# Patient Record
Sex: Female | Born: 1975 | Race: Black or African American | Hispanic: No | Smoking: Current every day smoker
Health system: Southern US, Community
[De-identification: ages and names within clinical notes are randomized; demographics above are authoritative.]

## PROBLEM LIST (undated history)

## (undated) ENCOUNTER — Inpatient Hospital Stay (HOSPITAL_COMMUNITY): Payer: Self-pay

## (undated) DIAGNOSIS — I10 Essential (primary) hypertension: Secondary | ICD-10-CM

## (undated) DIAGNOSIS — O3480 Maternal care for other abnormalities of pelvic organs, unspecified trimester: Secondary | ICD-10-CM

## (undated) DIAGNOSIS — N6009 Solitary cyst of unspecified breast: Secondary | ICD-10-CM

## (undated) DIAGNOSIS — R51 Headache: Secondary | ICD-10-CM

## (undated) DIAGNOSIS — O09299 Supervision of pregnancy with other poor reproductive or obstetric history, unspecified trimester: Secondary | ICD-10-CM

## (undated) DIAGNOSIS — Z975 Presence of (intrauterine) contraceptive device: Secondary | ICD-10-CM

## (undated) DIAGNOSIS — N938 Other specified abnormal uterine and vaginal bleeding: Secondary | ICD-10-CM

## (undated) DIAGNOSIS — Q369 Cleft lip, unilateral: Secondary | ICD-10-CM

## (undated) DIAGNOSIS — B999 Unspecified infectious disease: Secondary | ICD-10-CM

## (undated) DIAGNOSIS — M419 Scoliosis, unspecified: Secondary | ICD-10-CM

## (undated) DIAGNOSIS — D649 Anemia, unspecified: Secondary | ICD-10-CM

## (undated) DIAGNOSIS — O1490 Unspecified pre-eclampsia, unspecified trimester: Secondary | ICD-10-CM

## (undated) DIAGNOSIS — F419 Anxiety disorder, unspecified: Secondary | ICD-10-CM

## (undated) DIAGNOSIS — O139 Gestational [pregnancy-induced] hypertension without significant proteinuria, unspecified trimester: Secondary | ICD-10-CM

## (undated) DIAGNOSIS — R011 Cardiac murmur, unspecified: Secondary | ICD-10-CM

## (undated) DIAGNOSIS — R413 Other amnesia: Secondary | ICD-10-CM

## (undated) DIAGNOSIS — N83209 Unspecified ovarian cyst, unspecified side: Secondary | ICD-10-CM

## (undated) DIAGNOSIS — J45909 Unspecified asthma, uncomplicated: Secondary | ICD-10-CM

## (undated) HISTORY — DX: Presence of (intrauterine) contraceptive device: Z97.5

## (undated) HISTORY — DX: Unspecified infectious disease: B99.9

## (undated) HISTORY — DX: Gestational (pregnancy-induced) hypertension without significant proteinuria, unspecified trimester: O13.9

## (undated) HISTORY — DX: Solitary cyst of unspecified breast: N60.09

## (undated) HISTORY — DX: Other specified abnormal uterine and vaginal bleeding: N93.8

## (undated) HISTORY — DX: Unspecified pre-eclampsia, unspecified trimester: O14.90

## (undated) HISTORY — DX: Essential (primary) hypertension: I10

## (undated) HISTORY — DX: Unspecified asthma, uncomplicated: J45.909

## (undated) HISTORY — DX: Headache: R51

## (undated) HISTORY — DX: Unspecified ovarian cyst, unspecified side: N83.209

## (undated) HISTORY — DX: Other amnesia: R41.3

## (undated) HISTORY — DX: Maternal care for other abnormalities of pelvic organs, unspecified trimester: O34.80

## (undated) HISTORY — DX: Cleft lip, unilateral: Q36.9

## (undated) HISTORY — DX: Supervision of pregnancy with other poor reproductive or obstetric history, unspecified trimester: O09.299

---
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
## 2003-09-30 ENCOUNTER — Emergency Department (HOSPITAL_COMMUNITY): Admission: EM | Admit: 2003-09-30 | Discharge: 2003-09-30 | Payer: Self-pay | Admitting: Emergency Medicine

## 2006-08-29 ENCOUNTER — Emergency Department (HOSPITAL_COMMUNITY): Admission: EM | Admit: 2006-08-29 | Discharge: 2006-08-29 | Payer: Self-pay | Admitting: Emergency Medicine

## 2007-11-23 ENCOUNTER — Emergency Department (HOSPITAL_COMMUNITY): Admission: EM | Admit: 2007-11-23 | Discharge: 2007-11-24 | Payer: Self-pay | Admitting: Emergency Medicine

## 2007-11-25 ENCOUNTER — Emergency Department (HOSPITAL_COMMUNITY): Admission: EM | Admit: 2007-11-25 | Discharge: 2007-11-25 | Payer: Self-pay | Admitting: Emergency Medicine

## 2008-04-05 ENCOUNTER — Emergency Department (HOSPITAL_COMMUNITY): Admission: EM | Admit: 2008-04-05 | Discharge: 2008-04-05 | Payer: Self-pay | Admitting: Emergency Medicine

## 2008-04-08 ENCOUNTER — Emergency Department (HOSPITAL_COMMUNITY): Admission: EM | Admit: 2008-04-08 | Discharge: 2008-04-08 | Payer: Self-pay | Admitting: Family Medicine

## 2009-06-13 ENCOUNTER — Emergency Department (HOSPITAL_COMMUNITY): Admission: EM | Admit: 2009-06-13 | Discharge: 2009-06-13 | Payer: Self-pay | Admitting: Emergency Medicine

## 2010-07-02 ENCOUNTER — Encounter: Payer: Self-pay | Admitting: Surgery

## 2010-08-27 LAB — URINALYSIS, ROUTINE W REFLEX MICROSCOPIC
Bilirubin Urine: NEGATIVE
Leukocytes, UA: NEGATIVE
Nitrite: NEGATIVE
Urobilinogen, UA: 0.2 mg/dL (ref 0.0–1.0)
pH: 5.5 (ref 5.0–8.0)

## 2010-08-27 LAB — WET PREP, GENITAL: Yeast Wet Prep HPF POC: NONE SEEN

## 2010-08-27 LAB — DIFFERENTIAL
Basophils Absolute: 0 10*3/uL (ref 0.0–0.1)
Eosinophils Relative: 0 % (ref 0–5)
Monocytes Absolute: 0.4 10*3/uL (ref 0.1–1.0)
Neutrophils Relative %: 63 % (ref 43–77)

## 2010-08-27 LAB — BASIC METABOLIC PANEL
BUN: 8 mg/dL (ref 6–23)
Calcium: 8.9 mg/dL (ref 8.4–10.5)
Chloride: 106 mEq/L (ref 96–112)
GFR calc Af Amer: >60 mL/min (ref >60)
GFR calc non Af Amer: >60 mL/min (ref >60)
Glucose, Bld: 99 mg/dL (ref 70–99)
Potassium: 3.9 mEq/L (ref 3.5–5.1)

## 2010-08-27 LAB — CBC
Hemoglobin: 15.1 g/dL — ABNORMAL HIGH (ref 12.0–15.0)
Platelets: 226 10*3/uL (ref 150–400)
RBC: 5.02 MIL/uL (ref 3.87–5.11)
WBC: 5.1 10*3/uL (ref 4.0–10.5)

## 2010-08-27 LAB — URINE MICROSCOPIC-ADD ON

## 2010-08-27 LAB — POCT PREGNANCY, URINE: Preg Test, Ur: NEGATIVE

## 2010-10-24 NOTE — Op Note (Signed)
Anne Olson, Anne Olson              ACCOUNT NO.:  [id number]   MEDICAL RECORD NO.:  [id number]          PATIENT TYPE:  EMS   LOCATION:  ED                           FACILITY:  [facility]   PHYSICIAN:  Velora Heckler, MD      DATE OF BIRTH:  08/20/1975   DATE OF PROCEDURE:  04/05/2008  DATE OF DISCHARGE:                               OPERATIVE REPORT   PREOPERATIVE DIAGNOSIS:  Left breast abscess.   POSTOPERATIVE DIAGNOSIS:  Left breast abscess.   PROCEDURE:  Incision and drainage and open packing, left breast abscess.   SURGEON:  Velora Heckler, MD, FACS   ANESTHESIA:  2% lidocaine local.   ESTIMATED BLOOD LOSS:  Minimal.   PREPARATION:  Betadine.   COMPLICATIONS:  None.   INDICATIONS:  The patient is 35 year old black female which presents to  [facility] Emergency Department with 1-week history of left breast  swelling and pain.  The patient was evaluated in the emergency room and  found to have a left breast abscess.  General surgery was called for  management.   BODY OF THE REPORT:  The procedure was done at the bedside in the  emergency department.  The left breast is prepped with Betadine and  draped in the usual aseptic fashion.  Skin was anesthetized with 2%  lidocaine local.  A 1 cm incision is made over the abscess at the  inferior aspect of the left breast areola.  Copious amounts of brown  yellow pus were evacuated from the cavity.  The sample was submitted to  the laboratory for culture and sensitivity.  Cavity is packed with  quarter-inch Iodoform gauze packing and dry gauze dressings were placed  and secured with Hypafix tape.   The patient tolerated the procedure well.      Velora Heckler, MD  Electronically Signed     TMG/MEDQ  D:  04/05/2008  T:  04/05/2008  Job:  [id number]

## 2011-03-08 LAB — POCT I-STAT, CHEM 8
BUN: 3 — ABNORMAL LOW
Calcium, Ion: 1.11 — ABNORMAL LOW
HCT: 43
Potassium: 3.7
Sodium: 143

## 2011-03-08 LAB — POCT CARDIAC MARKERS
Myoglobin, poc: 22.5
Troponin i, poc: <0.05

## 2011-03-12 LAB — POCT I-STAT, CHEM 8
BUN: <3 — ABNORMAL LOW
Calcium, Ion: 1.17
Chloride: 106
Creatinine, Ser: 0.9
Glucose, Bld: 99
Hemoglobin: 15
Sodium: 142
TCO2: 24

## 2011-03-12 LAB — DIFFERENTIAL
Basophils Absolute: 0
Basophils Relative: 0
Eosinophils Absolute: 0
Eosinophils Relative: 0
Monocytes Absolute: 0.5
Monocytes Relative: 5
Neutro Abs: 7.3

## 2011-03-12 LAB — CULTURE, ROUTINE-ABSCESS

## 2011-03-12 LAB — CBC
MCV: 92.3
Platelets: 261
RBC: 4.54

## 2011-05-12 HISTORY — PX: WISDOM TOOTH EXTRACTION: SHX21

## 2011-06-12 DIAGNOSIS — R413 Other amnesia: Secondary | ICD-10-CM

## 2011-06-12 DIAGNOSIS — O3480 Maternal care for other abnormalities of pelvic organs, unspecified trimester: Secondary | ICD-10-CM

## 2011-06-12 DIAGNOSIS — N83209 Unspecified ovarian cyst, unspecified side: Secondary | ICD-10-CM

## 2011-06-12 HISTORY — DX: Maternal care for other abnormalities of pelvic organs, unspecified trimester: O34.80

## 2011-06-12 HISTORY — DX: Unspecified ovarian cyst, unspecified side: N83.209

## 2011-06-12 HISTORY — DX: Other amnesia: R41.3

## 2011-12-27 ENCOUNTER — Inpatient Hospital Stay (HOSPITAL_COMMUNITY)
Admission: AD | Admit: 2011-12-27 | Discharge: 2011-12-28 | Disposition: A | Discharge status: Home / Self Care | Payer: 59 | Source: Ambulatory Visit | Attending: Family Medicine | Admitting: Family Medicine

## 2011-12-27 DIAGNOSIS — N760 Acute vaginitis: Secondary | ICD-10-CM

## 2011-12-27 DIAGNOSIS — O26899 Other specified pregnancy related conditions, unspecified trimester: Secondary | ICD-10-CM

## 2011-12-27 DIAGNOSIS — O34599 Maternal care for other abnormalities of gravid uterus, unspecified trimester: Secondary | ICD-10-CM | POA: Insufficient documentation

## 2011-12-27 DIAGNOSIS — R102 Pelvic and perineal pain unspecified side: Secondary | ICD-10-CM

## 2011-12-27 DIAGNOSIS — O2390 Unspecified genitourinary tract infection in pregnancy, unspecified trimester: Secondary | ICD-10-CM | POA: Insufficient documentation

## 2011-12-27 DIAGNOSIS — B9689 Other specified bacterial agents as the cause of diseases classified elsewhere: Secondary | ICD-10-CM | POA: Insufficient documentation

## 2011-12-27 DIAGNOSIS — N949 Unspecified condition associated with female genital organs and menstrual cycle: Secondary | ICD-10-CM | POA: Insufficient documentation

## 2011-12-27 DIAGNOSIS — R1032 Left lower quadrant pain: Secondary | ICD-10-CM | POA: Insufficient documentation

## 2011-12-27 DIAGNOSIS — A499 Bacterial infection, unspecified: Secondary | ICD-10-CM

## 2011-12-27 DIAGNOSIS — N83209 Unspecified ovarian cyst, unspecified side: Secondary | ICD-10-CM

## 2011-12-27 HISTORY — DX: Anemia, unspecified: D64.9

## 2011-12-27 HISTORY — DX: Cardiac murmur, unspecified: R01.1

## 2011-12-27 HISTORY — DX: Anxiety disorder, unspecified: F41.9

## 2011-12-28 ENCOUNTER — Encounter (HOSPITAL_COMMUNITY): Payer: Self-pay | Admitting: *Deleted

## 2011-12-28 ENCOUNTER — Inpatient Hospital Stay (HOSPITAL_COMMUNITY): Payer: Self-pay

## 2011-12-28 LAB — URINALYSIS, ROUTINE W REFLEX MICROSCOPIC
Glucose, UA: NEGATIVE mg/dL
Ketones, ur: NEGATIVE mg/dL
Nitrite: NEGATIVE
Urobilinogen, UA: 1 mg/dL (ref 0.0–1.0)
pH: 6 (ref 5.0–8.0)

## 2011-12-28 LAB — HCG, QUANTITATIVE, PREGNANCY: hCG, Beta Chain, Quant, S: 9660 m[IU]/mL — ABNORMAL HIGH (ref <5)

## 2011-12-28 LAB — ABO/RH: ABO/RH(D): O POS

## 2011-12-28 LAB — WET PREP, GENITAL: Trich, Wet Prep: NONE SEEN

## 2011-12-28 LAB — CBC WITH DIFFERENTIAL/PLATELET
Eosinophils Relative: 1 % (ref 0–5)
Hemoglobin: 12.4 g/dL (ref 12.0–15.0)
Lymphocytes Relative: 39 % (ref 12–46)
Lymphs Abs: 2.8 10*3/uL (ref 0.7–4.0)
MCV: 88.1 fL (ref 78.0–100.0)
Platelets: 202 10*3/uL (ref 150–400)
RBC: 4.21 MIL/uL (ref 3.87–5.11)
WBC: 7.1 10*3/uL (ref 4.0–10.5)

## 2011-12-28 MED ORDER — ACETAMINOPHEN 325 MG PO TABS
650.0000 mg | ORAL_TABLET | Freq: Once | ORAL | Status: AC
  Administered 2011-12-28: 650 mg via ORAL
  Filled 2011-12-28: qty 2

## 2011-12-28 MED ORDER — METRONIDAZOLE 500 MG PO TABS: 500.0000 mg | ORAL_TABLET | Freq: Two times a day (BID) | ORAL | Status: AC

## 2011-12-28 NOTE — MAU Provider Note (Signed)
History     CSN: [id number]  Arrival date and time: 12/27/11 2358   First Provider Initiated Contact with Patient 12/28/11 0047      Chief Complaint  Patient presents with  . Possible Pregnancy  . Urinary Frequency  . Abdominal Pain   HPI Anne Olson is a 36 y.o. female who presents to MAU for abdominal pain in early pregnancy. The abdominal pain is located in the left lower portion and it radiates to the left lower back. She rates the pain as 5/10. She denies nausea, vomiting or other problems. The history was provided by the patient.  OB History    Grav Para Term Preterm Abortions TAB SAB Ect Mult Living   3 1 1  0 1 1 0 0 0 1      Past Medical History  Diagnosis Date  . Heart murmur   . Anxiety   . Anemia     Past Surgical History  Procedure Date  . No past surgeries     Family History  Problem Relation Age of Onset  . Cancer Mother   . Cancer Father     History  Substance Use Topics  . Smoking status: Current Everyday Smoker -- 0.5 packs/day  . Smokeless tobacco: Not on file  . Alcohol Use: No    Allergies: No Known Allergies  No prescriptions prior to admission    ROS: As stated in HPI    Blood pressure 144/74, pulse 106, temperature 98.8 F (37.1 C), temperature source Oral, resp. rate 20, height 5\' 8"  (1.727 m), weight 120 lb (54.432 kg), last menstrual period 11/21/2011, SpO2 100.00%.  Physical Exam  Nursing note and vitals reviewed. Constitutional: She is oriented to person, place, and time. She appears well-developed and well-nourished. No distress.  HENT:  Head: Normocephalic and atraumatic.  Eyes: EOM are normal.  Neck: Neck supple.  Cardiovascular:       Tachycardia   Respiratory: Effort normal.  GI: Soft. There is tenderness in the left lower quadrant. There is no rigidity, no rebound, no guarding and no CVA tenderness.  Genitourinary:       External genitalia without lesions. Frothy discharge vaginal vault. Cervix long, closed,  no CMT. Left adnexal tenderness. Uterus slightly enlarged.  Musculoskeletal: Normal range of motion. She exhibits no edema.  Neurological: She is alert and oriented to person, place, and time.  Skin: Skin is warm and dry.  Psychiatric: She has a normal mood and affect. Her behavior is normal. Judgment and thought content normal.    Results for orders placed during the hospital encounter of 12/27/11 (from the past 24 hour(s))  URINALYSIS, ROUTINE W REFLEX MICROSCOPIC     Status: Abnormal   Collection Time   12/28/11 12:05 AM      Component Value Range   Color, Urine YELLOW  YELLOW   APPearance CLEAR  CLEAR   Specific Gravity, Urine >1.030 (*) 1.005 - 1.030   pH 6.0  5.0 - 8.0   Glucose, UA NEGATIVE  NEGATIVE mg/dL   Hgb urine dipstick NEGATIVE  NEGATIVE   Bilirubin Urine NEGATIVE  NEGATIVE   Ketones, ur NEGATIVE  NEGATIVE mg/dL   Protein, ur NEGATIVE  NEGATIVE mg/dL   Urobilinogen, UA 1.0  0.0 - 1.0 mg/dL   Nitrite NEGATIVE  NEGATIVE   Leukocytes, UA NEGATIVE  NEGATIVE  POCT PREGNANCY, URINE     Status: Abnormal   Collection Time   12/28/11 12:18 AM      Component Value Range  Preg Test, Ur POSITIVE (*) NEGATIVE  CBC WITH DIFFERENTIAL     Status: Normal   Collection Time   12/28/11 12:55 AM      Component Value Range   WBC 7.1  4.0 - 10.5 K/uL   RBC 4.21  3.87 - 5.11 MIL/uL   Hemoglobin 12.4  12.0 - 15.0 g/dL   HCT 16.1  09.6 - 04.5 %   MCV 88.1  78.0 - 100.0 fL   MCH 29.5  26.0 - 34.0 pg   MCHC 33.4  30.0 - 36.0 g/dL   RDW 40.9  81.1 - 91.4 %   Platelets 202  150 - 400 K/uL   Neutrophils Relative 51  43 - 77 %   Neutro Abs 3.6  1.7 - 7.7 K/uL   Lymphocytes Relative 39  12 - 46 %   Lymphs Abs 2.8  0.7 - 4.0 K/uL   Monocytes Relative 9  3 - 12 %   Monocytes Absolute 0.7  0.1 - 1.0 K/uL   Eosinophils Relative 1  0 - 5 %   Eosinophils Absolute 0.0  0.0 - 0.7 K/uL   Basophils Relative 0  0 - 1 %   Basophils Absolute 0.0  0.0 - 0.1 K/uL  HCG, QUANTITATIVE, PREGNANCY      Status: Abnormal   Collection Time   12/28/11 12:55 AM      Component Value Range   hCG, Beta Chain, Quant, S 9660 (*) <5 mIU/mL  ABO/RH     Status: Normal (Preliminary result)   Collection Time   12/28/11 12:55 AM      Component Value Range   ABO/RH(D) O POS    WET PREP, GENITAL     Status: Abnormal   Collection Time   12/28/11  1:25 AM      Component Value Range   Yeast Wet Prep HPF POC NONE SEEN  NONE SEEN   Trich, Wet Prep NONE SEEN  NONE SEEN   Clue Cells Wet Prep HPF POC MODERATE (*) NONE SEEN   WBC, Wet Prep HPF POC FEW (*) NONE SEEN   Ultrasound shows a 6 week viable [facility]UP [facility]  MAU Course  Procedures  Assessment and Plan  36 y.o. female @ [redacted] week gestation with pelvic pain Bacterial vaginosis Ovarian cyst left  Plan: Rx Flagyl  Tylenol prn pain  Start prenatal care, return as needed. NEESE,HOPE 12/28/2011, 2:00 AM

## 2011-12-28 NOTE — MAU Note (Signed)
Pt reports sharp pains in lower back and left side x 2 days. Positive home preg test x 2. LMP 11/21/2011. Frequent urination.

## 2011-12-28 NOTE — MAU Provider Note (Signed)
Chart reviewed and agree with management and plan.  

## 2011-12-28 NOTE — MAU Note (Signed)
Pt states she took 2 home pregnancy test. Pt complaining of abdominal pain and left side and lower back.

## 2012-01-09 ENCOUNTER — Telehealth: Payer: Self-pay | Admitting: Obstetrics and Gynecology

## 2012-01-09 NOTE — Telephone Encounter (Signed)
Bonnie/triage

## 2012-01-09 NOTE — Telephone Encounter (Signed)
Left msg on pt's voice mail to call back . I need a little more information from pt.

## 2012-01-09 NOTE — Telephone Encounter (Signed)
Spoke with pt rgd msg.pt  stated that has been exposed to shingles at her job. Pt is [redacted] weeks pregnant .told pt would consult with [facility] the midwife and call her back. Pt stated that it would br ok to leave information on pt's voice mail. bt cma

## 2012-01-10 ENCOUNTER — Encounter: Payer: Self-pay | Admitting: Obstetrics and Gynecology

## 2012-01-10 ENCOUNTER — Ambulatory Visit (INDEPENDENT_AMBULATORY_CARE_PROVIDER_SITE_OTHER): Payer: 59 | Admitting: Obstetrics and Gynecology

## 2012-01-10 VITALS — BP 124/56 | Resp 14 | Ht 68.0 in

## 2012-01-10 DIAGNOSIS — Z3490 Encounter for supervision of normal pregnancy, unspecified, unspecified trimester: Secondary | ICD-10-CM | POA: Insufficient documentation

## 2012-01-10 DIAGNOSIS — R5383 Other fatigue: Secondary | ICD-10-CM | POA: Insufficient documentation

## 2012-01-10 DIAGNOSIS — M549 Dorsalgia, unspecified: Secondary | ICD-10-CM | POA: Insufficient documentation

## 2012-01-10 DIAGNOSIS — L299 Pruritus, unspecified: Secondary | ICD-10-CM

## 2012-01-10 DIAGNOSIS — M545 Low back pain: Secondary | ICD-10-CM

## 2012-01-10 DIAGNOSIS — N83209 Unspecified ovarian cyst, unspecified side: Secondary | ICD-10-CM | POA: Insufficient documentation

## 2012-01-10 DIAGNOSIS — B029 Zoster without complications: Secondary | ICD-10-CM

## 2012-01-10 DIAGNOSIS — Z331 Pregnant state, incidental: Secondary | ICD-10-CM

## 2012-01-10 LAB — CBC
Platelets: 254 10*3/uL (ref 150–400)
RBC: 4.31 MIL/uL (ref 3.87–5.11)
WBC: 7.1 10*3/uL (ref 4.0–10.5)

## 2012-01-10 LAB — POCT URINALYSIS DIPSTICK
Glucose, UA: NEGATIVE
Nitrite, UA: NEGATIVE
Spec Grav, UA: 1.015
Urobilinogen, UA: NEGATIVE

## 2012-01-10 NOTE — Progress Notes (Signed)
C/O: Back and side pain since middle of month. OTC meds not working . Positive home preg. test on 12/26/2011. Exposure to shingles .Subjective: Patient reports no problems voiding but has had lt sided abdominal pain Left lower Quadrant and some back pain. Worried about exposure to shingles as she is working with the elderly and she is has been in contact with a patient who has an active outbreak in the past week. The patient has been seen at [facility] 12/27/11 for lower abdominal pain, at that visit she was diagnosed with an Ovarian Cyst and BV and a Positive Pregnancy Test. Patient has not established prenatal care as yet. Complains of fatigue. Objective: I have reviewed patient's vital signs, medications and labs.  General: alert and appears older than stated age. Affect is very flat but denies depression but only admits to stress of long hours at work. Lungs: bilaterally Clear CV: RRR Abdomen: Soft. Tender Left Lower Quadrant  c/w previous diagnosis of ovarian cyst. GU: U/A neg GI: normal and regular. External Genitalia: Normal not irritation or swelling. Piercing noted Right Labia Majora. Speculum examination: Vaginal walls appeared normal. Cx normal. Wet prep: Ph 4.0 no Whiff, TOC  No clue cells. Patient has completed course of Flagyl. Bimanual examination: Adnexa: Normal. Uterus: anteverted, [redacted]wks GA. Rectum: Normal   Assessment/Plan  Ovarian Cyst persists. Pregnant with no established PN Care. No anemia.  Plan: Blood work: CBC, CMP, Varicella titer - today Advised to use melatonin OTC to aid sleep and this may help exhaustion Advised to establish PN Care.   Earl Gala, CNM. 01/10/2012, 7:00 PM

## 2012-01-11 LAB — COMPREHENSIVE METABOLIC PANEL
ALT: 12 U/L (ref 0–35)
CO2: 19 mEq/L (ref 19–32)
Calcium: 9 mg/dL (ref 8.4–10.5)
Chloride: 104 mEq/L (ref 96–112)
Potassium: 3.9 mEq/L (ref 3.5–5.3)
Sodium: 138 mEq/L (ref 135–145)
Total Protein: 7 g/dL (ref 6.0–8.3)

## 2012-01-11 LAB — VARICELLA ZOSTER ANTIBODY, IGG: Varicella IgG: 4.38 {ISR} — ABNORMAL HIGH

## 2012-01-16 ENCOUNTER — Ambulatory Visit (INDEPENDENT_AMBULATORY_CARE_PROVIDER_SITE_OTHER): Payer: 59 | Admitting: Obstetrics and Gynecology

## 2012-01-16 DIAGNOSIS — Z331 Pregnant state, incidental: Secondary | ICD-10-CM

## 2012-01-16 DIAGNOSIS — Z3201 Encounter for pregnancy test, result positive: Secondary | ICD-10-CM

## 2012-01-16 LAB — POCT URINALYSIS DIPSTICK
Bilirubin, UA: NEGATIVE
Glucose, UA: NEGATIVE
Ketones, UA: NEGATIVE
Leukocytes, UA: NEGATIVE
Protein, UA: NEGATIVE
Spec Grav, UA: 1.01

## 2012-01-16 MED ORDER — CONCEPT DHA 53.5-38-1 MG PO CAPS: 1.0000 | ORAL_CAPSULE | Freq: Every day | ORAL | Status: DC

## 2012-01-16 NOTE — Progress Notes (Signed)
NOB interview completed.  Had pt complete a ROI form to get delivery records from delivery done @ [facility] in 1994.  Pt states had to be resuscitated, unsure what happened.  NOB work up sched on Wednesday 01/30/12 @ 1445 w/ ND.

## 2012-01-17 LAB — PRENATAL PANEL VII
Antibody Screen: NEGATIVE
Basophils Relative: 0 % (ref 0–1)
Eosinophils Absolute: 0 10*3/uL (ref 0.0–0.7)
Eosinophils Relative: 0 % (ref 0–5)
Hepatitis B Surface Ag: NEGATIVE
MCH: 29.2 pg (ref 26.0–34.0)
MCHC: 33.2 g/dL (ref 30.0–36.0)
Monocytes Relative: 10 % (ref 3–12)
Neutrophils Relative %: 56 % (ref 43–77)
Platelets: 246 10*3/uL (ref 150–400)

## 2012-01-18 ENCOUNTER — Telehealth: Payer: Self-pay | Admitting: Obstetrics and Gynecology

## 2012-01-18 LAB — HEMOGLOBINOPATHY EVALUATION
Hemoglobin Other: 0 %
Hgb A2 Quant: 2.7 % (ref 2.2–3.2)
Hgb A: 97.3 % (ref 96.8–97.8)

## 2012-01-18 NOTE — Telephone Encounter (Signed)
TC to pt. Was given restrictions letter for work at [employer] interview. Requesting letter to work more than 40 hours. Informed pt will need to discuss with provider at NOB W/U. Pt agreeable.

## 2012-01-30 ENCOUNTER — Encounter: Payer: 59 | Admitting: Obstetrics and Gynecology

## 2012-02-19 ENCOUNTER — Encounter: Payer: 59 | Admitting: Obstetrics and Gynecology

## 2012-03-06 ENCOUNTER — Inpatient Hospital Stay (HOSPITAL_COMMUNITY)
Admission: AD | Admit: 2012-03-06 | Discharge: 2012-03-06 | Disposition: A | Discharge status: Home / Self Care | Payer: 59 | Source: Ambulatory Visit | Attending: Obstetrics and Gynecology | Admitting: Obstetrics and Gynecology

## 2012-03-06 ENCOUNTER — Inpatient Hospital Stay (HOSPITAL_COMMUNITY): Payer: 59

## 2012-03-06 ENCOUNTER — Encounter (HOSPITAL_COMMUNITY): Payer: Self-pay | Admitting: *Deleted

## 2012-03-06 DIAGNOSIS — R109 Unspecified abdominal pain: Secondary | ICD-10-CM | POA: Insufficient documentation

## 2012-03-06 DIAGNOSIS — O99891 Other specified diseases and conditions complicating pregnancy: Secondary | ICD-10-CM | POA: Insufficient documentation

## 2012-03-06 DIAGNOSIS — R0602 Shortness of breath: Secondary | ICD-10-CM | POA: Insufficient documentation

## 2012-03-06 DIAGNOSIS — Z331 Pregnant state, incidental: Secondary | ICD-10-CM

## 2012-03-06 DIAGNOSIS — R079 Chest pain, unspecified: Secondary | ICD-10-CM

## 2012-03-06 DIAGNOSIS — M549 Dorsalgia, unspecified: Secondary | ICD-10-CM | POA: Insufficient documentation

## 2012-03-06 LAB — WET PREP, GENITAL
Trich, Wet Prep: NONE SEEN
Yeast Wet Prep HPF POC: NONE SEEN

## 2012-03-06 LAB — COMPREHENSIVE METABOLIC PANEL
AST: 12 U/L (ref 0–37)
Albumin: 3.1 g/dL — ABNORMAL LOW (ref 3.5–5.2)
Alkaline Phosphatase: 67 U/L (ref 39–117)
Chloride: 103 mEq/L (ref 96–112)
Potassium: 3.4 mEq/L — ABNORMAL LOW (ref 3.5–5.1)
Total Bilirubin: 0.1 mg/dL — ABNORMAL LOW (ref 0.3–1.2)
Total Protein: 6.6 g/dL (ref 6.0–8.3)

## 2012-03-06 LAB — CBC
MCHC: 34.3 g/dL (ref 30.0–36.0)
Platelets: 244 10*3/uL (ref 150–400)
RDW: 13.5 % (ref 11.5–15.5)
WBC: 10.4 10*3/uL (ref 4.0–10.5)

## 2012-03-06 LAB — URINALYSIS, ROUTINE W REFLEX MICROSCOPIC
Glucose, UA: NEGATIVE mg/dL
Leukocytes, UA: NEGATIVE
Nitrite: NEGATIVE
Specific Gravity, Urine: 1.025 (ref 1.005–1.030)
pH: 6 (ref 5.0–8.0)

## 2012-03-06 LAB — URINE MICROSCOPIC-ADD ON

## 2012-03-06 MED ORDER — CYCLOBENZAPRINE HCL 10 MG PO TABS: 10.0000 mg | ORAL_TABLET | Freq: Three times a day (TID) | ORAL | Status: DC | PRN

## 2012-03-06 MED ORDER — POTASSIUM CHLORIDE ER 10 MEQ PO TBCR: 20.0000 meq | EXTENDED_RELEASE_TABLET | Freq: Every day | ORAL | Status: DC

## 2012-03-06 MED ORDER — FERRALET 90 90-1 MG PO TABS: 1.0000 | ORAL_TABLET | Freq: Two times a day (BID) | ORAL | Status: DC

## 2012-03-06 MED ORDER — HYDROXYZINE PAMOATE 50 MG PO CAPS: 50.0000 mg | ORAL_CAPSULE | Freq: Four times a day (QID) | ORAL | Status: DC | PRN

## 2012-03-06 MED ORDER — IBUPROFEN 200 MG PO TABS: 600.0000 mg | ORAL_TABLET | Freq: Four times a day (QID) | ORAL | Status: DC | PRN

## 2012-03-06 MED ORDER — IBUPROFEN 600 MG PO TABS
600.0000 mg | ORAL_TABLET | Freq: Once | ORAL | Status: AC
  Administered 2012-03-06: 600 mg via ORAL
  Filled 2012-03-06: qty 1

## 2012-03-06 NOTE — MAU Note (Signed)
Pt states she is concerned rt sided cramping-present x 2 days-also feels short of breath and has chest discomfort under her left breast

## 2012-03-06 NOTE — MAU Provider Note (Signed)
History     CSN: [id number]  Arrival date and time: 03/06/12 0025   First Provider Initiated Contact with Patient 03/06/12 0232      Chief Complaint  Patient presents with  . Abdominal Cramping   HPI Comments: Pt is a 36yo G3P1 at [redacted]w[redacted]d by LMP and 10wk [place], presents unannounced w c/o worsening SOB, LUQ pain, and pelvic cramping. She states she's had it for a few days but it has been worse tonight, has difficulty laying down (feels heart racing and more SOB), denies any VB, LOF, or d/c. She also c/o "back spasms" at different spots.  She states she doesn't eat much, and has had a "few glasses of water today" States she's not tried any OTC tx's  She reports having a hx of asthma, but has "not needed inhaler in years"  Does admit to smoking 1 PPD Does report hx of anxiety, had taken xanax in the past She was seen for NOB interview approx 6wks ago, but has not had NOB w/u.   Abdominal Cramping Pertinent negatives include no constipation, diarrhea, dysuria, headaches, nausea or vomiting.      Past Medical History  Diagnosis Date  . Heart murmur   . Infection     Yeast inf;gets freq w/ antibxs or scented soaps  . Infection     BV;recently treated for BV completed Flagyl  . Anemia     iron supplements in the past  . Ovarian cyst in pregnancy 2013    seen on recent US  . Benign breast cyst in female 1995    had bx done was normal  . DUB (dysfunctional uterine bleeding) 1995  . Asthma     has albuterol, advair prn, no attack x 2 years;triggered by strong smells (bleach and smoke)  . Anxiety     has been prescribed Xanax  . Preeclampsia 1994    induced @ 38 weeks  . Headache     during and prior to pregnancy;usually Rt side of head;can effect vision  . 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 Memory loss 2013    Currently having difficult time remembering things  . Complication of anesthesia     tried getting epidural w/ first delivery, d/t scoliosis,  epidural  was difficult to insert and was not done    Past Surgical History  Procedure Date  . No past surgeries   . Wisdom tooth extraction 05/2011    all 4 removed    Family History  Problem Relation Age of Onset  . Cancer Mother     Throat  . Cancer Father     Throat  . Hypertension Father   . Hypertension Paternal Grandmother   . Hypertension Paternal Aunt   . Diabetes Maternal Aunt   . Asthma Sister   . Asthma Brother   . Asthma Cousin   . Asthma Paternal Aunt   . Heart failure Paternal Grandmother   . Thyroid disease Father     Hyperthyoid  . Seizures Mother   . Stroke Maternal Uncle   . Stroke Paternal Uncle     x 2  . Ulcers Mother   . Pancreatitis Mother   . Pancreatitis Father   . COPD Father   . Cirrhosis Father     Liver  . Sickle cell trait Other     Nephew    History  Substance Use Topics  . Smoking status: Current Every Day Smoker -- 0.5  packs/day    Types: Cigarettes  . Smokeless tobacco: Never Used  . Alcohol Use: Yes     Occasionally    Allergies: No Known Allergies  Prescriptions prior to admission  Medication Sig Dispense Refill  . metroNIDAZOLE (FLAGYL) 500 MG tablet Take 500 mg by mouth 2 (two) times daily.      . Prenat-FeFum-FePo-FA-Omega 3 (CONCEPT DHA) 53.5-38-1 MG CAPS Take 1 capsule by mouth daily.  30 capsule  11  . Prenatal Vit-Fe Fumarate-FA (MULTIVITAMIN-PRENATAL) 27-0.8 MG TABS Take 1 tablet by mouth daily.        Review of Systems  Constitutional: Positive for malaise/fatigue.  HENT: Negative for congestion.   Eyes: Negative for blurred vision.  Respiratory: Positive for shortness of breath. Negative for cough, hemoptysis, sputum production and wheezing.   Cardiovascular: Positive for palpitations and orthopnea. Negative for chest pain and leg swelling.  Gastrointestinal: Positive for heartburn and abdominal pain. Negative for nausea, vomiting, diarrhea and constipation.  Genitourinary: Negative for dysuria and flank  pain.  Musculoskeletal: Positive for back pain.  Skin: Positive for rash.       Itchy rash on thighs/back/buttocks, comes and goes x2 weeks   Neurological: Positive for weakness. Negative for dizziness and headaches.  Psychiatric/Behavioral: Negative for depression. The patient is nervous/anxious and has insomnia.   All other systems reviewed and are negative.   Physical Exam   Blood pressure 138/75, pulse 108, temperature 97.9 F (36.6 C), temperature source Oral, resp. rate 20, height 5' 8.5" (1.74 m), weight 127 lb 4 oz (57.72 kg), last menstrual period 11/21/2011, SpO2 100.00%.  Physical Exam  Nursing note and vitals reviewed. Constitutional: She is oriented to person, place, and time. She appears well-developed and well-nourished.  HENT:  Head: Normocephalic.  Eyes: Pupils are equal, round, and reactive to light.  Neck: Normal range of motion.  Cardiovascular: Normal rate, regular rhythm and normal heart sounds.   Respiratory: Breath sounds normal. Accessory muscle usage present. Tachypnea noted. No respiratory distress. She has no wheezes. She has no rales. She exhibits no tenderness.  GI: Soft. Bowel sounds are normal. She exhibits no distension. There is no tenderness. There is no rebound and no guarding.  Genitourinary: Vagina normal.       Cx=CTH   Musculoskeletal: Normal range of motion. She exhibits no edema.  Neurological: She is alert and oriented to person, place, and time. She has normal reflexes.  Skin: Skin is warm and dry.  Psychiatric: Her behavior is normal. Thought content normal.       Pt has depressed affect     Routine OB labs rv'd and are WNL Results for orders placed during the hospital encounter of 03/06/12 (from the past 24 hour(s))  URINALYSIS, ROUTINE W REFLEX MICROSCOPIC     Status: Abnormal   Collection Time   03/06/12 12:00 AM      Component Value Range   Color, Urine YELLOW  YELLOW   APPearance CLEAR  CLEAR   Specific Gravity, Urine 1.025   1.005 - 1.030   pH 6.0  5.0 - 8.0   Glucose, UA NEGATIVE  NEGATIVE mg/dL   Hgb urine dipstick SMALL (*) NEGATIVE   Bilirubin Urine NEGATIVE  NEGATIVE   Ketones, ur NEGATIVE  NEGATIVE mg/dL   Protein, ur NEGATIVE  NEGATIVE mg/dL   Urobilinogen, UA 1.0  0.0 - 1.0 mg/dL   Nitrite NEGATIVE  NEGATIVE   Leukocytes, UA NEGATIVE  NEGATIVE  URINE MICROSCOPIC-ADD ON     Status: Normal  Collection Time   03/06/12 12:00 AM      Component Value Range   Squamous Epithelial / LPF RARE  RARE   RBC / HPF 3-6  <3 RBC/hpf   Urine-Other MUCOUS PRESENT    WET PREP, GENITAL     Status: Abnormal   Collection Time   03/06/12  3:05 AM      Component Value Range   Yeast Wet Prep HPF POC NONE SEEN  NONE SEEN   Trich, Wet Prep NONE SEEN  NONE SEEN   Clue Cells Wet Prep HPF POC NONE SEEN  NONE SEEN   WBC, Wet Prep HPF POC FEW (*) NONE SEEN     MAU Course  Procedures    Assessment and Plan  IUP at 15wks SOB ?related to anxiety Back pain and LUQ pain ?related to accessory muscle use Rash unk origin   Will check CBC, CMET Chest xray OB US Wet prep and cx sent UA +sm hgb and few WBC, will send for cx PO hydration  Motrin 600mg  PO     Milca Sytsma M 03/06/2012, 3:22 AM   Addendum: 0510am  Results for orders placed during the hospital encounter of 03/06/12 (from the past 24 hour(s))  URINALYSIS, ROUTINE W REFLEX MICROSCOPIC     Status: Abnormal   Collection Time   03/06/12 12:00 AM      Component Value Range   Color, Urine YELLOW  YELLOW   APPearance CLEAR  CLEAR   Specific Gravity, Urine 1.025  1.005 - 1.030   pH 6.0  5.0 - 8.0   Glucose, UA NEGATIVE  NEGATIVE mg/dL   Hgb urine dipstick SMALL (*) NEGATIVE   Bilirubin Urine NEGATIVE  NEGATIVE   Ketones, ur NEGATIVE  NEGATIVE mg/dL   Protein, ur NEGATIVE  NEGATIVE mg/dL   Urobilinogen, UA 1.0  0.0 - 1.0 mg/dL   Nitrite NEGATIVE  NEGATIVE   Leukocytes, UA NEGATIVE  NEGATIVE  URINE MICROSCOPIC-ADD ON     Status: Normal   Collection  Time   03/06/12 12:00 AM      Component Value Range   Squamous Epithelial / LPF RARE  RARE   RBC / HPF 3-6  <3 RBC/hpf   Urine-Other MUCOUS PRESENT    WET PREP, GENITAL     Status: Abnormal   Collection Time   03/06/12  3:05 AM      Component Value Range   Yeast Wet Prep HPF POC NONE SEEN  NONE SEEN   Trich, Wet Prep NONE SEEN  NONE SEEN   Clue Cells Wet Prep HPF POC NONE SEEN  NONE SEEN   WBC, Wet Prep HPF POC FEW (*) NONE SEEN  CBC     Status: Abnormal   Collection Time   03/06/12  3:30 AM      Component Value Range   WBC 10.4  4.0 - 10.5 K/uL   RBC 3.54 (*) 3.87 - 5.11 MIL/uL   Hemoglobin 10.6 (*) 12.0 - 15.0 g/dL   HCT 09.8 (*) 11.9 - 14.7 %   MCV 87.3  78.0 - 100.0 fL   MCH 29.9  26.0 - 34.0 pg   MCHC 34.3  30.0 - 36.0 g/dL   RDW 82.9  56.2 - 13.0 %   Platelets 244  150 - 400 K/uL  COMPREHENSIVE METABOLIC PANEL     Status: Abnormal   Collection Time   03/06/12  3:30 AM      Component Value Range   Sodium 135  135 -  145 mEq/L   Potassium 3.4 (*) 3.5 - 5.1 mEq/L   Chloride 103  96 - 112 mEq/L   CO2 23  19 - 32 mEq/L   Glucose, Bld 88  70 - 99 mg/dL   BUN 7  6 - 23 mg/dL   Creatinine, Ser 9.60  0.50 - 1.10 mg/dL   Calcium 9.1  8.4 - 45.4 mg/dL   Total Protein 6.6  6.0 - 8.3 g/dL   Albumin 3.1 (*) 3.5 - 5.2 g/dL   AST 12  0 - 37 U/L   ALT 8  0 - 35 U/L   Alkaline Phosphatase 67  39 - 117 U/L   Total Bilirubin 0.1 (*) 0.3 - 1.2 mg/dL   GFR calc non Af Amer >90  >90 mL/min   GFR calc Af Amer >90  >90 mL/min   US WNL Chest xray essentially normal - Subsegmental  atelectasis is present in the lingula.    Urine sent for culture RX: motrin, vistaril, flexeril, FE, K+ rv'd nutrition and hydration  Pt reassured  Enc quit smoking, healthy habits F/U in office ASAP for NOB w/u  Will refer to pulmonology  Return or call office with worsening symptoms  rv'd w Dr Budd Palmer.Leeann Must, CNM

## 2012-03-07 LAB — GC/CHLAMYDIA PROBE AMP, GENITAL
Chlamydia, DNA Probe: NEGATIVE
GC Probe Amp, Genital: NEGATIVE

## 2012-03-07 LAB — URINE CULTURE

## 2012-03-12 ENCOUNTER — Encounter: Payer: Self-pay | Admitting: Obstetrics and Gynecology

## 2012-03-12 DIAGNOSIS — F419 Anxiety disorder, unspecified: Secondary | ICD-10-CM | POA: Insufficient documentation

## 2012-03-12 DIAGNOSIS — O09299 Supervision of pregnancy with other poor reproductive or obstetric history, unspecified trimester: Secondary | ICD-10-CM

## 2012-03-12 DIAGNOSIS — N760 Acute vaginitis: Secondary | ICD-10-CM

## 2012-03-12 DIAGNOSIS — J45909 Unspecified asthma, uncomplicated: Secondary | ICD-10-CM | POA: Insufficient documentation

## 2012-03-12 DIAGNOSIS — F17200 Nicotine dependence, unspecified, uncomplicated: Secondary | ICD-10-CM | POA: Insufficient documentation

## 2012-03-12 DIAGNOSIS — B9689 Other specified bacterial agents as the cause of diseases classified elsewhere: Secondary | ICD-10-CM | POA: Insufficient documentation

## 2012-03-12 DIAGNOSIS — O0930 Supervision of pregnancy with insufficient antenatal care, unspecified trimester: Secondary | ICD-10-CM | POA: Insufficient documentation

## 2012-03-12 HISTORY — DX: Supervision of pregnancy with other poor reproductive or obstetric history, unspecified trimester: O09.299

## 2012-03-27 ENCOUNTER — Telehealth: Payer: Self-pay | Admitting: Obstetrics and Gynecology

## 2012-03-27 NOTE — Telephone Encounter (Signed)
Spoke with pt rgd msg pt states she was returning some ones call advised pt no note in chart in rgd who called her pt states will listen to vmail and see who called

## 2012-03-31 ENCOUNTER — Telehealth: Payer: Self-pay | Admitting: Obstetrics and Gynecology

## 2012-03-31 NOTE — Telephone Encounter (Signed)
Medical records

## 2012-04-04 ENCOUNTER — Encounter: Payer: 59 | Admitting: Obstetrics and Gynecology

## 2012-04-22 ENCOUNTER — Telehealth: Payer: Self-pay | Admitting: Obstetrics and Gynecology

## 2012-04-22 NOTE — Telephone Encounter (Signed)
Tc to pt per telephone call. Pt unsure what ins will pay for;however would like a rx for PNV's in a capsule form. Informed pt no capsules available for this pt's ins per EPIC system;only tablets. Pt declines tablets due to making her sick. Pt will cb if desires to have a rx called to pharm. Pt informed to contact ins co to see if capsule PNV's are covered. Pt voices understanding.

## 2012-04-28 ENCOUNTER — Telehealth: Payer: Self-pay | Admitting: Obstetrics and Gynecology

## 2012-04-28 ENCOUNTER — Other Ambulatory Visit: Payer: Self-pay | Admitting: Obstetrics and Gynecology

## 2012-04-28 MED ORDER — ONDANSETRON 4 MG PO TBDP: 4.0000 mg | ORAL_TABLET | Freq: Three times a day (TID) | ORAL | Status: DC | PRN

## 2012-04-28 NOTE — Telephone Encounter (Signed)
TC from pt. States has had diarrhea and nausea since 04/27/12. Vomiting started today. Able to retain water. Also having lt sided abd pain, back pain and now is starting on rt side. +Headache. T-?. Taking Tylenol. Was exposed at work to employees with same sx. +FM. No mestrual-like cramping. Pt states feels as though could retain oral med. Will not be able to obtain until this PM. Per VL, Rx for Zofran. To continue with clear fluids and increase as much as tolerated.  Clear liquids x 24 hr.  If sx increase or no improvement this PM or by 04/29/12, to call.  Pt verbalizes comprehension.

## 2012-04-29 DIAGNOSIS — Q369 Cleft lip, unilateral: Secondary | ICD-10-CM | POA: Insufficient documentation

## 2012-04-29 DIAGNOSIS — M419 Scoliosis, unspecified: Secondary | ICD-10-CM | POA: Insufficient documentation

## 2012-04-29 HISTORY — DX: Cleft lip, unilateral: Q36.9

## 2012-04-30 ENCOUNTER — Encounter: Payer: Self-pay | Admitting: Obstetrics and Gynecology

## 2012-04-30 ENCOUNTER — Ambulatory Visit (INDEPENDENT_AMBULATORY_CARE_PROVIDER_SITE_OTHER): Payer: Medicaid Other | Admitting: Obstetrics and Gynecology

## 2012-04-30 VITALS — BP 112/68 | Wt 130.0 lb

## 2012-04-30 DIAGNOSIS — J45909 Unspecified asthma, uncomplicated: Secondary | ICD-10-CM

## 2012-04-30 DIAGNOSIS — M419 Scoliosis, unspecified: Secondary | ICD-10-CM

## 2012-04-30 DIAGNOSIS — Q369 Cleft lip, unilateral: Secondary | ICD-10-CM

## 2012-04-30 DIAGNOSIS — Z124 Encounter for screening for malignant neoplasm of cervix: Secondary | ICD-10-CM

## 2012-04-30 DIAGNOSIS — M4120 Other idiopathic scoliosis, site unspecified: Secondary | ICD-10-CM

## 2012-04-30 DIAGNOSIS — R21 Rash and other nonspecific skin eruption: Secondary | ICD-10-CM

## 2012-04-30 DIAGNOSIS — Z331 Pregnant state, incidental: Secondary | ICD-10-CM

## 2012-04-30 DIAGNOSIS — Z23 Encounter for immunization: Secondary | ICD-10-CM

## 2012-04-30 LAB — THYROID PANEL WITH TSH
Free Thyroxine Index: 2.7 (ref 1.0–3.9)
T4, Total: 12.9 ug/dL — ABNORMAL HIGH (ref 5.0–12.5)
TSH: 0.769 u[IU]/mL (ref 0.350–4.500)

## 2012-04-30 LAB — CBC WITH DIFFERENTIAL/PLATELET
Basophils Absolute: 0 10*3/uL (ref 0.0–0.1)
Eosinophils Absolute: 0 10*3/uL (ref 0.0–0.7)
Eosinophils Relative: 0 % (ref 0–5)
HCT: 35.7 % — ABNORMAL LOW (ref 36.0–46.0)
Lymphs Abs: 2.6 10*3/uL (ref 0.7–4.0)
MCH: 30.2 pg (ref 26.0–34.0)
MCV: 89.9 fL (ref 78.0–100.0)
Monocytes Absolute: 0.9 10*3/uL (ref 0.1–1.0)
Platelets: 305 10*3/uL (ref 150–400)
RDW: 13.5 % (ref 11.5–15.5)

## 2012-04-30 LAB — POCT WET PREP (WET MOUNT): Clue Cells Wet Prep Whiff POC: NEGATIVE

## 2012-04-30 LAB — COMPREHENSIVE METABOLIC PANEL
ALT: <8 U/L (ref 0–35)
BUN: 4 mg/dL — ABNORMAL LOW (ref 6–23)
CO2: 22 mEq/L (ref 19–32)
Calcium: 9 mg/dL (ref 8.4–10.5)
Creat: 0.44 mg/dL — ABNORMAL LOW (ref 0.50–1.10)
Total Bilirubin: 0.3 mg/dL (ref 0.3–1.2)

## 2012-04-30 MED ORDER — ALBUTEROL SULFATE HFA 108 (90 BASE) MCG/ACT IN AERS: 2.0000 | INHALATION_SPRAY | RESPIRATORY_TRACT | Status: DC | PRN

## 2012-04-30 MED ORDER — TRIAMCINOLONE ACETONIDE 0.025 % EX OINT: TOPICAL_OINTMENT | Freq: Two times a day (BID) | CUTANEOUS | Status: DC

## 2012-04-30 NOTE — Progress Notes (Signed)
   Anne Olson is being seen today for her first obstetrical visit at 102w1d gestation by LMP and prior US.  Had several visits scheduled after NOB that were cancelled--last visit here 03/06/12.  She reports multiple issues: HAs Fatigue Stress with work and unexpected pregnancy Asthma, with SOB--had MAU visit 03/06/12, with Xray showing "subsegmental  Atelectasis present in the lingula".  Was to have had a pulmonary referral--not done yet.  Had hypokalemia at MAU visit 03/06/12. Some type of event at last delivery, "required resuscitation"--occurred at [facility] Early onset of pre-eclampsia around 20 weeks. Placed on BR early BP, on meds. Induced for pre-eclampsia  Delivered by forceps--hospitalized 2 weeks after delivery for BP.     Her obstetrical history is significant for: Patient Active Problem List  Diagnosis  . Back pain  . Fatigue  . Ovarian cyst  . Asthma  . Hx of preeclampsia, prior pregnancy, currently pregnant  . Bacterial vaginosis  . Smoker  . Anxiety  . Insufficient prenatal care  . Family hx cleft lip  . Scoliosis    Relationship with FOB:  Jethro Bolus of involvement She is employed as Scientist, clinical (histocompatibility and immunogenetics) at [employer].   Feeding plan:   Breast/bottle  Pregnancy history fully reviewed.  The following portions of the patient's history were reviewed and updated as appropriate: allergies, current medications, past family history, past medical history, past social history, past surgical history and problem list.  Review of Systems Pertinent ROS is described in HPI   Objective:   BP 112/68  Wt 130 lb (58.968 kg)  LMP 11/21/2011 Wt Readings from Last 1 Encounters:  04/30/12 130 lb (58.968 kg)   BMI: There is no height on file to calculate BMI.  General: alert, cooperative and no distress HEENT: grossly normal  Thyroid: normal  Respiratory: clear to auscultation bilaterally--slightly tachypneic, but no wheezes,  etc. Cardiovascular: regular rate and rhythm,  Breasts:  No dominant masses, nipples erect Gastrointestinal: soft, non-tender; no masses,  no organomegaly Extremities: extremities normal, no pain or edema Vaginal Bleeding: None  EXTERNAL GENITALIA: normal appearing vulva with no masses, tenderness or lesions VAGINA: no abnormal discharge or lesions CERVIX: no lesions or cervical motion tenderness; cervix closed, long, firm UTERUS: gravid and consistent with 23 weeks ADNEXA: no masses palpable and nontender OB EXAM PELVIMETRY: appears adequate  FHR:  150  bpm  Assessment:    Pregnancy at 23 1/7 weeks AMA Chronic HAs Asthma Hx early pre-eclampsia with previous pregnancy Anesthesia event with last delivery  Fatigue Hx hypokalemia  Plan:     Prenatal panel reviewed and discussed with the patient:  yes  Pap smear collected:  yes GC/Chlamydia collected:  yes Wet prep:  Negative Discussion of Genetic testing options: Wants [facility] Prenatal vitamins recommended CBC, diff, CMP, thyroid panel with TSH today ROI to [facility] for previous birth records.  Plan of care: Schedule [place] for anatomy ASAP Next visit: 1-2 weeks for anatomy US Other anticipated f/u:    Refer to pulmonologist  Nigel Bridgeman, CNM

## 2012-04-30 NOTE — Progress Notes (Signed)
[redacted]w[redacted]d   Pt has constant headaches, lower side and back pain x 1 month  Pt will receive flu vaccine today

## 2012-05-01 ENCOUNTER — Telehealth: Payer: Self-pay | Admitting: Obstetrics and Gynecology

## 2012-05-01 NOTE — Telephone Encounter (Signed)
Tc to pt regarding msg below.  Tc to [facility], scheduled an appt for pt on Wednesday 05/07/12 @ 1330 w/ Dr. Shona Simpson.  Tc to pt to make aware.  Lm on vm to all back.

## 2012-05-01 NOTE — Telephone Encounter (Signed)
Message copied by Delon Sacramento on Thu May 01, 2012  3:42 PM ------      Message from: Cornelius Moras      Created: Wed Apr 30, 2012 12:13 PM      Regarding: Pulmonary referrral       Need pulmonary referral ASAP--hx asthma, abnormal chest xray during pregnancy, with atelectasis noted.  SOB.  Now 23 weeks.            Thanks!      VL

## 2012-05-02 ENCOUNTER — Telehealth: Payer: Self-pay | Admitting: Obstetrics and Gynecology

## 2012-05-02 LAB — PAP IG, CT-NG, RFX HPV ASCU

## 2012-05-02 NOTE — Telephone Encounter (Signed)
Pt returned call, informed pt of appt @ [facility] on Wednesday 05/07/12 @ 1330 w/ Dr. Sherene Sires, pt voices agreement.  Pt given telephone number for directions and if needs to reschedule.

## 2012-05-02 NOTE — Telephone Encounter (Signed)
Message copied by Delon Sacramento on Fri May 02, 2012 10:06 AM ------      Message from: Cornelius Moras      Created: Wed Apr 30, 2012 12:13 PM      Regarding: Pulmonary referrral       Need pulmonary referral ASAP--hx asthma, abnormal chest xray during pregnancy, with atelectasis noted.  SOB.  Now 23 weeks.            Thanks!      VL

## 2012-05-05 ENCOUNTER — Encounter: Payer: 59 | Admitting: Obstetrics and Gynecology

## 2012-05-05 ENCOUNTER — Other Ambulatory Visit: Payer: 59

## 2012-05-07 ENCOUNTER — Institutional Professional Consult (permissible substitution): Payer: 59 | Admitting: Internal Medicine

## 2012-05-07 NOTE — Progress Notes (Signed)
Per review of notes from [facility] (reviewed 05/07/12, received 05/02/12): Delivered at age 36 BP elevated at 33-34 weeks, dx as mild pre-eclampsia, but I see no 24 hour urine. Low forceps delivery, no rationale noted. No documentation of any issues during delivery (patient had reported needed "resuscitation".) Induced at 38 weeks for mild pre-eclampsia Newborn weighed 2625 gms

## 2012-05-09 ENCOUNTER — Encounter: Payer: Self-pay | Admitting: Obstetrics and Gynecology

## 2012-05-09 DIAGNOSIS — O09529 Supervision of elderly multigravida, unspecified trimester: Secondary | ICD-10-CM | POA: Insufficient documentation

## 2012-05-09 LAB — HARMONY PRENATAL TEST: FETAL CFDNA PERCENTAGE: 26.7

## 2012-05-12 ENCOUNTER — Ambulatory Visit (INDEPENDENT_AMBULATORY_CARE_PROVIDER_SITE_OTHER): Payer: Medicaid Other | Admitting: Obstetrics and Gynecology

## 2012-05-12 ENCOUNTER — Ambulatory Visit (INDEPENDENT_AMBULATORY_CARE_PROVIDER_SITE_OTHER): Payer: Medicaid Other

## 2012-05-12 ENCOUNTER — Telehealth: Payer: Self-pay | Admitting: Obstetrics and Gynecology

## 2012-05-12 VITALS — BP 100/58 | Wt 132.0 lb

## 2012-05-12 DIAGNOSIS — Z3689 Encounter for other specified antenatal screening: Secondary | ICD-10-CM

## 2012-05-12 DIAGNOSIS — N9089 Other specified noninflammatory disorders of vulva and perineum: Secondary | ICD-10-CM

## 2012-05-12 DIAGNOSIS — Z331 Pregnant state, incidental: Secondary | ICD-10-CM

## 2012-05-12 DIAGNOSIS — B009 Herpesviral infection, unspecified: Secondary | ICD-10-CM

## 2012-05-12 MED ORDER — VALACYCLOVIR HCL 500 MG PO TABS: 1000.0000 mg | ORAL_TABLET | Freq: Every day | ORAL | Status: DC

## 2012-05-12 NOTE — Telephone Encounter (Signed)
Tc to pt regarding msg per VL.  Lm on vm to call back. 

## 2012-05-12 NOTE — Progress Notes (Signed)
[redacted]w[redacted]d Ultrasound: Single gestation, normal fluid, normal anatomy, female infant, cervix 3.53 cm, 25 weeks and 4 days (76 percentile). 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 Herpes serology today. Return to office in 4 weeks. Dr. Stefano Gaul

## 2012-05-12 NOTE — Telephone Encounter (Signed)
Pt returned call, informed of VL's comments below, pt voices agreement.  Pt will keep appt that she had today for [place] and visit w/ AVS.

## 2012-05-12 NOTE — Progress Notes (Signed)
[redacted]w[redacted]d  Pt states she believes she is getting a "boil" on her Left Buttocks that has not come to a head But it is painful to sit and touch

## 2012-05-12 NOTE — Telephone Encounter (Signed)
Message copied by Delon Sacramento on Mon May 12, 2012  9:59 AM ------      Message from: Cornelius Moras      Created: Fri May 09, 2012  6:16 PM      Regarding: Cathlean Sauer result       Please notify patient of normal test results and of the plan for any abnormal results.      Normal:  [place]      Abnormal: none      Plan:   Keep scheduled visit            Thanks!      VL

## 2012-05-13 LAB — HSV 1 ANTIBODY, IGG: HSV 1 Glycoprotein G Ab, IgG: 9.86 IV — ABNORMAL HIGH

## 2012-05-13 LAB — HSV 2 ANTIBODY, IGG: HSV 2 Glycoprotein G Ab, IgG: 1.63 IV — ABNORMAL HIGH

## 2012-05-14 ENCOUNTER — Telehealth: Payer: Self-pay | Admitting: Obstetrics and Gynecology

## 2012-05-14 NOTE — Telephone Encounter (Signed)
The patient called.  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  Implications for vaginal delivery and cesarean section were outlined.  She was told to discuss further at her next appointment.  Dr. Stefano Gaul

## 2012-05-15 LAB — US OB COMP + 14 WK

## 2012-05-16 ENCOUNTER — Ambulatory Visit (INDEPENDENT_AMBULATORY_CARE_PROVIDER_SITE_OTHER): Payer: Medicaid Other | Admitting: Internal Medicine

## 2012-05-16 ENCOUNTER — Encounter: Payer: Self-pay | Admitting: Internal Medicine

## 2012-05-16 VITALS — BP 110/72 | HR 127 | Temp 98.2°F | Ht 68.0 in | Wt 136.0 lb

## 2012-05-16 DIAGNOSIS — R9389 Abnormal findings on diagnostic imaging of other specified body structures: Secondary | ICD-10-CM | POA: Insufficient documentation

## 2012-05-16 DIAGNOSIS — R0600 Dyspnea, unspecified: Secondary | ICD-10-CM

## 2012-05-16 DIAGNOSIS — R0989 Other specified symptoms and signs involving the circulatory and respiratory systems: Secondary | ICD-10-CM

## 2012-05-16 DIAGNOSIS — J45909 Unspecified asthma, uncomplicated: Secondary | ICD-10-CM

## 2012-05-16 DIAGNOSIS — R918 Other nonspecific abnormal finding of lung field: Secondary | ICD-10-CM

## 2012-05-16 DIAGNOSIS — F17200 Nicotine dependence, unspecified, uncomplicated: Secondary | ICD-10-CM

## 2012-05-16 MED ORDER — BUDESONIDE-FORMOTEROL FUMARATE 160-4.5 MCG/ACT IN AERO: INHALATION_SPRAY | RESPIRATORY_TRACT | Status: DC

## 2012-05-16 NOTE — Assessment & Plan Note (Addendum)
-   05/16/2012   Walked RA x one lap @ 185 stopped due to  Fatigue with 02 sats still 100%  Symptoms are distinct from her asthma attacks and not reproducible here/  markedly disproportionate to objective findings and not clear this is a lung problem but pt does appear to have difficult airway management issues.  DDX of  difficult airways managment all start with A and  include Adherence, Ace Inhibitors, Acid Reflux, Active Sinus Disease, Alpha 1 Antitripsin deficiency, Anxiety masquerading as Airways dz,  ABPA,  allergy(esp in young), Aspiration (esp in elderly), Adverse effects of DPI,  Active smokers, plus two Bs  = Bronchiectasis and Beta blocker use..and one C= CHF  ? Acid reflux > pos risk with pregnancy  ? chf > nothing to suggest this  ? Anxiety/ hyperventilation related to progesterone levels > typically a dx of exclusion but the leading suspect here.

## 2012-05-16 NOTE — Progress Notes (Signed)
  Subjective:    Patient ID: Anne Olson, female    DOB: Jul 11, 1975  MRN: [id number]  HPI  30 yobf smoker dx of asthma as teenager before started smoking, then 1994 pregancy / asthma then admitted to [facility] (only time admitted) w/in one year post partum x 2 weeks with severe asthma, never since, referred by Nigel Bridgeman for evaluation of sob during pregnancy.   05/16/2012 1st pulmonary eval still  smoking @ [redacted] weeks gestation cc sob x across the room onset was w/in first 10 weeks of learning she was pregnant, pattern different as  no better p albuterol,  Her asthma symptoms are typically  Some better with advair, lots better p nebulizer but doesn't have one - last ER 6 months.  Typical triggers for asthma spells are strong fumes > tightness, better with proventil, needs to use daily when goes work.  No obvious daytime variabilty or assoc chronic cough or cp or chest tightness, subjective wheeze overt sinus or hb symptoms. No unusual exp hx or h/o childhood pna/ asthma or premature birth to her knowledge.   ROS  The following are not active complaints unless bolded sore throat, dysphagia, dental problems, itching, sneezing,  nasal congestion or excess/ purulent secretions, ear ache,   fever, chills, sweats, unintended wt loss, pleuritic or exertional cp, hemoptysis,  orthopnea pnd or leg swelling, presyncope, palpitations, heartburn, abdominal pain, anorexia, nausea, vomiting, diarrhea  or change in bowel or urinary habits, change in stools or urine, dysuria,hematuria,  rash, arthralgias, visual complaints, headache, numbness weakness or ataxia or problems with walking or coordination,  change in mood/affect or memory.       Review of Systems  Constitutional: Negative for fever and unexpected weight change.  HENT: Positive for ear pain. Negative for nosebleeds, congestion, sore throat, rhinorrhea, sneezing, trouble swallowing, dental problem, postnasal drip and sinus pressure.   Eyes: Negative for  redness and itching.  Respiratory: Positive for shortness of breath. Negative for cough, chest tightness and wheezing.   Cardiovascular: Positive for chest pain and palpitations. Negative for leg swelling.  Gastrointestinal: Negative for nausea and vomiting.  Genitourinary: Negative for dysuria.  Musculoskeletal: Negative for joint swelling.  Skin: Negative for rash.  Neurological: Positive for headaches.  Hematological: Bruises/bleeds easily.  Psychiatric/Behavioral: Positive for dysphoric mood. The patient is nervous/anxious.        Objective:   Physical Exam  Anxious chronically ill appearing thin bf nad Wt Readings from Last 3 Encounters:  05/16/12 136 lb (61.689 kg)  05/12/12 132 lb (59.875 kg)  04/30/12 130 lb (58.968 kg)     HEENT: nl dentition, turbinates, and orophanx. Nl external ear canals without cough reflex   NECK :  without JVD/Nodes/TM/ nl carotid upstrokes bilaterally   LUNGS: no acc muscle use, clear to A and P bilaterally without cough on insp or exp maneuvers   CV:  RRR  no s3 or murmur or increase in P2, no edema   ABD:  soft and nontender with nl excursion in the supine position. No bruits or organomegaly, bowel sounds nl  MS:  warm without deformities, calf tenderness, cyanosis or clubbing  SKIN: warm and dry without lesions    NEURO:  alert, approp, no deficits    cxr 03/06/12 No pneumonia or acute cardiopulmonary disease. Subsegmental  atelectasis in the lingula       Assessment & Plan:

## 2012-05-16 NOTE — Patient Instructions (Addendum)
The key is to stop smoking completely - it's the most important aspect of your care  symbicort 160 Take 2 puffs first thing in am and then another 2 puffs about 12 hours later.    Work on inhaler technique:  relax and gently blow all the way out then take a nice smooth deep breath back in, triggering the inhaler at same time you start breathing in.  Hold for up to 5 seconds if you can.  Rinse and gargle with water when done   If your mouth or throat starts to bother you,   I suggest you time the inhaler to your dental care and after using the inhaler(s) brush teeth and tongue with a baking soda containing toothpaste and when you rinse this out, gargle with it first to see if this helps your mouth and throat.     Please schedule a follow up office visit in 2 weeks, sooner if needed

## 2012-05-16 NOTE — Assessment & Plan Note (Signed)
>   3 min  Discussed risk to her asthma and to her baby, agreed to try hard to stop completely at this point

## 2012-05-16 NOTE — Assessment & Plan Note (Signed)
Reviewed last cxr and do not see enough changes to warrant additonal w/u at this stage of pregnancy

## 2012-05-16 NOTE — Assessment & Plan Note (Signed)
All goals of chronic asthma control met including optimal function and elimination of symptoms with minimal need for rescue therapy.  Contingencies discussed in full including contacting this office immediately if not controlling the symptoms using the rule of two's.     She is way overusing her saba now  Discussed in detail all the  indications, usual  risks and alternatives  relative to the benefits with patient who agrees to proceed with trial of symbicort 160 2bid   The proper method of use, as well as anticipated side effects, of a metered-dose inhaler are discussed and demonstrated to the patient. Improved effectiveness after extensive coaching during this visit to a level of approximately  75%

## 2012-05-19 ENCOUNTER — Telehealth: Payer: Self-pay | Admitting: Obstetrics and Gynecology

## 2012-05-19 ENCOUNTER — Other Ambulatory Visit: Payer: Self-pay | Admitting: Obstetrics and Gynecology

## 2012-05-19 DIAGNOSIS — M7989 Other specified soft tissue disorders: Secondary | ICD-10-CM

## 2012-05-19 NOTE — Telephone Encounter (Signed)
Pt called, is 25w 5d, states has been having swelling in her legs and feet, especially on the left side.  Pt says she is on her feet a lot, but she has been elevating legs and drinking a lot of "liquids" but swelling is not going away.  Pt also states has been having calf pain and it hurts to walk on her left leg, no issues with pain in the right leg.  C/w VL, pt scheduled for dopplers of the left lower extremity tomorrow am @ [facility] @ 0900, pt to follow up w/ VL afterward.  Pt voices agreement, all instructions given to patient.

## 2012-05-20 ENCOUNTER — Encounter: Payer: Self-pay | Admitting: Obstetrics and Gynecology

## 2012-05-20 ENCOUNTER — Other Ambulatory Visit: Payer: Self-pay | Admitting: Obstetrics and Gynecology

## 2012-05-20 ENCOUNTER — Ambulatory Visit (INDEPENDENT_AMBULATORY_CARE_PROVIDER_SITE_OTHER): Payer: Medicaid Other | Admitting: Obstetrics and Gynecology

## 2012-05-20 ENCOUNTER — Ambulatory Visit (HOSPITAL_COMMUNITY)
Admission: RE | Admit: 2012-05-20 | Discharge: 2012-05-20 | Disposition: A | Discharge status: Home / Self Care | Payer: 59 | Source: Ambulatory Visit | Attending: Obstetrics and Gynecology | Admitting: Obstetrics and Gynecology

## 2012-05-20 ENCOUNTER — Ambulatory Visit: Payer: 59 | Admitting: Obstetrics and Gynecology

## 2012-05-20 VITALS — BP 100/60 | Wt 137.0 lb

## 2012-05-20 DIAGNOSIS — R600 Localized edema: Secondary | ICD-10-CM

## 2012-05-20 DIAGNOSIS — M7989 Other specified soft tissue disorders: Secondary | ICD-10-CM | POA: Insufficient documentation

## 2012-05-20 DIAGNOSIS — B009 Herpesviral infection, unspecified: Secondary | ICD-10-CM | POA: Insufficient documentation

## 2012-05-20 DIAGNOSIS — R609 Edema, unspecified: Secondary | ICD-10-CM

## 2012-05-20 DIAGNOSIS — Z331 Pregnant state, incidental: Secondary | ICD-10-CM

## 2012-05-20 LAB — CBC
MCHC: 33.8 g/dL (ref 30.0–36.0)
Platelets: 328 10*3/uL (ref 150–400)
RDW: 14 % (ref 11.5–15.5)
WBC: 13.8 10*3/uL — ABNORMAL HIGH (ref 4.0–10.5)

## 2012-05-20 NOTE — Progress Notes (Signed)
[redacted]w[redacted]d No void water given  Doppler assessment at [facility] prior to visit today Pt states feels like "pins and needles" sticking her L chest and both sides of abdominal area x's couple of months

## 2012-05-20 NOTE — Progress Notes (Signed)
Severe pedal edema noted, but not as significant in calves.   Patient with sensitivity and pain in right leg/foot when swelling is worse. Negative Homan's, trace edema in shins, but 1-2+ in feet.  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 No other edema noted.  No HA, visual sx, etc. Asthma stable.   Will be OOW x 1 week initially, then will re-evaluate at [facility].  Hx pre-eclampsia last pregnancy (19 years ago....). 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 Hx of both female and female partners--unsure of transmission, but has had previous consistent partner x 5 years (not currently with that partner). Due to + titer results, will start Valtrex suppression at 34 weeks--patient agreeable with plan.  Will report any lesions durin pregnancy. Glucola NV. PIH labs today for baseline.

## 2012-05-20 NOTE — Progress Notes (Unsigned)
Had called yesterday with swelling. Had doppler for DVT assessment before visit--negative.

## 2012-05-20 NOTE — Progress Notes (Signed)
*  Preliminary Results* Left lower extremity venous duplex completed. Left lower extremity is negative for deep vein thrombosis. No evidence of left Baker's cyst. Preliminary results discussed with [facility].  05/20/2012 10:10 AM Gertie Fey, RDMS, RDCS

## 2012-05-21 ENCOUNTER — Encounter: Payer: 59 | Admitting: Obstetrics and Gynecology

## 2012-05-21 ENCOUNTER — Institutional Professional Consult (permissible substitution): Payer: 59 | Admitting: Internal Medicine

## 2012-05-21 LAB — COMPREHENSIVE METABOLIC PANEL
ALT: 9 U/L (ref 0–53)
AST: 13 U/L (ref 0–37)
Albumin: 3.1 g/dL — ABNORMAL LOW (ref 3.5–5.2)
Alkaline Phosphatase: 64 U/L (ref 39–117)
BUN: 4 mg/dL — ABNORMAL LOW (ref 6–23)
Calcium: 8.2 mg/dL — ABNORMAL LOW (ref 8.4–10.5)
Chloride: 110 mEq/L (ref 96–112)
Potassium: 3.6 mEq/L (ref 3.5–5.3)
Sodium: 140 mEq/L (ref 135–145)

## 2012-05-21 NOTE — Progress Notes (Signed)
This patient was late for her appt, but came and was seen. Please remove the indication that she DNKA. Thanks. VL

## 2012-05-29 ENCOUNTER — Encounter: Payer: Self-pay | Admitting: Obstetrics and Gynecology

## 2012-05-29 ENCOUNTER — Ambulatory Visit (INDEPENDENT_AMBULATORY_CARE_PROVIDER_SITE_OTHER): Payer: Medicaid Other | Admitting: Obstetrics and Gynecology

## 2012-05-29 VITALS — BP 100/56 | Wt 135.0 lb

## 2012-05-29 DIAGNOSIS — F329 Major depressive disorder, single episode, unspecified: Secondary | ICD-10-CM

## 2012-05-29 DIAGNOSIS — O288 Other abnormal findings on antenatal screening of mother: Secondary | ICD-10-CM

## 2012-05-29 DIAGNOSIS — Z331 Pregnant state, incidental: Secondary | ICD-10-CM

## 2012-05-29 DIAGNOSIS — O09899 Supervision of other high risk pregnancies, unspecified trimester: Secondary | ICD-10-CM | POA: Insufficient documentation

## 2012-05-29 LAB — CBC
MCH: 30.5 pg (ref 26.0–34.0)
MCHC: 33.8 g/dL (ref 30.0–36.0)
MCV: 90.3 fL (ref 78.0–100.0)
Platelets: 314 10*3/uL (ref 150–400)
RBC: 3.41 MIL/uL — ABNORMAL LOW (ref 3.87–5.11)

## 2012-05-29 MED ORDER — PRENATAL VITAMINS (DIS) PO TABS: 1.0000 | ORAL_TABLET | Freq: Every day | ORAL | Status: DC

## 2012-05-29 NOTE — Progress Notes (Signed)
[redacted]w[redacted]d Glucola today GFM Pt c/o increased sadness Depression symptoms: anxiety, crying spells and irritability Homicidal thoughts: no Suicidal thoughts: no   Suggest starting antidepressants. Reviewed mechanism of action, expected benefits, time to benefits, possible side effects and possible need for trial and error. Also reviewed treatment duration of 6-12 months before considering weaning off medication. Referral to psychiatrist offered but declined at this time. Patient instructed to call office if symptoms worsen or suicidal / homicidal thoughts occur.Patient declines for now.  Will draw TSH as well today

## 2012-05-29 NOTE — Addendum Note (Signed)
Addended by: Silverio Lay on: 05/29/2012 12:27 PM   Modules accepted: Orders

## 2012-05-29 NOTE — Progress Notes (Signed)
[redacted]w[redacted]d  Pt has feet swelling  GTT today

## 2012-05-30 ENCOUNTER — Encounter: Payer: Self-pay | Admitting: Internal Medicine

## 2012-05-30 ENCOUNTER — Telehealth: Payer: Self-pay

## 2012-05-30 ENCOUNTER — Ambulatory Visit (INDEPENDENT_AMBULATORY_CARE_PROVIDER_SITE_OTHER): Payer: 59 | Admitting: Internal Medicine

## 2012-05-30 VITALS — BP 122/80 | HR 103 | Temp 97.3°F | Ht 68.0 in | Wt 137.2 lb

## 2012-05-30 DIAGNOSIS — J984 Other disorders of lung: Secondary | ICD-10-CM

## 2012-05-30 DIAGNOSIS — J45909 Unspecified asthma, uncomplicated: Secondary | ICD-10-CM

## 2012-05-30 DIAGNOSIS — F17200 Nicotine dependence, unspecified, uncomplicated: Secondary | ICD-10-CM

## 2012-05-30 DIAGNOSIS — O0990 Supervision of high risk pregnancy, unspecified, unspecified trimester: Secondary | ICD-10-CM

## 2012-05-30 LAB — GLUCOSE TOLERANCE, 1 HOUR (50G) W/O FASTING: Glucose, 1 Hour GTT: 85 mg/dL (ref 70–140)

## 2012-05-30 MED ORDER — BUDESONIDE-FORMOTEROL FUMARATE 160-4.5 MCG/ACT IN AERO: INHALATION_SPRAY | RESPIRATORY_TRACT | Status: DC

## 2012-05-30 NOTE — Progress Notes (Signed)
Subjective:    Patient ID: Anne Olson, female    DOB: 02/25/1976  MRN: [id number]   HPI 40 yobf smoker dx of asthma as teenager before started smoking, then 1994 pregancy / asthma then admitted to [facility] (only time admitted) w/in one year post partum x 2 weeks with severe asthma, never since, referred by Nigel Bridgeman for evaluation of sob during pregnancy.   05/16/2012 1st pulmonary eval still  smoking @ [redacted] weeks gestation cc sob x across the room onset was w/in first 10 weeks of learning she was pregnant, pattern different as  no better p albuterol,  Her asthma symptoms are typically  Some better with advair, lots better p nebulizer but doesn't have one - last ER 6 months.  Typical triggers for asthma spells are strong fumes > tightness, better with proventil, needs to use daily when goes work. rec The key is to stop smoking completely - it's the most important aspect of your care symbicort 160 Take 2 puffs first thing in am and then another 2 puffs about 12 hours later.  Work on inhaler technique:  relax and gently Please schedule a follow up office visit in 2 weeks, sooner if needed    05/30/2012 f/u Shyler Holzman ov  Still smoking cc no change doe or need for albuterol, once gets comfortable at night does ok propped up   No obvious daytime variabilty or assoc chronic cough or cp or chest tightness, subjective wheeze overt sinus or hb symptoms. No unusual exp hx or h/o childhood pna/ asthma or premature birth to her knowledge.   Sleeping ok without nocturnal  or early am exacerbation  of respiratory  c/o's or need for noct saba. Also denies any obvious fluctuation of symptoms with weather or environmental changes or other aggravating or alleviating factors except as outlined above   ROS  The following are not active complaints unless bolded sore throat, dysphagia, dental problems, itching, sneezing,  nasal congestion or excess/ purulent secretions, ear ache,   fever, chills, sweats, unintended wt  loss, pleuritic or exertional cp, hemoptysis,  orthopnea pnd or leg swelling, presyncope, palpitations, heartburn, abdominal pain, anorexia, nausea, vomiting, diarrhea  or change in bowel or urinary habits, change in stools or urine, dysuria,hematuria,  rash, arthralgias, visual complaints, headache, numbness weakness or ataxia or problems with walking or coordination,  change in mood/affect or memory.            Objective:   Physical Exam  Anxious chronically ill appearing thin bf nad ? Angry affect 05/30/2012  137  Wt Readings from Last 3 Encounters:  05/16/12 136 lb (61.689 kg)  05/12/12 132 lb (59.875 kg)  04/30/12 130 lb (58.968 kg)     HEENT: nl dentition, turbinates, and orophanx. Nl external ear canals without cough reflex   NECK :  without JVD/Nodes/TM/ nl carotid upstrokes bilaterally   LUNGS: no acc muscle use, clear to A and P bilaterally without cough on insp or exp maneuvers   CV:  RRR  no s3 or murmur or increase in P2, no edema   ABD:  soft and nontender with nl excursion in the supine position. No bruits or organomegaly, bowel sounds nl. C/w [redacted] weeks gestation  MS:  warm without deformities, calf tenderness, cyanosis or clubbing  SKIN: warm and dry without lesions         cxr 03/06/12 No pneumonia or acute cardiopulmonary disease. Subsegmental  atelectasis in the lingula       Assessment & Plan:

## 2012-05-30 NOTE — Patient Instructions (Addendum)
No change in medications but bring your respiratory medications with you on return  Only use your albuterol (blue = ventolin)  as a rescue medication to be used if you can't catch your breath by resting or doing a relaxed purse lip breathing pattern. The less you use it, the better it will work when you need it.   The key is to stop smoking completely before smoking completely stops you - it's the most important aspect of your care   Please schedule a follow up office visit in 4 weeks, sooner if needed

## 2012-05-30 NOTE — Assessment & Plan Note (Signed)
The proper method of use, as well as anticipated side effects, of a metered-dose inhaler are discussed and demonstrated to the patient. Improved effectiveness after extensive coaching during this visit to a level of approximately  90%   she is still overusing her albuterol for reasons not clear as the dose she's using of symbicort  correctly and effectively should improve symptoms and eliminate completely the albuterol.  Nothing else to offer at this point other than smoking cessation, discussed separately

## 2012-05-30 NOTE — Telephone Encounter (Signed)
Spoke with pt informing her she has been referral to high risk pregnancy clinic due to multi risk fx per VL. Referral sent today and they will contact her regarding appts. Pt agrees and voices understanding.

## 2012-05-30 NOTE — Assessment & Plan Note (Signed)

## 2012-06-09 ENCOUNTER — Ambulatory Visit (INDEPENDENT_AMBULATORY_CARE_PROVIDER_SITE_OTHER): Payer: Medicaid Other | Admitting: Obstetrics and Gynecology

## 2012-06-09 ENCOUNTER — Encounter: Payer: Self-pay | Admitting: Obstetrics and Gynecology

## 2012-06-09 ENCOUNTER — Other Ambulatory Visit: Payer: 59

## 2012-06-09 VITALS — BP 110/64 | Wt 138.0 lb

## 2012-06-09 DIAGNOSIS — F17200 Nicotine dependence, unspecified, uncomplicated: Secondary | ICD-10-CM

## 2012-06-09 DIAGNOSIS — O09899 Supervision of other high risk pregnancies, unspecified trimester: Secondary | ICD-10-CM

## 2012-06-09 DIAGNOSIS — R0989 Other specified symptoms and signs involving the circulatory and respiratory systems: Secondary | ICD-10-CM

## 2012-06-09 DIAGNOSIS — O289 Unspecified abnormal findings on antenatal screening of mother: Secondary | ICD-10-CM

## 2012-06-09 DIAGNOSIS — O0930 Supervision of pregnancy with insufficient antenatal care, unspecified trimester: Secondary | ICD-10-CM

## 2012-06-09 DIAGNOSIS — Z331 Pregnant state, incidental: Secondary | ICD-10-CM

## 2012-06-09 DIAGNOSIS — D649 Anemia, unspecified: Secondary | ICD-10-CM

## 2012-06-09 DIAGNOSIS — R0600 Dyspnea, unspecified: Secondary | ICD-10-CM

## 2012-06-09 DIAGNOSIS — Z3490 Encounter for supervision of normal pregnancy, unspecified, unspecified trimester: Secondary | ICD-10-CM

## 2012-06-09 MED ORDER — PRENATAL VITAMINS (DIS) PO TABS: 1.0000 | ORAL_TABLET | Freq: Every day | ORAL | Status: DC

## 2012-06-09 MED ORDER — FUSION PLUS PO CAPS: 1.0000 | ORAL_CAPSULE | Freq: Two times a day (BID) | ORAL | Status: DC

## 2012-06-09 NOTE — Progress Notes (Signed)
[redacted]w[redacted]d Seeing Dr. Sherene Sires for asthma.  Still smoking 5-6 cigs daily Has been transferred to [facility].  First appt Jun 24, 2011 Reviewed labs from last visit.  1hr glu nl, RPR neg. Hgb 10.4.  Fusion Plus prescribed and sampled

## 2012-06-09 NOTE — Addendum Note (Signed)
Addended by: Lerry Liner D on: 06/09/2012 03:18 PM   Modules accepted: Orders

## 2012-06-09 NOTE — Patient Instructions (Signed)
OTC Iron sulfate twice daily.

## 2012-06-10 MED ORDER — PRENATAL VITAMINS (DIS) PO TABS: 1.0000 | ORAL_TABLET | Freq: Every day | ORAL | Status: DC

## 2012-06-10 NOTE — Addendum Note (Signed)
Addended by: Darien Ramus on: 06/10/2012 08:08 AM   Modules accepted: Orders

## 2012-06-11 NOTE — L&D Delivery Note (Signed)
Delivery Note At 8:06 PM a viable and healthy female was delivered via Vaginal, Spontaneous Delivery (Presentation: ; Occiput Anterior).  APGAR: 8, 9; weight pending .   Placenta status: Intact, Spontaneous.  Cord: 3 vessels.   Anesthesia: None  Episiotomy: None Lacerations: 1st degree  Suture Repair: pt declined repair Est. Blood Loss (mL): 200 mL   Mom to postpartum.  Baby to nursery-stable.  Gregor Hams 08/12/2012, 8:51 PM   At 2006 this 37 y/o G3 now P2012 delivered a viable female infant over an intact perineum by NSVD with APGARs at 8 and 9.  Pt requested epidural but we were unable to place due to precipitous delivery.  Baby was placed on mom chest at time of delivery and cord clamp delayed for approximately 1 minute post delivery.  Cord was cut by pt's daughter.  Cord blood collected.  Intact 3 vessel cord placenta delivered spontaneously.  Vagina inspected with 1st degree laceration pt declined to have repaired.  EBL 200 mL.  Pt stable to mother baby and baby to [facility].  Wynelle Bourgeois supervised the delivery.    I attended delivery. No difficulty with shoulders, though patient was moving a lot with delivery.  Agree with note. Wynelle Bourgeois CNM

## 2012-06-15 ENCOUNTER — Inpatient Hospital Stay (HOSPITAL_COMMUNITY)
Admission: AD | Admit: 2012-06-15 | Discharge: 2012-06-16 | Disposition: A | Discharge status: Home / Self Care | Payer: 59 | Source: Ambulatory Visit | Attending: Obstetrics and Gynecology | Admitting: Obstetrics and Gynecology

## 2012-06-15 DIAGNOSIS — R51 Headache: Secondary | ICD-10-CM | POA: Insufficient documentation

## 2012-06-15 DIAGNOSIS — IMO0002 Reserved for concepts with insufficient information to code with codable children: Secondary | ICD-10-CM | POA: Insufficient documentation

## 2012-06-15 DIAGNOSIS — H538 Other visual disturbances: Secondary | ICD-10-CM | POA: Insufficient documentation

## 2012-06-15 HISTORY — DX: Scoliosis, unspecified: M41.9

## 2012-06-15 NOTE — MAU Note (Signed)
Pt G3 P1 at 29.4wks with increased swelling in feet and legs, frequent headaches, blurred vision and increased weight gain.

## 2012-06-16 ENCOUNTER — Encounter: Payer: 59 | Admitting: Obstetrics and Gynecology

## 2012-06-16 ENCOUNTER — Encounter (HOSPITAL_COMMUNITY): Payer: Self-pay

## 2012-06-16 DIAGNOSIS — R609 Edema, unspecified: Secondary | ICD-10-CM

## 2012-06-16 DIAGNOSIS — E876 Hypokalemia: Secondary | ICD-10-CM

## 2012-06-16 DIAGNOSIS — R945 Abnormal results of liver function studies: Secondary | ICD-10-CM

## 2012-06-16 DIAGNOSIS — Z331 Pregnant state, incidental: Secondary | ICD-10-CM

## 2012-06-16 LAB — URIC ACID: Uric Acid, Serum: 2.7 mg/dL (ref 2.4–7.0)

## 2012-06-16 LAB — COMPREHENSIVE METABOLIC PANEL
BUN: 4 mg/dL — ABNORMAL LOW (ref 6–23)
CO2: 24 mEq/L (ref 19–32)
Calcium: 8.8 mg/dL (ref 8.4–10.5)
Chloride: 103 mEq/L (ref 96–112)
Creatinine, Ser: 0.53 mg/dL (ref 0.50–1.10)
GFR calc Af Amer: >90 mL/min (ref >90)
GFR calc non Af Amer: >90 mL/min (ref >90)
Glucose, Bld: 73 mg/dL (ref 70–99)
Total Bilirubin: 0.1 mg/dL — ABNORMAL LOW (ref 0.3–1.2)

## 2012-06-16 LAB — LACTATE DEHYDROGENASE: LDH: 187 U/L (ref 94–250)

## 2012-06-16 LAB — URINALYSIS, ROUTINE W REFLEX MICROSCOPIC
Bilirubin Urine: NEGATIVE
Glucose, UA: NEGATIVE mg/dL
Hgb urine dipstick: NEGATIVE
Specific Gravity, Urine: <1.005 — ABNORMAL LOW (ref 1.005–1.030)

## 2012-06-16 LAB — CBC
HCT: 29.6 % — ABNORMAL LOW (ref 36.0–46.0)
MCH: 30.7 pg (ref 26.0–34.0)
MCV: 90 fL (ref 78.0–100.0)
RBC: 3.29 MIL/uL — ABNORMAL LOW (ref 3.87–5.11)
WBC: 14.3 10*3/uL — ABNORMAL HIGH (ref 4.0–10.5)

## 2012-06-16 LAB — HEPATITIS B SURFACE ANTIGEN: Hepatitis B Surface Ag: NEGATIVE

## 2012-06-16 MED ORDER — POTASSIUM CHLORIDE ER 10 MEQ PO TBCR: 20.0000 meq | EXTENDED_RELEASE_TABLET | Freq: Two times a day (BID) | ORAL | Status: DC

## 2012-06-16 NOTE — MAU Provider Note (Signed)
History     CSN: [id number]  Arrival date and time: 06/15/12 2330   First Provider Initiated Contact with Patient 06/16/12 0045      Chief Complaint  Patient presents with  . Leg Swelling  . Headache  . Blurred Vision   HPI Comments: Pt is a 36yo G3P1 at [redacted]w[redacted]d arrived unannounced w c/o increased swelling in feet and legs. States she's had the swelling in her feet for "months" last few days it's now in lower legs. She denies any HA now, tho reports she has HA's all the time. C/o feeling fatigued. Denies any blurry vision, N/V or RUQ pain. Denies any VB, LOF, +FM.   Headache  Pertinent negatives include no abdominal pain, blurred vision, nausea or vomiting.      Past Medical History  Diagnosis Date  . Heart murmur   . Infection     Yeast inf;gets freq w/ antibxs or scented soaps  . Infection     BV;recently treated for BV completed Flagyl  . Anemia     iron supplements in the past  . Ovarian cyst in pregnancy 2013    seen on recent US  . Benign breast cyst in female 1995    had bx done was normal  . DUB (dysfunctional uterine bleeding) 1995  . Asthma     has albuterol, advair prn, no attack x 2 years;triggered by strong smells (bleach and smoke)  . Anxiety     has been prescribed Xanax  . Preeclampsia 1994    induced @ 38 weeks  . Headache     during and prior to pregnancy;usually Rt side of head;can effect vision  . 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 Memory loss 2013    Currently having difficult time remembering things  . Scoliosis     Past Surgical History  Procedure Date  . No past surgeries   . Wisdom tooth extraction 05/2011    all 4 removed    Family History  Problem Relation Age of Onset  . Cancer Mother     Throat  . Cancer Father     Throat  . Hypertension Father   . Hypertension Paternal Grandmother   . Hypertension Paternal Aunt   . Diabetes Maternal Aunt   . Asthma Sister   . Asthma Brother   . Asthma Cousin     . Asthma Paternal Aunt   . Heart failure Paternal Grandmother   . Thyroid disease Father     Hyperthyoid  . Seizures Mother   . Stroke Maternal Uncle   . Stroke Paternal Uncle     x 2  . Ulcers Mother   . Pancreatitis Mother   . Pancreatitis Father   . COPD Father   . Cirrhosis Father     Liver  . Sickle cell trait Other     Nephew    History  Substance Use Topics  . Smoking status: Current Every Day Smoker -- 0.2 packs/day    Types: Cigarettes  . Smokeless tobacco: Never Used  . Alcohol Use: No     Comment: Occasionally    Allergies:  Allergies  Allergen Reactions  . Tomato Hives    & throat swelling    Prescriptions prior to admission  Medication Sig Dispense Refill  . albuterol (PROVENTIL HFA;VENTOLIN HFA) 108 (90 BASE) MCG/ACT inhaler Inhale 2 puffs into the lungs every 4 (four) hours as needed for wheezing.  1 Inhaler  2  .  budesonide-formoterol (SYMBICORT) 160-4.5 MCG/ACT inhaler Take 2 puffs first thing in am and then another 2 puffs about 12 hours later.  1 Inhaler  12  . cyclobenzaprine (FLEXERIL) 10 MG tablet Take 1 tablet (10 mg total) by mouth every 8 (eight) hours as needed for muscle spasms.  30 tablet  0  . Iron-FA-B Cmp-C-Biot-Probiotic (FUSION PLUS) CAPS Take 1 capsule by mouth 2 (two) times daily.  60 capsule  3  . Prenat-FeFum-FePo-FA-Omega 3 (CONCEPT DHA) 53.5-38-1 MG CAPS Take 1 capsule by mouth daily.  30 capsule  11  . triamcinolone (KENALOG) 0.025 % ointment Apply topically 2 (two) times daily.  30 g  1  . hydrOXYzine (VISTARIL) 50 MG capsule Take 1 capsule (50 mg total) by mouth every 6 (six) hours as needed for itching.  30 capsule  0  . ibuprofen (ADVIL) 200 MG tablet Take 3 tablets (600 mg total) by mouth every 6 (six) hours as needed for pain.  30 tablet  0  . ondansetron (ZOFRAN-ODT) 4 MG disintegrating tablet Take 1 tablet (4 mg total) by mouth every 8 (eight) hours as needed for nausea.  24 tablet  1  . Prenatal Vitamins (DIS) TABS Take 1  tablet by mouth daily.  90 tablet  4    Review of Systems  Constitutional: Positive for malaise/fatigue.  Eyes: Negative for blurred vision and double vision.  Cardiovascular: Positive for leg swelling. Negative for chest pain.  Gastrointestinal: Negative for heartburn, nausea, vomiting and abdominal pain.  Neurological: Negative for headaches.  All other systems reviewed and are negative.   Physical Exam   Blood pressure 121/62, pulse 101, temperature 97.8 F (36.6 C), temperature source Oral, resp. rate 18, height 5\' 8"  (1.727 m), weight 143 lb 9.6 oz (65.137 kg), last menstrual period 11/21/2011.  Physical Exam  Nursing note and vitals reviewed. Constitutional: She is oriented to person, place, and time. She appears well-developed and well-nourished.  HENT:  Head: Normocephalic.  Eyes: Pupils are equal, round, and reactive to light.  Neck: Normal range of motion.  Cardiovascular: Normal rate, regular rhythm, normal heart sounds and intact distal pulses.   Respiratory: Effort normal and breath sounds normal.  GI: Soft. Bowel sounds are normal.  Genitourinary:       Deferred   Musculoskeletal: Normal range of motion. She exhibits edema and tenderness.       Bilateral 2+ pedal edema, non-pitting Lower extremities skin appears tight, but no evidence of edema  Tenderness w flexion of ankles   Neurological: She is alert and oriented to person, place, and time. She has normal reflexes.       Neg clonus   Skin: Skin is warm and dry.  Psychiatric: She has a normal mood and affect. Her behavior is normal.   FHR cat 1 toco quiet   MAU Course  Procedures    Assessment and Plan  IUP at [redacted]w[redacted]d R/o pre-eclampsia UA - neg Labs - pending    Elizabethanne Lusher M 06/16/2012, 12:53 AM   Addendum: at 1610  Hypokalemia  Elevated LFT's  rv'd w Dr Stefano Gaul  Will order hepatitis panel  Rx for K-Dur BID  F/U [facility] as scheduled   S.Lyssa Hackley, CNM

## 2012-06-16 NOTE — MAU Note (Signed)
CRITICAL VALUE ALERT  Critical value received:  Potassium 2.6  Date of notification:  06/16/12  Time of notification:  0122  Critical value read back:yes  Nurse who received alert:  [facility]   MD notified (1st page):  Almond Lint CNM  Time of first page:  0123  MD notified (2nd page):  Time of second page:  Responding MD:  Almond Lint CNM  Time MD responded:  [phone]

## 2012-06-17 ENCOUNTER — Other Ambulatory Visit: Payer: Self-pay | Admitting: Obstetrics and Gynecology

## 2012-06-17 MED ORDER — CONCEPT DHA 53.5-38-1 MG PO CAPS: 1.0000 | ORAL_CAPSULE | Freq: Every day | ORAL | Status: DC

## 2012-06-23 ENCOUNTER — Ambulatory Visit (INDEPENDENT_AMBULATORY_CARE_PROVIDER_SITE_OTHER): Payer: 59 | Admitting: Family Medicine

## 2012-06-23 ENCOUNTER — Encounter: Payer: 59 | Admitting: Obstetrics and Gynecology

## 2012-06-23 VITALS — BP 138/79 | Temp 98.4°F | Wt 144.7 lb

## 2012-06-23 DIAGNOSIS — F411 Generalized anxiety disorder: Secondary | ICD-10-CM

## 2012-06-23 DIAGNOSIS — R600 Localized edema: Secondary | ICD-10-CM

## 2012-06-23 DIAGNOSIS — E876 Hypokalemia: Secondary | ICD-10-CM

## 2012-06-23 DIAGNOSIS — R7401 Elevation of levels of liver transaminase levels: Secondary | ICD-10-CM

## 2012-06-23 DIAGNOSIS — O0993 Supervision of high risk pregnancy, unspecified, third trimester: Secondary | ICD-10-CM

## 2012-06-23 DIAGNOSIS — R609 Edema, unspecified: Secondary | ICD-10-CM

## 2012-06-23 DIAGNOSIS — F419 Anxiety disorder, unspecified: Secondary | ICD-10-CM

## 2012-06-23 DIAGNOSIS — O26849 Uterine size-date discrepancy, unspecified trimester: Secondary | ICD-10-CM

## 2012-06-23 LAB — COMPREHENSIVE METABOLIC PANEL
Albumin: 3 g/dL — ABNORMAL LOW (ref 3.5–5.2)
Alkaline Phosphatase: 73 U/L (ref 39–117)
BUN: 4 mg/dL — ABNORMAL LOW (ref 6–23)
CO2: 19 mEq/L (ref 19–32)
Calcium: 8.6 mg/dL (ref 8.4–10.5)
Chloride: 109 mEq/L (ref 96–112)
Glucose, Bld: 92 mg/dL (ref 70–99)
Potassium: 3.6 mEq/L (ref 3.5–5.3)
Sodium: 139 mEq/L (ref 135–145)
Total Protein: 5.8 g/dL — ABNORMAL LOW (ref 6.0–8.3)

## 2012-06-23 LAB — POCT URINALYSIS DIP (DEVICE)
Glucose, UA: NEGATIVE mg/dL
Hgb urine dipstick: NEGATIVE
Ketones, ur: NEGATIVE mg/dL
Specific Gravity, Urine: 1.015 (ref 1.005–1.030)
Urobilinogen, UA: 0.2 mg/dL (ref 0.0–1.0)

## 2012-06-23 NOTE — Progress Notes (Signed)
Nutrition note: 1st visit consult Pt has gained 19.7# @ [redacted]w[redacted]d, which is wnl.  Pt reports eating 1 big meal & 3-4 snacks/d. Pt is taking PNV, K, & iron. Pt reports no N&V or heartburn. Pt reports she is lactose intolerant but still drinks regular milk. Pt stated she is not doing any physical activity due to swelling in legs & shortness of breath. Pt received verbal & written education on general nutrition during pregnancy.  Disc wt gain goals of 25-35# or 1#/wk. Pt agrees to continue taking PNV & consume protein sources with all meals & snacks. Pt does not receive WIC but plans to apply. Pt plans to BF. F/u if referred Blondell Reveal, MS, RD, LDN

## 2012-06-23 NOTE — Progress Notes (Signed)
Pulse- 105 Patient reports pain and a lot of swelling in her legs & feet, makes it difficult to walk. Also reports increased wheezing at night time

## 2012-06-23 NOTE — Progress Notes (Signed)
Subjective:    Anne Olson is being seen today for her first obstetrical visit at [facility]. She is referred from [facility] for high risk pregnancy.  She is at [redacted]w[redacted]d gestation. Dating is by LMP c/w 6 week ultrasound. Her obstetrical history is significant for advanced maternal age, history of hypertension/preeclampsia with first pregnancy, smoker, severe asthma, anxiety/depression, positive HSV titer, headaches, insufficient prenatal care, hypokalemia, and elevated transaminases. Pregnancy history fully reviewed. First pregnancy resulted in term vaginal delivery in 1994, induced for high blood pressure and protein in her urine (per patient). Second pregnancy TAB.   Today she reports fetal movement; no bleeding, contractions or loss of fluid. The patient states she has had poor weight gain this pregnancy. She complains of migraines, painful swelling in her ankles and calves (L > R), leg cramping/aching (back of calves). Her asthma is controlled with Symbicort and albuterol. She sees Dr. Sherene Sires with [facility] pulmonology (next appt 1/17). She still smokes 4-5 cigarettes a day. Her leg swelling is better when she keeps her feet up. She has tried knee-high TED hose but states that her legs swell around the top of the stocking. She had a L lower extremity doppler in December showing no DVT. She was seen in MAU on 06/16/12 with headache and leg swelling. Her potassium was 2.6, with normal creatinine and elevated AST and ALT. She was started on KCl 20 meQ BID. A hepatitis panel was drawn and was negative for Hep B and Hep C.  Blood pressure was normal. She also has anxiety and depressed mood. She has taken Xanax in the past, which has helped.  OB History    Grav Para Term Preterm Abortions TAB SAB Ect Mult Living   3 1 1  0 1 1 0 0 0 1      Patient's last menstrual period was 11/21/2011.    The following portions of the patient's history were reviewed and updated as appropriate: allergies, current  medications, past family history, past medical history, past social history, past surgical history and problem list.  Review of Systems Constitutional: positive for fatigue and poor weight gain; negative for fever/chills Eyes: positive for occasional blurry vision Respiratory: positive for asthma, dyspnea on exertion and wheezing Cardiovascular: Positive for occasional chest pain with SOB. Genitourinary: Denies dysuria Musculoskeletal:Leg pain/cramps/swelling Neurological: Headaches - migraines    Objective:   Vitals reviewed.  Filed Vitals:   06/23/12 0848  BP: 138/79  Temp: 98.4 F (36.9 C)     General appearance: alert, cooperative, appears older than stated age and no distress Head: Normocephalic, without obvious abnormality, atraumatic Eyes: conjunctivae/corneas clear.  EOM's intact.  Neck: supple, symmetrical, trachea midline  Lungs: clear to auscultation bilaterally Heart: regular rate and rhythm, no murmur Abdomen: normal bowel sounds, non-tender Extremities: 1+ edema of feet and ankles, tender, no calf tenderness. Neurologic: Grossly normal  Psych: constricted affect   Assessment/Plan:    Pregnancy at 30 and 5/7 weeks  - reviewed labs and sonos AMA:  Harmony screen negative. Asthma - f/u with Dr. Sherene Sires as scheduled.  Smoking cessation encouraged. HSV positive titer - Valtrex at 34 weeks, patient already has Rx. Depression/anxiety - SW to see, will start zoloft, pt advised xanax not recommended for pregnancy Hypokalemia - recheck CMP today; Elevated transaminases - negative hepatitis b or c. Recheck today. Headaches:  Tylenol Normal U/S 05/15/12 - normal fetal anatomy, growth, placenta, cervix. Will get repeat sono for growth - measuring small for dates. Will try thigh-high TED hose. Problem list  reviewed and updated Follow up in 2 weeks. 50% of 45 min visit spent on counseling and coordination of care.

## 2012-06-23 NOTE — Assessment & Plan Note (Signed)
Zoloft prescribed 06/23/12.

## 2012-06-23 NOTE — Assessment & Plan Note (Signed)
Thigh-high TED hose prescribed 06/23/12.

## 2012-06-23 NOTE — Progress Notes (Signed)
U/S scheduled 06/26/12 at 945 am.

## 2012-06-24 ENCOUNTER — Encounter: Payer: Self-pay | Admitting: *Deleted

## 2012-06-24 ENCOUNTER — Encounter: Payer: Self-pay | Admitting: Family Medicine

## 2012-06-24 ENCOUNTER — Telehealth: Payer: Self-pay | Admitting: *Deleted

## 2012-06-24 ENCOUNTER — Telehealth: Payer: Self-pay | Admitting: Family Medicine

## 2012-06-24 MED ORDER — POTASSIUM CHLORIDE ER 10 MEQ PO TBCR: 20.0000 meq | EXTENDED_RELEASE_TABLET | Freq: Every day | ORAL | Status: DC

## 2012-06-24 NOTE — Telephone Encounter (Signed)
CMP on 06/23/12 showed K 3.6 (up from 2.6). Will have pt decrease her potassium supplement from 20 meq bid to daily. Recheck next visit.

## 2012-06-24 NOTE — Telephone Encounter (Signed)
Pt informed

## 2012-06-24 NOTE — Telephone Encounter (Signed)
Message copied by Mannie Stabile on Tue Jun 24, 2012 11:03 AM ------      Message from: FERRY, [place]      Created: Tue Jun 24, 2012  6:56 AM       Please have patient decrease her potassium supplement to 20 mEq (2 tablets) ONCE daily. Thanks.

## 2012-06-26 ENCOUNTER — Ambulatory Visit (HOSPITAL_COMMUNITY)
Admission: RE | Admit: 2012-06-26 | Discharge: 2012-06-26 | Disposition: A | Discharge status: Home / Self Care | Payer: Medicaid Other | Source: Ambulatory Visit | Attending: Family Medicine | Admitting: Family Medicine

## 2012-06-26 ENCOUNTER — Other Ambulatory Visit: Payer: Self-pay | Admitting: Family Medicine

## 2012-06-26 DIAGNOSIS — O26849 Uterine size-date discrepancy, unspecified trimester: Secondary | ICD-10-CM

## 2012-06-26 DIAGNOSIS — Z3689 Encounter for other specified antenatal screening: Secondary | ICD-10-CM | POA: Insufficient documentation

## 2012-06-26 DIAGNOSIS — O365990 Maternal care for other known or suspected poor fetal growth, unspecified trimester, not applicable or unspecified: Secondary | ICD-10-CM | POA: Insufficient documentation

## 2012-06-27 ENCOUNTER — Encounter: Payer: Self-pay | Admitting: Internal Medicine

## 2012-06-27 ENCOUNTER — Ambulatory Visit (INDEPENDENT_AMBULATORY_CARE_PROVIDER_SITE_OTHER): Payer: Medicaid Other | Admitting: Internal Medicine

## 2012-06-27 VITALS — BP 122/70 | HR 106 | Temp 98.4°F | Ht 68.0 in | Wt 144.6 lb

## 2012-06-27 DIAGNOSIS — J45909 Unspecified asthma, uncomplicated: Secondary | ICD-10-CM

## 2012-06-27 DIAGNOSIS — F17200 Nicotine dependence, unspecified, uncomplicated: Secondary | ICD-10-CM

## 2012-06-27 NOTE — Progress Notes (Signed)
Subjective:    Patient ID: Anne Olson, female    DOB: February 21, 1976  MRN: [id number]   HPI 83 yobf smoker dx of asthma as teenager before started smoking, then 1994 pregancy / asthma then admitted to [facility] (only time admitted) w/in one year post partum x 2 weeks with severe asthma, never since, referred by Nigel Bridgeman for evaluation of sob during pregnancy.   05/16/2012 1st pulmonary eval still  smoking @ [redacted] weeks gestation cc sob x across the room onset was w/in first 10 weeks of learning she was pregnant, pattern different as  no better p albuterol,  Her asthma symptoms are typically  Some better with advair, lots better p nebulizer but doesn't have one - last ER 6 months.  Typical triggers for asthma spells are strong fumes > tightness, better with proventil, needs to use daily when goes work. rec The key is to stop smoking completely - it's the most important aspect of your care symbicort 160 Take 2 puffs first thing in am and then another 2 puffs about 12 hours later.  Work on inhaler technique:  relax and gently Please schedule a follow up office visit in 2 weeks, sooner if needed    05/30/2012 f/u Wert ov  Still smoking cc no change doe or need for albuterol, once gets comfortable at night does ok propped up  rec Symbicort 160 2 bid Prn albuterol   06/27/2012 f/u ov/Wert cc only 4-5 x use of ventolin since last ov, no change doe and chest tightness.  No obvious daytime variabilty or assoc chronic cough or cp or chest tightness, subjective wheeze overt sinus or hb symptoms. No unusual exp hx or h/o childhood pna/ asthma or premature birth to her knowledge.   Sleeping ok without nocturnal  or early am exacerbation  of respiratory  c/o's or need for noct saba. Also denies any obvious fluctuation of symptoms with weather or environmental changes or other aggravating or alleviating factors except as outlined above   ROS  The following are not active complaints unless bolded sore throat,  dysphagia, dental problems, itching, sneezing,  nasal congestion or excess/ purulent secretions, ear ache,   fever, chills, sweats, unintended wt loss, pleuritic or exertional cp, hemoptysis,  orthopnea pnd or leg swelling, presyncope, palpitations, heartburn, abdominal pain, anorexia, nausea, vomiting, diarrhea  or change in bowel or urinary habits, change in stools or urine, dysuria,hematuria,  rash, arthralgias, visual complaints, headache, numbness weakness or ataxia or problems with walking or coordination,  change in mood/affect or memory.            Objective:   Physical Exam  Anxious chronically ill appearing thin bf nad   05/30/2012  137 > 144 06/27/2012  Wt Readings from Last 3 Encounters:  05/16/12 136 lb (61.689 kg)  05/12/12 132 lb (59.875 kg)  04/30/12 130 lb (58.968 kg)     HEENT: nl dentition, turbinates, and orophanx. Nl external ear canals without cough reflex   NECK :  without JVD/Nodes/TM/ nl carotid upstrokes bilaterally   LUNGS: no acc muscle use, clear to A and P bilaterally without cough on insp or exp maneuvers   CV:  RRR  no s3 or murmur or increase in P2,  2 plus edema   ABD:  soft and nontender with nl excursion in the supine position. No bruits or organomegaly, bowel sounds nl. C/w [redacted] weeks gestation  MS:  warm without deformities, calf tenderness, cyanosis or clubbing  SKIN: warm and dry without lesions  cxr 03/06/12 No pneumonia or acute cardiopulmonary disease. Subsegmental  atelectasis in the lingula       Assessment & Plan:

## 2012-06-27 NOTE — Patient Instructions (Addendum)
No change in medications but bring your respiratory medications with you on return  Only use your albuterol (blue = ventolin)  as a rescue medication to be used if you can't catch your breath by resting or doing a relaxed purse lip breathing pattern. The less you use it, the better it will work when you need it.   The key is to stop smoking completely before smoking completely stops you - it's the most important aspect of your care   Please schedule a follow up office visit in 4 weeks, sooner if needed  

## 2012-07-02 ENCOUNTER — Telehealth: Payer: Self-pay | Admitting: *Deleted

## 2012-07-02 NOTE — Telephone Encounter (Addendum)
Patient left a message stating that she would like to make an appointment. She is sick and not feeling well. States she thinks she has the flu.

## 2012-07-02 NOTE — Assessment & Plan Note (Signed)
Still smoking but has relatively good control of asthma  Reviewed with pt  If your breathing worsens or you need to use your rescue inhaler more than twice weekly or wake up more than twice a month with any respiratory symptoms or require more than two rescue inhalers per year, we need to see you right away because this means we're not controlling the underlying problem (inflammation) adequately.  Rescue inhalers do not control inflammation and overuse can lead to unnecessary and costly consequences.  They can make you feel better temporarily but eventually they will quit working effectively much as sleep aids lead to more insomnia if used regularly.

## 2012-07-02 NOTE — Assessment & Plan Note (Signed)
>   3 min discussion  Discussed in detail all the usual  risks  To baby of smoking in terms of 02 delivery and the benefits to her but she is not willing to commit to quit at this point

## 2012-07-02 NOTE — Telephone Encounter (Signed)
Called patient back and left her a message that I am returning her call.

## 2012-07-03 NOTE — Telephone Encounter (Signed)
Tried calling patient again this morning, phone just rings.

## 2012-07-04 NOTE — Telephone Encounter (Signed)
Patient never returned call. She is scheduled to be seen on Monday 1/27.

## 2012-07-07 ENCOUNTER — Ambulatory Visit (INDEPENDENT_AMBULATORY_CARE_PROVIDER_SITE_OTHER): Payer: Medicaid Other | Admitting: Obstetrics & Gynecology

## 2012-07-07 VITALS — BP 149/85 | Temp 98.5°F | Wt 141.1 lb

## 2012-07-07 DIAGNOSIS — O0930 Supervision of pregnancy with insufficient antenatal care, unspecified trimester: Secondary | ICD-10-CM

## 2012-07-07 DIAGNOSIS — K649 Unspecified hemorrhoids: Secondary | ICD-10-CM

## 2012-07-07 LAB — POCT URINALYSIS DIP (DEVICE)
Bilirubin Urine: NEGATIVE
Glucose, UA: NEGATIVE mg/dL
Ketones, ur: NEGATIVE mg/dL
Leukocytes, UA: NEGATIVE
Nitrite: NEGATIVE
pH: 6.5 (ref 5.0–8.0)

## 2012-07-07 MED ORDER — HYDROCORTISONE ACE-PRAMOXINE 1-1 % RE FOAM: 1.0000 | Freq: Two times a day (BID) | RECTAL | Status: DC

## 2012-07-07 NOTE — Progress Notes (Signed)
P=124 Patient is experiencing a lot of discomfort related to hemorrhoides.

## 2012-07-07 NOTE — Patient Instructions (Signed)
Pregnancy - Third Trimester  The third trimester of pregnancy (the last 3 months) is a period of the most rapid growth for you and your baby. The baby approaches a length of 20 inches and a weight of 6 to 10 pounds. The baby is adding on fat and getting ready for life outside your body. While inside, babies have periods of sleeping and waking, suck their thumbs, and hiccups. You can often feel small contractions of the uterus. This is false labor. It is also called Braxton-Hicks contractions. This is like a practice for labor. The usual problems in this stage of pregnancy include more difficulty breathing, swelling of the hands and feet from water retention, and having to urinate more often because of the uterus and baby pressing on your bladder.   PRENATAL EXAMS  · Blood work may continue to be done during prenatal exams. These tests are done to check on your health and the probable health of your baby. Blood work is used to follow your blood levels (hemoglobin). Anemia (low hemoglobin) is common during pregnancy. Iron and vitamins are given to help prevent this. You may also continue to be checked for diabetes. Some of the past blood tests may be done again.  · The size of the uterus is measured during each visit. This makes sure your baby is growing properly according to your pregnancy dates.  · Your blood pressure is checked every prenatal visit. This is to make sure you are not getting toxemia.  · Your urine is checked every prenatal visit for infection, diabetes and protein.  · Your weight is checked at each visit. This is done to make sure gains are happening at the suggested rate and that you and your baby are growing normally.  · Sometimes, an ultrasound is performed to confirm the position and the proper growth and development of the baby. This is a test done that bounces harmless sound waves off the baby so your caregiver can more accurately determine due dates.  · Discuss the type of pain medication and  anesthesia you will have during your labor and delivery.  · Discuss the possibility and anesthesia if a Cesarean Section might be necessary.  · Inform your caregiver if there is any mental or physical violence at home.  Sometimes, a specialized non-stress test, contraction stress test and biophysical profile are done to make sure the baby is not having a problem. Checking the amniotic fluid surrounding the baby is called an amniocentesis. The amniotic fluid is removed by sticking a needle into the belly (abdomen). This is sometimes done near the end of pregnancy if an early delivery is required. In this case, it is done to help make sure the baby's lungs are mature enough for the baby to live outside of the womb. If the lungs are not mature and it is unsafe to deliver the baby, an injection of cortisone medication is given to the mother 1 to 2 days before the delivery. This helps the baby's lungs mature and makes it safer to deliver the baby.  CHANGES OCCURING IN THE THIRD TRIMESTER OF PREGNANCY  Your body goes through many changes during pregnancy. They vary from person to person. Talk to your caregiver about changes you notice and are concerned about.  · During the last trimester, you have probably had an increase in your appetite. It is normal to have cravings for certain foods. This varies from person to person and pregnancy to pregnancy.  · You may begin to   get stretch marks on your hips, abdomen, and breasts. These are normal changes in the body during pregnancy. There are no exercises or medications to take which prevent this change.  · Constipation may be treated with a stool softener or adding bulk to your diet. Drinking lots of fluids, fiber in vegetables, fruits, and whole grains are helpful.  · Exercising is also helpful. If you have been very active up until your pregnancy, most of these activities can be continued during your pregnancy. If you have been less active, it is helpful to start an exercise  program such as walking. Consult your caregiver before starting exercise programs.  · Avoid all smoking, alcohol, un-prescribed drugs, herbs and "street drugs" during your pregnancy. These chemicals affect the formation and growth of the baby. Avoid chemicals throughout the pregnancy to ensure the delivery of a healthy infant.  · Backache, varicose veins and hemorrhoids may develop or get worse.  · You will tire more easily in the third trimester, which is normal.  · The baby's movements may be stronger and more often.  · You may become short of breath easily.  · Your belly button may stick out.  · A yellow discharge may leak from your breasts called colostrum.  · You may have a bloody mucus discharge. This usually occurs a few days to a week before labor begins.  HOME CARE INSTRUCTIONS   · Keep your caregiver's appointments. Follow your caregiver's instructions regarding medication use, exercise, and diet.  · During pregnancy, you are providing food for you and your baby. Continue to eat regular, well-balanced meals. Choose foods such as meat, fish, milk and other low fat dairy products, vegetables, fruits, and whole-grain breads and cereals. Your caregiver will tell you of the ideal weight gain.  · A physical sexual relationship may be continued throughout pregnancy if there are no other problems such as early (premature) leaking of amniotic fluid from the membranes, vaginal bleeding, or belly (abdominal) pain.  · Exercise regularly if there are no restrictions. Check with your caregiver if you are unsure of the safety of your exercises. Greater weight gain will occur in the last 2 trimesters of pregnancy. Exercising helps:  · Control your weight.  · Get you in shape for labor and delivery.  · You lose weight after you deliver.  · Rest a lot with legs elevated, or as needed for leg cramps or low back pain.  · Wear a good support or jogging bra for breast tenderness during pregnancy. This may help if worn during  sleep. Pads or tissues may be used in the bra if you are leaking colostrum.  · Do not use hot tubs, steam rooms, or saunas.  · Wear your seat belt when driving. This protects you and your baby if you are in an accident.  · Avoid raw meat, cat litter boxes and soil used by cats. These carry germs that can cause birth defects in the baby.  · It is easier to loose urine during pregnancy. Tightening up and strengthening the pelvic muscles will help with this problem. You can practice stopping your urination while you are going to the bathroom. These are the same muscles you need to strengthen. It is also the muscles you would use if you were trying to stop from passing gas. You can practice tightening these muscles up 10 times a set and repeating this about 3 times per day. Once you know what muscles to tighten up, do not perform these   exercises during urination. It is more likely to cause an infection by backing up the urine.  · Ask for help if you have financial, counseling or nutritional needs during pregnancy. Your caregiver will be able to offer counseling for these needs as well as refer you for other special needs.  · Make a list of emergency phone numbers and have them available.  · Plan on getting help from family or friends when you go home from the hospital.  · Make a trial run to the hospital.  · Take prenatal classes with the father to understand, practice and ask questions about the labor and delivery.  · Prepare the baby's room/nursery.  · Do not travel out of the city unless it is absolutely necessary and with the advice of your caregiver.  · Wear only low or no heal shoes to have better balance and prevent falling.  MEDICATIONS AND DRUG USE IN PREGNANCY  · Take prenatal vitamins as directed. The vitamin should contain 1 milligram of folic acid. Keep all vitamins out of reach of children. Only a couple vitamins or tablets containing iron may be fatal to a baby or young child when ingested.  · Avoid use  of all medications, including herbs, over-the-counter medications, not prescribed or suggested by your caregiver. Only take over-the-counter or prescription medicines for pain, discomfort, or fever as directed by your caregiver. Do not use aspirin, ibuprofen (Motrin®, Advil®, Nuprin®) or naproxen (Aleve®) unless OK'd by your caregiver.  · Let your caregiver also know about herbs you may be using.  · Alcohol is related to a number of birth defects. This includes fetal alcohol syndrome. All alcohol, in any form, should be avoided completely. Smoking will cause low birth rate and premature babies.  · Street/illegal drugs are very harmful to the baby. They are absolutely forbidden. A baby born to an addicted mother will be addicted at birth. The baby will go through the same withdrawal an adult does.  SEEK MEDICAL CARE IF:  You have any concerns or worries during your pregnancy. It is better to call with your questions if you feel they cannot wait, rather than worry about them.  DECISIONS ABOUT CIRCUMCISION  You may or may not know the sex of your baby. If you know your baby is a boy, it may be time to think about circumcision. Circumcision is the removal of the foreskin of the penis. This is the skin that covers the sensitive end of the penis. There is no proven medical need for this. Often this decision is made on what is popular at the time or based upon religious beliefs and social issues. You can discuss these issues with your caregiver or pediatrician.  SEEK IMMEDIATE MEDICAL CARE IF:   · An unexplained oral temperature above 102° F (38.9° C) develops, or as your caregiver suggests.  · You have leaking of fluid from the vagina (birth canal). If leaking membranes are suspected, take your temperature and tell your caregiver of this when you call.  · There is vaginal spotting, bleeding or passing clots. Tell your caregiver of the amount and how many pads are used.  · You develop a bad smelling vaginal discharge with  a change in the color from clear to white.  · You develop vomiting that lasts more than 24 hours.  · You develop chills or fever.  · You develop shortness of breath.  · You develop burning on urination.  · You loose more than 2 pounds of weight   or gain more than 2 pounds of weight or as suggested by your caregiver.  · You notice sudden swelling of your face, hands, and feet or legs.  · You develop belly (abdominal) pain. Round ligament discomfort is a common non-cancerous (benign) cause of abdominal pain in pregnancy. Your caregiver still must evaluate you.  · You develop a severe headache that does not go away.  · You develop visual problems, blurred or double vision.  · If you have not felt your baby move for more than 1 hour. If you think the baby is not moving as much as usual, eat something with sugar in it and lie down on your left side for an hour. The baby should move at least 4 to 5 times per hour. Call right away if your baby moves less than that.  · You fall, are in a car accident or any kind of trauma.  · There is mental or physical violence at home.  Document Released: 05/22/2001 Document Revised: 08/20/2011 Document Reviewed: 11/24/2008  Exit[employer]® Patient Information ©2013 [employer].

## 2012-07-07 NOTE — Progress Notes (Signed)
Proctofoam for hemorrhoids. RTC 1 week. Chest clear

## 2012-07-14 ENCOUNTER — Ambulatory Visit (INDEPENDENT_AMBULATORY_CARE_PROVIDER_SITE_OTHER): Payer: Medicaid Other | Admitting: Obstetrics & Gynecology

## 2012-07-14 VITALS — BP 147/81 | Temp 98.6°F | Wt 142.2 lb

## 2012-07-14 DIAGNOSIS — O09529 Supervision of elderly multigravida, unspecified trimester: Secondary | ICD-10-CM

## 2012-07-14 DIAGNOSIS — O09299 Supervision of pregnancy with other poor reproductive or obstetric history, unspecified trimester: Secondary | ICD-10-CM

## 2012-07-14 LAB — POCT URINALYSIS DIP (DEVICE)
Glucose, UA: NEGATIVE mg/dL
Ketones, ur: NEGATIVE mg/dL
Leukocytes, UA: NEGATIVE
Protein, ur: NEGATIVE mg/dL
Specific Gravity, Urine: 1.01 (ref 1.005–1.030)

## 2012-07-14 LAB — CBC
HCT: 30.9 % — ABNORMAL LOW (ref 36.0–46.0)
MCV: 88 fL (ref 78.0–100.0)
Platelets: 301 10*3/uL (ref 150–400)
RBC: 3.51 MIL/uL — ABNORMAL LOW (ref 3.87–5.11)
RDW: 14.1 % (ref 11.5–15.5)
WBC: 15.2 10*3/uL — ABNORMAL HIGH (ref 4.0–10.5)

## 2012-07-14 LAB — COMPREHENSIVE METABOLIC PANEL
ALT: <8 U/L (ref 0–35)
AST: 13 U/L (ref 0–37)
Albumin: 3 g/dL — ABNORMAL LOW (ref 3.5–5.2)
BUN: 3 mg/dL — ABNORMAL LOW (ref 6–23)
CO2: 19 mEq/L (ref 19–32)
Calcium: 8.6 mg/dL (ref 8.4–10.5)
Chloride: 112 mEq/L (ref 96–112)
Creat: 0.44 mg/dL — ABNORMAL LOW (ref 0.50–1.10)
Potassium: 3.6 mEq/L (ref 3.5–5.3)

## 2012-07-14 NOTE — Progress Notes (Signed)
Some improvement with hemorrhoid sx.  Wheezes at night, to see Dr. Sherene Sires next week, has inhalers. Has headache and pedal edam, gave PIH precautions. CBC and CMP today RTC 3 days

## 2012-07-14 NOTE — Patient Instructions (Signed)
Preeclampsia and Eclampsia Preeclampsia is a condition of high blood pressure during pregnancy. It can happen at 20 weeks or later in pregnancy. If high blood pressure occurs in the second half of pregnancy with no other symptoms, it is called gestational hypertension and goes away after the baby is born. If any of the symptoms listed below develop with gestational hypertension, it is then called preeclampsia. Eclampsia (convulsions) may follow preeclampsia. This is one of the reasons for regular prenatal checkups. Early diagnosis and treatment are very important to prevent eclampsia. CAUSES  There is no known cause of preeclampsia/eclampsia in pregnancy. There are several known conditions that may put the pregnant woman at risk, such as:  The first pregnancy.  Having preeclampsia in a past pregnancy.  Having lasting (chronic) high blood pressure.  Having multiples (twins, triplets).  Being age 35 or older.  African American ethnic background.  Having kidney disease or diabetes.  Medical conditions such as lupus or blood diseases.  Being overweight (obese). SYMPTOMS   High blood pressure.  Headaches.  Sudden weight gain.  Swelling of hands, face, legs, and feet.  Protein in the urine.  Feeling sick to your stomach (nauseous) and throwing up (vomiting).  Vision problems (blurred or double vision).  Numbness in the face, arms, legs, and feet.  Dizziness.  Slurred speech.  Preeclampsia can cause growth retardation in the fetus.  Separation (abruption) of the placenta.  Not enough fluid in the amniotic sac (oligohydramnios).  Sensitivity to bright lights.  Belly (abdominal) pain. DIAGNOSIS  If protein is found in the urine in the second half of pregnancy, this is considered preeclampsia. Other symptoms mentioned above may also be present. TREATMENT  It is necessary to treat this.  Your caregiver may prescribe bed rest early in this condition. Plenty of rest and  salt restriction may be all that is needed.  Medicines may be necessary to lower blood pressure if the condition does not respond to more conservative measures.  In more severe cases, hospitalization may be needed:  For treatment of blood pressure.  To control fluid retention.  To monitor the baby to see if the condition is causing harm to the baby.  Hospitalization is the best way to treat the first sign of preeclampsia. This is so the mother and baby can be watched closely and blood tests can be done effectively and correctly.  If the condition becomes severe, it may be necessary to induce labor or to remove the infant by surgical means (cesarean section). The best cure for preeclampsia/eclampsia is to deliver the baby. Preeclampsia and eclampsia involve risks to mother and infant. Your caregiver will discuss these risks with you. Together, you can work out the best possible approach to your problems. Make sure you keep your prenatal visits as scheduled. Not keeping appointments could result in a chronic or permanent injury, pain, disability to you, and death or injury to you or your unborn baby. If there is any problem keeping the appointment, you must call to reschedule. HOME CARE INSTRUCTIONS   Keep your prenatal appointments and tests as scheduled.  Tell your caregiver if you have any of the above risk factors.  Get plenty of rest and sleep.  Eat a balanced diet that is low in salt, and do not add salt to your food.  Avoid stressful situations.  Only take over-the-counter and prescriptions medicines for pain, discomfort, or fever as directed by your caregiver. SEEK IMMEDIATE MEDICAL CARE IF:   You develop severe swelling   anywhere in the body. This usually occurs in the legs.  You gain 5 lb/2.3 kg or more in a week.  You develop a severe headache, dizziness, problems with your vision, or confusion.  You have abdominal pain, nausea, or vomiting.  You have a seizure.  You  have trouble moving any part of your body, or you develop numbness or problems speaking.  You have bruising or abnormal bleeding from anywhere in the body.  You develop a stiff neck.  You pass out. MAKE SURE YOU:   Understand these instructions.  Will watch your condition.  Will get help right away if you are not doing well or get worse. Document Released: 05/25/2000 Document Revised: 08/20/2011 Document Reviewed: 01/09/2008 Exit[employer] Patient Information 2013 [employer].  

## 2012-07-14 NOTE — Progress Notes (Signed)
Pulse- 118  Edema-feet/legs

## 2012-07-17 ENCOUNTER — Ambulatory Visit (INDEPENDENT_AMBULATORY_CARE_PROVIDER_SITE_OTHER): Payer: Medicaid Other | Admitting: Family

## 2012-07-17 ENCOUNTER — Encounter: Payer: Self-pay | Admitting: Family

## 2012-07-17 VITALS — BP 146/84 | Temp 97.1°F | Wt 141.3 lb

## 2012-07-17 DIAGNOSIS — O139 Gestational [pregnancy-induced] hypertension without significant proteinuria, unspecified trimester: Secondary | ICD-10-CM

## 2012-07-17 DIAGNOSIS — O09529 Supervision of elderly multigravida, unspecified trimester: Secondary | ICD-10-CM

## 2012-07-17 DIAGNOSIS — O09899 Supervision of other high risk pregnancies, unspecified trimester: Secondary | ICD-10-CM

## 2012-07-17 DIAGNOSIS — O289 Unspecified abnormal findings on antenatal screening of mother: Secondary | ICD-10-CM

## 2012-07-17 HISTORY — DX: Gestational (pregnancy-induced) hypertension without significant proteinuria, unspecified trimester: O13.9

## 2012-07-17 LAB — POCT URINALYSIS DIP (DEVICE)
Glucose, UA: NEGATIVE mg/dL
Leukocytes, UA: NEGATIVE
Protein, ur: NEGATIVE mg/dL
Urobilinogen, UA: 0.2 mg/dL (ref 0.0–1.0)

## 2012-07-17 MED ORDER — VALACYCLOVIR HCL 500 MG PO TABS: 500.0000 mg | ORAL_TABLET | Freq: Two times a day (BID) | ORAL | Status: DC

## 2012-07-17 NOTE — Progress Notes (Signed)
Pulse- 120  Edema-feet, legs, ankles

## 2012-07-17 NOTE — Progress Notes (Signed)
Appt with Dr. Sherene Sires scheduled next week; CBC and CMP wnl; NST today and collect 24 hr urine.  Begin 2x/week testing.  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

## 2012-07-21 ENCOUNTER — Encounter: Payer: Self-pay | Admitting: *Deleted

## 2012-07-21 ENCOUNTER — Ambulatory Visit (INDEPENDENT_AMBULATORY_CARE_PROVIDER_SITE_OTHER): Payer: Medicaid Other | Admitting: *Deleted

## 2012-07-21 VITALS — BP 121/74 | Wt 139.8 lb

## 2012-07-21 DIAGNOSIS — O139 Gestational [pregnancy-induced] hypertension without significant proteinuria, unspecified trimester: Secondary | ICD-10-CM

## 2012-07-21 LAB — COMPREHENSIVE METABOLIC PANEL
BUN: 3 mg/dL — ABNORMAL LOW (ref 6–23)
CO2: 20 mEq/L (ref 19–32)
Creat: 0.39 mg/dL — ABNORMAL LOW (ref 0.50–1.10)
Glucose, Bld: 57 mg/dL — ABNORMAL LOW (ref 70–99)
Total Bilirubin: 0.3 mg/dL (ref 0.3–1.2)
Total Protein: 6.1 g/dL (ref 6.0–8.3)

## 2012-07-21 LAB — CBC
HCT: 31 % — ABNORMAL LOW (ref 36.0–46.0)
Hemoglobin: 10.5 g/dL — ABNORMAL LOW (ref 12.0–15.0)
MCH: 29.6 pg (ref 26.0–34.0)
MCHC: 33.9 g/dL (ref 30.0–36.0)
MCV: 87.3 fL (ref 78.0–100.0)
RBC: 3.55 MIL/uL — ABNORMAL LOW (ref 3.87–5.11)

## 2012-07-21 NOTE — Progress Notes (Signed)
NST reactive on 07/21/12

## 2012-07-21 NOTE — Progress Notes (Signed)
P - 105 

## 2012-07-22 LAB — PROTEIN, URINE, 24 HOUR
Protein, 24H Urine: 120 mg/d — ABNORMAL HIGH (ref 50–100)
Protein, Urine: 5 mg/dL

## 2012-07-24 ENCOUNTER — Other Ambulatory Visit: Payer: Medicaid Other

## 2012-07-25 ENCOUNTER — Other Ambulatory Visit: Payer: Self-pay | Admitting: Obstetrics and Gynecology

## 2012-07-25 ENCOUNTER — Ambulatory Visit: Payer: Medicaid Other | Admitting: Internal Medicine

## 2012-07-25 NOTE — Telephone Encounter (Signed)
Msg received to [facility]. Pt is currently a patient at [facility]. VM left for [facility], [address] to contact that office.

## 2012-07-28 ENCOUNTER — Ambulatory Visit (INDEPENDENT_AMBULATORY_CARE_PROVIDER_SITE_OTHER): Payer: Medicaid Other | Admitting: *Deleted

## 2012-07-28 VITALS — BP 146/70 | Wt 143.3 lb

## 2012-07-28 DIAGNOSIS — O139 Gestational [pregnancy-induced] hypertension without significant proteinuria, unspecified trimester: Secondary | ICD-10-CM

## 2012-07-28 NOTE — Progress Notes (Signed)
P-96 

## 2012-07-28 NOTE — Progress Notes (Signed)
NST reviewed and reactive.  Teale Goodgame L. Harraway-Smith, M.D., FACOG    

## 2012-07-31 ENCOUNTER — Encounter: Payer: Self-pay | Admitting: Internal Medicine

## 2012-07-31 ENCOUNTER — Other Ambulatory Visit: Payer: Self-pay | Admitting: Obstetrics & Gynecology

## 2012-07-31 ENCOUNTER — Encounter: Payer: Self-pay | Admitting: *Deleted

## 2012-07-31 ENCOUNTER — Ambulatory Visit (INDEPENDENT_AMBULATORY_CARE_PROVIDER_SITE_OTHER): Payer: Medicaid Other | Admitting: Obstetrics and Gynecology

## 2012-07-31 ENCOUNTER — Ambulatory Visit (INDEPENDENT_AMBULATORY_CARE_PROVIDER_SITE_OTHER): Payer: Medicaid Other | Admitting: Internal Medicine

## 2012-07-31 ENCOUNTER — Other Ambulatory Visit (HOSPITAL_COMMUNITY)
Admission: RE | Admit: 2012-07-31 | Discharge: 2012-07-31 | Disposition: A | Discharge status: Home / Self Care | Payer: Medicaid Other | Source: Ambulatory Visit | Attending: Obstetrics & Gynecology | Admitting: Obstetrics & Gynecology

## 2012-07-31 VITALS — BP 135/86 | Temp 96.6°F | Wt 143.5 lb

## 2012-07-31 VITALS — BP 124/70 | HR 116 | Temp 97.9°F | Ht 68.5 in | Wt 146.2 lb

## 2012-07-31 DIAGNOSIS — F17200 Nicotine dependence, unspecified, uncomplicated: Secondary | ICD-10-CM

## 2012-07-31 DIAGNOSIS — O09299 Supervision of pregnancy with other poor reproductive or obstetric history, unspecified trimester: Secondary | ICD-10-CM

## 2012-07-31 DIAGNOSIS — J45909 Unspecified asthma, uncomplicated: Secondary | ICD-10-CM

## 2012-07-31 DIAGNOSIS — Z113 Encounter for screening for infections with a predominantly sexual mode of transmission: Secondary | ICD-10-CM | POA: Insufficient documentation

## 2012-07-31 DIAGNOSIS — B009 Herpesviral infection, unspecified: Secondary | ICD-10-CM

## 2012-07-31 DIAGNOSIS — Z23 Encounter for immunization: Secondary | ICD-10-CM

## 2012-07-31 DIAGNOSIS — M419 Scoliosis, unspecified: Secondary | ICD-10-CM

## 2012-07-31 DIAGNOSIS — O0930 Supervision of pregnancy with insufficient antenatal care, unspecified trimester: Secondary | ICD-10-CM

## 2012-07-31 DIAGNOSIS — O09529 Supervision of elderly multigravida, unspecified trimester: Secondary | ICD-10-CM

## 2012-07-31 DIAGNOSIS — O139 Gestational [pregnancy-induced] hypertension without significant proteinuria, unspecified trimester: Secondary | ICD-10-CM

## 2012-07-31 DIAGNOSIS — M4120 Other idiopathic scoliosis, site unspecified: Secondary | ICD-10-CM

## 2012-07-31 LAB — POCT URINALYSIS DIP (DEVICE)
Bilirubin Urine: NEGATIVE
Glucose, UA: NEGATIVE mg/dL
Ketones, ur: NEGATIVE mg/dL
Specific Gravity, Urine: 1.015 (ref 1.005–1.030)
Urobilinogen, UA: 0.2 mg/dL (ref 0.0–1.0)

## 2012-07-31 MED ORDER — TETANUS-DIPHTH-ACELL PERTUSSIS 5-2.5-18.5 LF-MCG/0.5 IM SUSP
0.5000 mL | Freq: Once | INTRAMUSCULAR | Status: AC
  Administered 2012-07-31: 0.5 mL via INTRAMUSCULAR

## 2012-07-31 MED ORDER — PHENYLEPH-SHARK LIV OIL-MO-PET 0.25-3-14-71.9 % RE OINT: TOPICAL_OINTMENT | Freq: Two times a day (BID) | RECTAL | Status: DC | PRN

## 2012-07-31 NOTE — Progress Notes (Signed)
Pt requests additional Rx for hemorrhoids- using proctofoam but still very swollen and painful- Dr. Jolayne Panther informed.

## 2012-07-31 NOTE — Progress Notes (Signed)
Patient doing well without complaints. Cultures collected today. FM/labor precautions reviewed.

## 2012-07-31 NOTE — Patient Instructions (Addendum)
Plan A  = Automatic Symbicort 160 2 twice daily  Only use your albuterol (blue = ventolin/ yellow proventil)  as a rescue medication to be used if you can't catch your breath by resting or doing a relaxed purse lip breathing pattern. The less you use it, the better it will work when you need it.   The key is to stop smoking completely before smoking completely stops you - it's the most important aspect of your care   Please schedule a follow up office visit in 8 weeks, sooner if needed with full pft's on return

## 2012-07-31 NOTE — Progress Notes (Signed)
P=125, trace edema in legs/feet, desires tdap today

## 2012-07-31 NOTE — Progress Notes (Signed)
Subjective:    Patient ID: Anne Olson, female    DOB: 05/10/1976  MRN: [id number]   HPI 77 yobf smoker dx of asthma as teenager before started smoking, then 1994 pregancy / asthma then admitted to [facility] (only time admitted) w/in one year post partum x 2 weeks with severe asthma, never since, referred by Nigel Bridgeman for evaluation of sob during pregnancy.   05/16/2012 1st pulmonary eval still  smoking @ [redacted] weeks gestation cc sob x across the room onset was w/in first 10 weeks of learning she was pregnant, pattern different as  no better p albuterol,  Her asthma symptoms are typically  Some better with advair, lots better p nebulizer but doesn't have one - last ER 6 months.  Typical triggers for asthma spells are strong fumes > tightness, better with proventil, needs to use daily when goes work. rec The key is to stop smoking completely - it's the most important aspect of your care symbicort 160 Take 2 puffs first thing in am and then another 2 puffs about 12 hours later.  Work on inhaler technique:  relax and gently Please schedule a follow up office visit in 2 weeks, sooner if needed    05/30/2012 f/u Betta Balla ov  Still smoking cc no change doe or need for albuterol, once gets comfortable at night does ok propped up  rec Symbicort 160 2 bid Prn albuterol   06/27/2012 f/u ov/Lesette Frary cc only 4-5 x use of ventolin since last ov, no change doe and chest tightness rec No change in medications but bring your respiratory medications with you on return Only use your albuterol (blue = ventolin) as rescue   07/31/2012 f/u ov/Sammi Stolarz  Still smoking cc breathing is about the same, cough at hs > clear mucus   No obvious daytime variabilty or assoc cp or chest tightness, subjective wheeze overt sinus or hb symptoms. No unusual exp hx or h/o childhood pna/ asthma or premature birth to her knowledge.   Sleeping ok without nocturnal  or early am exacerbation  of respiratory  c/o's or need for noct saba. Also  denies any obvious fluctuation of symptoms with weather or environmental changes or other aggravating or alleviating factors except as outlined above   ROS  The following are not active complaints unless bolded sore throat, dysphagia, dental problems, itching, sneezing,  nasal congestion or excess/ purulent secretions, ear ache,   fever, chills, sweats, unintended wt loss, pleuritic or exertional cp, hemoptysis,  orthopnea pnd or leg swelling, presyncope, palpitations, heartburn, abdominal pain, anorexia, nausea, vomiting, diarrhea  or change in bowel or urinary habits, change in stools or urine, dysuria,hematuria,  rash, arthralgias, visual complaints, headache, numbness weakness or ataxia or problems with walking or coordination,  change in mood/affect or memory.            Objective:   Physical Exam  Anxious chronically ill appearing thin bf nad   05/30/2012  137 > 144 06/27/2012  Wt Readings from Last 3 Encounters:  05/16/12 136 lb (61.689 kg)  05/12/12 132 lb (59.875 kg)  04/30/12 130 lb (58.968 kg)     HEENT: nl dentition, turbinates, and orophanx. Nl external ear canals without cough reflex   NECK :  without JVD/Nodes/TM/ nl carotid upstrokes bilaterally   LUNGS: no acc muscle use, clear to A and P bilaterally without cough on insp or exp maneuvers   CV:  RRR  no s3 or murmur or increase in P2,  2 plus edema  ABD:  soft and nontender with nl excursion in the supine position. No bruits or organomegaly, bowel sounds nl. C/w [redacted] weeks gestation  MS:  warm without deformities, calf tenderness, cyanosis or clubbing  SKIN: warm and dry without lesions         cxr 03/06/12 No pneumonia or acute cardiopulmonary disease. Subsegmental  atelectasis in the lingula       Assessment & Plan:

## 2012-08-02 NOTE — Assessment & Plan Note (Signed)
>   3 min discussion  Discussed again the risk she was taking at age 37 and actively smoking @ [redacted] week gestation but would not commit to smoking cessation at this point.

## 2012-08-02 NOTE — Assessment & Plan Note (Signed)
All goals of chronic asthma control met including optimal function and elimination of symptoms with minimal need for rescue therapy.  Contingencies discussed in full including contacting this office immediately if not controlling the symptoms using the rule of two's.    

## 2012-08-03 LAB — CULTURE, BETA STREP (GROUP B ONLY)

## 2012-08-04 ENCOUNTER — Ambulatory Visit (INDEPENDENT_AMBULATORY_CARE_PROVIDER_SITE_OTHER): Payer: Medicaid Other | Admitting: *Deleted

## 2012-08-04 ENCOUNTER — Encounter: Payer: Self-pay | Admitting: Obstetrics and Gynecology

## 2012-08-04 VITALS — BP 124/74 | Wt 143.9 lb

## 2012-08-04 DIAGNOSIS — O133 Gestational [pregnancy-induced] hypertension without significant proteinuria, third trimester: Secondary | ICD-10-CM

## 2012-08-04 DIAGNOSIS — O139 Gestational [pregnancy-induced] hypertension without significant proteinuria, unspecified trimester: Secondary | ICD-10-CM

## 2012-08-04 NOTE — Progress Notes (Signed)
P=93 

## 2012-08-07 ENCOUNTER — Other Ambulatory Visit: Payer: Self-pay | Admitting: Family Medicine

## 2012-08-07 ENCOUNTER — Ambulatory Visit (INDEPENDENT_AMBULATORY_CARE_PROVIDER_SITE_OTHER): Payer: Medicaid Other | Admitting: Family Medicine

## 2012-08-07 VITALS — BP 145/78 | Temp 97.7°F | Wt 144.3 lb

## 2012-08-07 DIAGNOSIS — O139 Gestational [pregnancy-induced] hypertension without significant proteinuria, unspecified trimester: Secondary | ICD-10-CM

## 2012-08-07 DIAGNOSIS — O133 Gestational [pregnancy-induced] hypertension without significant proteinuria, third trimester: Secondary | ICD-10-CM

## 2012-08-07 LAB — COMPREHENSIVE METABOLIC PANEL
ALT: 8 U/L (ref 0–35)
AST: 12 U/L (ref 0–37)
Albumin: 3 g/dL — ABNORMAL LOW (ref 3.5–5.2)
Alkaline Phosphatase: 90 U/L (ref 39–117)
BUN: 4 mg/dL — ABNORMAL LOW (ref 6–23)
Potassium: 3.5 mEq/L (ref 3.5–5.3)
Sodium: 138 mEq/L (ref 135–145)
Total Protein: 6.1 g/dL (ref 6.0–8.3)

## 2012-08-07 LAB — POCT URINALYSIS DIP (DEVICE)
Hgb urine dipstick: NEGATIVE
Ketones, ur: NEGATIVE mg/dL
Protein, ur: 30 mg/dL — AB
Specific Gravity, Urine: 1.02 (ref 1.005–1.030)

## 2012-08-07 LAB — CBC
MCHC: 33.5 g/dL (ref 30.0–36.0)
Platelets: 302 10*3/uL (ref 150–400)
RDW: 14.5 % (ref 11.5–15.5)
WBC: 16.5 10*3/uL — ABNORMAL HIGH (ref 4.0–10.5)

## 2012-08-07 NOTE — Progress Notes (Signed)
P=94, c/o pelvic pressure on bladder,

## 2012-08-07 NOTE — Progress Notes (Signed)
NST reviewed and reactive. Labs and urine for pr/cr ratio today.

## 2012-08-08 LAB — PROTEIN / CREATININE RATIO, URINE: Protein Creatinine Ratio: 0.18 — ABNORMAL HIGH (ref <0.15)

## 2012-08-11 ENCOUNTER — Ambulatory Visit (HOSPITAL_COMMUNITY)
Admission: RE | Admit: 2012-08-11 | Discharge: 2012-08-11 | Disposition: A | Discharge status: Home / Self Care | Payer: Medicaid Other | Source: Ambulatory Visit | Attending: Obstetrics & Gynecology | Admitting: Obstetrics & Gynecology

## 2012-08-11 ENCOUNTER — Ambulatory Visit (INDEPENDENT_AMBULATORY_CARE_PROVIDER_SITE_OTHER): Payer: Medicaid Other | Admitting: *Deleted

## 2012-08-11 VITALS — BP 130/76 | Wt 141.5 lb

## 2012-08-11 DIAGNOSIS — O09899 Supervision of other high risk pregnancies, unspecified trimester: Secondary | ICD-10-CM

## 2012-08-11 DIAGNOSIS — Z3689 Encounter for other specified antenatal screening: Secondary | ICD-10-CM | POA: Insufficient documentation

## 2012-08-11 DIAGNOSIS — O133 Gestational [pregnancy-induced] hypertension without significant proteinuria, third trimester: Secondary | ICD-10-CM

## 2012-08-11 DIAGNOSIS — O139 Gestational [pregnancy-induced] hypertension without significant proteinuria, unspecified trimester: Secondary | ICD-10-CM | POA: Insufficient documentation

## 2012-08-11 DIAGNOSIS — M419 Scoliosis, unspecified: Secondary | ICD-10-CM

## 2012-08-11 DIAGNOSIS — N83209 Unspecified ovarian cyst, unspecified side: Secondary | ICD-10-CM

## 2012-08-11 DIAGNOSIS — B009 Herpesviral infection, unspecified: Secondary | ICD-10-CM

## 2012-08-11 DIAGNOSIS — O09523 Supervision of elderly multigravida, third trimester: Secondary | ICD-10-CM

## 2012-08-11 DIAGNOSIS — R600 Localized edema: Secondary | ICD-10-CM

## 2012-08-11 DIAGNOSIS — F419 Anxiety disorder, unspecified: Secondary | ICD-10-CM

## 2012-08-11 DIAGNOSIS — N760 Acute vaginitis: Secondary | ICD-10-CM

## 2012-08-11 DIAGNOSIS — O09529 Supervision of elderly multigravida, unspecified trimester: Secondary | ICD-10-CM | POA: Insufficient documentation

## 2012-08-11 DIAGNOSIS — Q369 Cleft lip, unilateral: Secondary | ICD-10-CM

## 2012-08-11 DIAGNOSIS — F17200 Nicotine dependence, unspecified, uncomplicated: Secondary | ICD-10-CM

## 2012-08-11 DIAGNOSIS — J45909 Unspecified asthma, uncomplicated: Secondary | ICD-10-CM

## 2012-08-11 DIAGNOSIS — B9689 Other specified bacterial agents as the cause of diseases classified elsewhere: Secondary | ICD-10-CM

## 2012-08-11 DIAGNOSIS — O0933 Supervision of pregnancy with insufficient antenatal care, third trimester: Secondary | ICD-10-CM

## 2012-08-11 DIAGNOSIS — O09293 Supervision of pregnancy with other poor reproductive or obstetric history, third trimester: Secondary | ICD-10-CM

## 2012-08-11 NOTE — Progress Notes (Signed)
P=99 

## 2012-08-11 NOTE — Progress Notes (Signed)
NST reviewed and reactive.  

## 2012-08-11 NOTE — Progress Notes (Signed)
US growth today 

## 2012-08-12 ENCOUNTER — Encounter (HOSPITAL_COMMUNITY): Payer: Self-pay | Admitting: Obstetrics

## 2012-08-12 ENCOUNTER — Encounter (HOSPITAL_COMMUNITY): Payer: Self-pay | Admitting: *Deleted

## 2012-08-12 ENCOUNTER — Inpatient Hospital Stay (HOSPITAL_COMMUNITY)
Admission: AD | Admit: 2012-08-12 | Discharge: 2012-08-14 | DRG: 775 | Disposition: A | Discharge status: Home / Self Care | Payer: Medicaid Other | Source: Ambulatory Visit | Attending: Obstetrics & Gynecology | Admitting: Obstetrics & Gynecology

## 2012-08-12 ENCOUNTER — Inpatient Hospital Stay (HOSPITAL_COMMUNITY): Payer: Medicaid Other

## 2012-08-12 ENCOUNTER — Inpatient Hospital Stay (HOSPITAL_COMMUNITY)
Admission: AD | Admit: 2012-08-12 | Discharge: 2012-08-12 | Disposition: A | Discharge status: Home / Self Care | Payer: Medicaid Other | Source: Ambulatory Visit | Attending: Family Medicine | Admitting: Family Medicine

## 2012-08-12 DIAGNOSIS — O9903 Anemia complicating the puerperium: Secondary | ICD-10-CM

## 2012-08-12 DIAGNOSIS — D649 Anemia, unspecified: Secondary | ICD-10-CM

## 2012-08-12 DIAGNOSIS — O09529 Supervision of elderly multigravida, unspecified trimester: Secondary | ICD-10-CM

## 2012-08-12 DIAGNOSIS — O479 False labor, unspecified: Secondary | ICD-10-CM

## 2012-08-12 DIAGNOSIS — O368390 Maternal care for abnormalities of the fetal heart rate or rhythm, unspecified trimester, not applicable or unspecified: Secondary | ICD-10-CM

## 2012-08-12 DIAGNOSIS — O471 False labor at or after 37 completed weeks of gestation: Secondary | ICD-10-CM

## 2012-08-12 LAB — CBC
Hemoglobin: 12 g/dL (ref 12.0–15.0)
MCH: 30.8 pg (ref 26.0–34.0)
RBC: 3.9 MIL/uL (ref 3.87–5.11)
WBC: 22.8 10*3/uL — ABNORMAL HIGH (ref 4.0–10.5)

## 2012-08-12 LAB — TYPE AND SCREEN: Antibody Screen: NEGATIVE

## 2012-08-12 MED ORDER — WITCH HAZEL-GLYCERIN EX PADS
1.0000 "application " | MEDICATED_PAD | CUTANEOUS | Status: DC | PRN
  Administered 2012-08-14: 1 via TOPICAL

## 2012-08-12 MED ORDER — SIMETHICONE 80 MG PO CHEW: 80.0000 mg | CHEWABLE_TABLET | ORAL | Status: DC | PRN

## 2012-08-12 MED ORDER — TETANUS-DIPHTH-ACELL PERTUSSIS 5-2.5-18.5 LF-MCG/0.5 IM SUSP: 0.5000 mL | Freq: Once | INTRAMUSCULAR | Status: DC

## 2012-08-12 MED ORDER — ACETAMINOPHEN 325 MG PO TABS: 650.0000 mg | ORAL_TABLET | ORAL | Status: DC | PRN

## 2012-08-12 MED ORDER — OXYTOCIN 40 UNITS IN LACTATED RINGERS INFUSION - SIMPLE MED
62.5000 mL/h | INTRAVENOUS | Status: DC
  Filled 2012-08-12: qty 1000

## 2012-08-12 MED ORDER — SENNOSIDES-DOCUSATE SODIUM 8.6-50 MG PO TABS
2.0000 | ORAL_TABLET | Freq: Every day | ORAL | Status: DC
  Administered 2012-08-13: 2 via ORAL

## 2012-08-12 MED ORDER — OXYCODONE-ACETAMINOPHEN 5-325 MG PO TABS: 1.0000 | ORAL_TABLET | ORAL | Status: DC | PRN

## 2012-08-12 MED ORDER — OXYCODONE-ACETAMINOPHEN 5-325 MG PO TABS
1.0000 | ORAL_TABLET | ORAL | Status: DC | PRN
  Administered 2012-08-12: 1 via ORAL
  Administered 2012-08-13 (×2): 2 via ORAL
  Administered 2012-08-13: 1 via ORAL
  Administered 2012-08-14 (×2): 2 via ORAL
  Filled 2012-08-12: qty 2
  Filled 2012-08-12 (×2): qty 1
  Filled 2012-08-12 (×3): qty 2

## 2012-08-12 MED ORDER — FLEET ENEMA 7-19 GM/118ML RE ENEM: 1.0000 | ENEMA | RECTAL | Status: DC | PRN

## 2012-08-12 MED ORDER — IBUPROFEN 600 MG PO TABS
600.0000 mg | ORAL_TABLET | Freq: Four times a day (QID) | ORAL | Status: DC
  Administered 2012-08-13 – 2012-08-14 (×6): 600 mg via ORAL
  Filled 2012-08-12 (×7): qty 1

## 2012-08-12 MED ORDER — ONDANSETRON HCL 4 MG/2ML IJ SOLN: 4.0000 mg | Freq: Four times a day (QID) | INTRAMUSCULAR | Status: DC | PRN

## 2012-08-12 MED ORDER — ACETAMINOPHEN 500 MG PO TABS
1000.0000 mg | ORAL_TABLET | Freq: Once | ORAL | Status: AC
  Administered 2012-08-12: 1000 mg via ORAL
  Filled 2012-08-12: qty 2

## 2012-08-12 MED ORDER — BENZOCAINE-MENTHOL 20-0.5 % EX AERO
1.0000 "application " | INHALATION_SPRAY | CUTANEOUS | Status: DC | PRN
  Administered 2012-08-13: 1 via TOPICAL
  Filled 2012-08-12 (×2): qty 56

## 2012-08-12 MED ORDER — LIDOCAINE HCL (PF) 1 % IJ SOLN
30.0000 mL | INTRAMUSCULAR | Status: DC | PRN
  Filled 2012-08-12: qty 30

## 2012-08-12 MED ORDER — IBUPROFEN 600 MG PO TABS
600.0000 mg | ORAL_TABLET | Freq: Four times a day (QID) | ORAL | Status: DC | PRN
  Administered 2012-08-12: 600 mg via ORAL
  Filled 2012-08-12: qty 1

## 2012-08-12 MED ORDER — DIBUCAINE 1 % RE OINT
1.0000 "application " | TOPICAL_OINTMENT | RECTAL | Status: DC | PRN
  Filled 2012-08-12: qty 28

## 2012-08-12 MED ORDER — LANOLIN HYDROUS EX OINT: TOPICAL_OINTMENT | CUTANEOUS | Status: DC | PRN

## 2012-08-12 MED ORDER — OXYTOCIN 10 UNIT/ML IJ SOLN
10.0000 [IU] | INTRAMUSCULAR | Status: DC | PRN
  Filled 2012-08-12: qty 1

## 2012-08-12 MED ORDER — ONDANSETRON HCL 4 MG PO TABS: 4.0000 mg | ORAL_TABLET | ORAL | Status: DC | PRN

## 2012-08-12 MED ORDER — LACTATED RINGERS IV SOLN: 500.0000 mL | INTRAVENOUS | Status: DC | PRN

## 2012-08-12 MED ORDER — ZOLPIDEM TARTRATE 5 MG PO TABS: 5.0000 mg | ORAL_TABLET | Freq: Every evening | ORAL | Status: DC | PRN

## 2012-08-12 MED ORDER — OXYTOCIN BOLUS FROM INFUSION: 500.0000 mL | INTRAVENOUS | Status: DC

## 2012-08-12 MED ORDER — DIPHENHYDRAMINE HCL 25 MG PO CAPS: 25.0000 mg | ORAL_CAPSULE | Freq: Four times a day (QID) | ORAL | Status: DC | PRN

## 2012-08-12 MED ORDER — ONDANSETRON HCL 4 MG/2ML IJ SOLN: 4.0000 mg | INTRAMUSCULAR | Status: DC | PRN

## 2012-08-12 MED ORDER — PRENATAL MULTIVITAMIN CH
1.0000 | ORAL_TABLET | Freq: Every day | ORAL | Status: DC
  Administered 2012-08-13 – 2012-08-14 (×2): 1 via ORAL
  Filled 2012-08-12 (×2): qty 1

## 2012-08-12 MED ORDER — LACTATED RINGERS IV SOLN: INTRAVENOUS | Status: DC

## 2012-08-12 MED ORDER — OXYTOCIN 10 UNIT/ML IJ SOLN
INTRAMUSCULAR | Status: AC
  Administered 2012-08-12: 10 [IU] via INTRAMUSCULAR
  Filled 2012-08-12: qty 1

## 2012-08-12 MED ORDER — ZOLPIDEM TARTRATE 5 MG PO TABS
5.0000 mg | ORAL_TABLET | Freq: Once | ORAL | Status: AC
  Administered 2012-08-12: 5 mg via ORAL
  Filled 2012-08-12: qty 1

## 2012-08-12 MED ORDER — CITRIC ACID-SODIUM CITRATE 334-500 MG/5ML PO SOLN: 30.0000 mL | ORAL | Status: DC | PRN

## 2012-08-12 NOTE — MAU Provider Note (Signed)
History     CSN: [id number]  Arrival date and time: 08/12/12 7829   First Provider Initiated Contact with Patient 08/12/12 [phone]      Chief Complaint  Patient presents with  . Labor Eval   HPI 37 y/o G3P1011 here with contractions for a labor check. She says that her contractions started about 1.5 days ago but began to get much stronger about 8 hours ago. They have increased in strength, now 7-8/10 and are irregular about 2-12 minutes apart. She denies vaginal bleeding, LOF, discharge, and decreased fetal movement. She states she has had headaches and scotoma throughout her pregnancy but is not having any tonight. She denies any current dyspnea, chest pain, dysuria, or edema. She receives her care in [facility].   OB History   Grav Para Term Preterm Abortions TAB SAB Ect Mult Living   3 1 1  0 1 1 0 0 0 1      Past Medical History  Diagnosis Date  . Heart murmur   . Infection     Yeast inf;gets freq w/ antibxs or scented soaps  . Infection     BV;recently treated for BV completed Flagyl  . Anemia     iron supplements in the past  . Ovarian cyst in pregnancy 2013    seen on recent US  . Benign breast cyst in female 1995    had bx done was normal  . DUB (dysfunctional uterine bleeding) 1995  . Asthma     has albuterol, advair prn, no attack x 2 years;triggered by strong smells (bleach and smoke)  . Anxiety     has been prescribed Xanax  . Preeclampsia 1994    induced @ 38 weeks  . Headache     during and prior to pregnancy;usually Rt side of head;can effect vision  . 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 Memory loss 2013    Currently having difficult time remembering things  . Scoliosis     Past Surgical History  Procedure Laterality Date  . No past surgeries    . Wisdom tooth extraction  05/2011    all 4 removed    Family History  Problem Relation Age of Onset  . Cancer Mother     Throat  . Cancer Father     Throat  . Hypertension Father    . Hypertension Paternal Grandmother   . Hypertension Paternal Aunt   . Diabetes Maternal Aunt   . Asthma Sister   . Asthma Brother   . Asthma Cousin   . Asthma Paternal Aunt   . Heart failure Paternal Grandmother   . Thyroid disease Father     Hyperthyoid  . Seizures Mother   . Stroke Maternal Uncle   . Stroke Paternal Uncle     x 2  . Ulcers Mother   . Pancreatitis Mother   . Pancreatitis Father   . COPD Father   . Cirrhosis Father     Liver  . Sickle cell trait Other     Nephew    History  Substance Use Topics  . Smoking status: Current Every Day Smoker -- 0.25 packs/day for 10 years    Types: Cigarettes  . Smokeless tobacco: Never Used  . Alcohol Use: No     Comment: Occasionally    Allergies:  Allergies  Allergen Reactions  . Tomato Hives    & throat swelling  . Bee Venom Swelling    Throat  swells    Prescriptions prior to admission  Medication Sig Dispense Refill  . albuterol (PROVENTIL HFA;VENTOLIN HFA) 108 (90 BASE) MCG/ACT inhaler Inhale 2 puffs into the lungs every 4 (four) hours as needed for wheezing.  1 Inhaler  2  . budesonide-formoterol (SYMBICORT) 160-4.5 MCG/ACT inhaler Take 2 puffs first thing in am and then another 2 puffs about 12 hours later.  1 Inhaler  12  . hydrocortisone-pramoxine (PROCTOFOAM HC) rectal foam Place 1 applicator rectally 2 (two) times daily.  10 g  0  . hydrOXYzine (VISTARIL) 50 MG capsule Take 1 capsule (50 mg total) by mouth every 6 (six) hours as needed for itching.  30 capsule  0  . Iron-FA-B Cmp-C-Biot-Probiotic (FUSION PLUS) CAPS Take 1 capsule by mouth 2 (two) times daily.  60 capsule  3  . phenylephrine-shark liver oil-mineral oil-petrolatum (PREPARATION H) 0.25-3-14-71.9 % rectal ointment Place rectally 2 (two) times daily as needed for hemorrhoids.  30 g  0  . potassium chloride (K-DUR) 10 MEQ tablet Take 2 tablets (20 mEq total) by mouth daily.  60 tablet  2  . Prenat-FeFum-FePo-FA-Omega 3 (CONCEPT DHA) 53.5-38-1  MG CAPS Take 1 capsule by mouth daily.  30 capsule  11  . Sertraline HCl (ZOLOFT PO) Take 1 tablet by mouth daily.      Marland Kitchen triamcinolone (KENALOG) 0.025 % ointment Apply 1 application topically 2 (two) times daily as needed.      . valACYclovir (VALTREX) 500 MG tablet Take 1 tablet (500 mg total) by mouth 2 (two) times daily.  60 tablet  1  . cyclobenzaprine (FLEXERIL) 10 MG tablet Take 1 tablet (10 mg total) by mouth every 8 (eight) hours as needed for muscle spasms.  30 tablet  0    ROS Per HPI Physical Exam   Blood pressure 136/77, pulse 93, temperature 98.3 F (36.8 C), temperature source Oral, resp. rate 18, height 5\' 9"  (1.753 m), weight 65.318 kg (144 lb), last menstrual period 11/21/2011.  Physical Exam Gen: NAD, alert, cooperative with exam HEENT: NCAT, EOMI, PERRL CV: RRR, good S1/S2, no murmur Resp: CTABL, no wheezes, non-labored Abd: Soft, pregnant abdomen Ext: No edema, warm Neuro: Alert and oriented, No gross deficits Dilation: Fingertip Effacement (%): 50 Cervical Position: Posterior Station: Ballotable Exam by:: Felipa Emory MD  FHT: Baseline 130, Moderate variability, accels present, Few variable decelerations now resolved Toco: regular 7-10 minutes  MAU Course  Procedures  BPP 8/8  Assessment and Plan  37 y/O G3P1011 here for a labor check - Not in active labor with irregular contractions and cervix at 0.5 cm - Reactive Fetal strip, category 2, BPP 8/8 - F/u in [facility] (Clinic will contact for appt) and with previously scheduled NST in 2 days   Kevin Fenton 08/12/2012, 3:51 AM

## 2012-08-12 NOTE — MAU Note (Signed)
C/o ucs since 0630 yesterday morning; has been once before today for labor check and was 1 cm;

## 2012-08-12 NOTE — H&P (Signed)
Anne Olson is a 37 y.o. female presenting with uterine contractions. Maternal Medical History:  Reason for admission: Contractions.  Nausea.  Contractions: Onset was yesterday.   Frequency: regular.   Duration is approximately 1 minute.   Perceived severity is moderate.    Fetal activity: Perceived fetal activity is normal.   Last perceived fetal movement was within the past hour.    Prenatal complications: No bleeding or HIV.   Prenatal Complications - Diabetes: none.    OB History   Grav Para Term Preterm Abortions TAB SAB Ect Mult Living   3 1 1  0 1 1 0 0 0 1     Past Medical History  Diagnosis Date  . Heart murmur   . Infection     Yeast inf;gets freq w/ antibxs or scented soaps  . Infection     BV;recently treated for BV completed Flagyl  . Anemia     iron supplements in the past  . Ovarian cyst in pregnancy 2013    seen on recent US  . Benign breast cyst in female 1995    had bx done was normal  . DUB (dysfunctional uterine bleeding) 1995  . Asthma     has albuterol, advair prn, no attack x 2 years;triggered by strong smells (bleach and smoke)  . Anxiety     has been prescribed Xanax  . Preeclampsia 1994    induced @ 38 weeks  . Headache     during and prior to pregnancy;usually Rt side of head;can effect vision  . 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 Memory loss 2013    Currently having difficult time remembering things  . Scoliosis    Past Surgical History  Procedure Laterality Date  . No past surgeries    . Wisdom tooth extraction  05/2011    all 4 removed   Family History: family history includes Asthma in her brother, cousin, paternal aunt, and sister; COPD in her father; Cancer in her father and mother; Cirrhosis in her father; Diabetes in her maternal aunt; Heart failure in her paternal grandmother; Hypertension in her father, paternal aunt, and paternal grandmother; Pancreatitis in her father and mother; Seizures in her  mother; Sickle cell trait in her other; Stroke in her maternal uncle and paternal uncle; Thyroid disease in her father; and Ulcers in her mother. Social History:  reports that she has been smoking Cigarettes.  She has a 2.5 pack-year smoking history. She has never used smokeless tobacco. She reports that she does not drink alcohol or use illicit drugs.   Prenatal Transfer Tool  Maternal Diabetes: No Genetic Screening: Normal Maternal Ultrasounds/Referrals: Normal Fetal Ultrasounds or other Referrals:  None Maternal Substance Abuse:  No Significant Maternal Medications:  None Significant Maternal Lab Results:  Lab values include: Other: Hx of elevated Varicella  Other Comments:  None  Review of Systems  Constitutional: Negative for fever and chills.  HENT: Negative for hearing loss.   Eyes: Negative for blurred vision and double vision.  Respiratory: Negative for cough and hemoptysis.   Cardiovascular: Negative for chest pain and palpitations.  Gastrointestinal: Negative for nausea, vomiting, diarrhea and constipation.  Genitourinary: Negative for dysuria and urgency.  Musculoskeletal: Positive for back pain. Negative for myalgias and joint pain.  Neurological: Negative for dizziness, tingling and headaches.  All other systems reviewed and are negative.      Blood pressure 162/89, pulse 107, temperature 97.5 F (36.4 C), temperature source  Oral, resp. rate 16, last menstrual period 11/21/2011. Maternal Exam:  Uterine Assessment: Contraction strength is firm.  Contraction duration is 1 minute. Contraction frequency is regular.   Abdomen: not evaluated.  Introitus: Vulva is positive for vulval piercings. Normal vagina.  Ferning test: not done.  Nitrazine test: not done. Amniotic fluid character: not assessed.  Pelvis: adequate for delivery.   Cervix: Cervix evaluated by digital exam.     Physical Exam  Vitals reviewed. Constitutional: She is oriented to person, place, and  time. She appears well-developed and well-nourished.  HENT:  Head: Normocephalic and atraumatic.  Cardiovascular: Normal rate, regular rhythm, normal heart sounds and intact distal pulses.   Genitourinary: Vagina normal and uterus normal.  Musculoskeletal: Normal range of motion.  Neurological: She is alert and oriented to person, place, and time. She has normal reflexes.  Skin: Skin is warm and dry.    Prenatal labs: ABO, Rh: O/POS/-- (08/07 1553) Antibody: NEG (08/07 1553) Rubella: 137.1 (08/07 1553) RPR: NON REAC (12/19 1206)  HBsAg: NEGATIVE (01/06 0030)  HIV: NON REACTIVE (08/07 1553)  GBS:     Assessment/Plan: 38 y/o G3P1011 at [redacted]w[redacted]d presenting with regular uterine contractions  # Contractions -- admit to L&D for anticipated NSVD -- nursing per floor protocol  DISPO:  Diet: Full Activity: up ab lib Electrolytes: as needed Prophylaxis: GI: ppi as needed, DVT - early ambulation Code: Full  Anticipate admit for > 2 midnights for SVD.    Gregor Hams 08/12/2012, 8:00 PM  Seen also by me Agree with note

## 2012-08-12 NOTE — MAU Note (Signed)
Contracting since yesterday morning. Contractions are now 2-12 mins apart. No vaginal bleeding or leaking of fluid. Good fetal movement

## 2012-08-13 ENCOUNTER — Encounter: Payer: Self-pay | Admitting: Obstetrics and Gynecology

## 2012-08-13 LAB — CBC
HCT: 28.3 % — ABNORMAL LOW (ref 36.0–46.0)
MCH: 30.8 pg (ref 26.0–34.0)
MCHC: 34.3 g/dL (ref 30.0–36.0)
MCV: 89.8 fL (ref 78.0–100.0)
Platelets: 263 10*3/uL (ref 150–400)
RDW: 14.1 % (ref 11.5–15.5)
WBC: 27.6 10*3/uL — ABNORMAL HIGH (ref 4.0–10.5)

## 2012-08-13 LAB — RPR: RPR Ser Ql: NONREACTIVE

## 2012-08-13 MED ORDER — HYDROCORTISONE ACE-PRAMOXINE 1-1 % RE FOAM
1.0000 | Freq: Two times a day (BID) | RECTAL | Status: DC
  Administered 2012-08-13 – 2012-08-14 (×2): 1 via RECTAL
  Filled 2012-08-13: qty 10

## 2012-08-13 MED ORDER — SERTRALINE HCL 25 MG PO TABS
25.0000 mg | ORAL_TABLET | Freq: Every day | ORAL | Status: DC
  Administered 2012-08-13: 25 mg via ORAL
  Filled 2012-08-13 (×2): qty 1

## 2012-08-13 MED ORDER — PNEUMOCOCCAL VAC POLYVALENT 25 MCG/0.5ML IJ INJ
0.5000 mL | INJECTION | INTRAMUSCULAR | Status: AC
  Administered 2012-08-14: 0.5 mL via INTRAMUSCULAR
  Filled 2012-08-13: qty 0.5

## 2012-08-13 NOTE — MAU Provider Note (Signed)
I have seen and examined patient and reviewed prenatal record, labs and imaging. [facility] in [facility] for [facility] asthma and history of preeclampsia. She has had some elevated blood pressures this pregnancy in the 140s/80s. 24 hour urine protein on 2/10 was 120 and urine protein/creatinine ratio on 2/27 was 0.18. She has biweekly NSTs which have been reactive, next one is on Thursday 3/6. A follow-up ultrasound for growth on 3/3 showed normal AFI and EFW in the 56%.  I reviewed NST, originally non-reactive with occasional variable to 90s. Patient sent for BPP, which was 8/8 and remainder of strip reactive. Agree with resident note. Patient discharged home with labor precautions. Napoleon Form, MD

## 2012-08-13 NOTE — Progress Notes (Signed)
I have seen and examined this patient and I agree with the above. Cam Hai 8:07 AM 08/13/2012

## 2012-08-13 NOTE — Progress Notes (Signed)

## 2012-08-13 NOTE — Progress Notes (Signed)
Ur chart review completed.  

## 2012-08-13 NOTE — Progress Notes (Signed)
Post Partum Day 1  Subjective: Pt mentions 8/10 pelvic discomfort that improves with oral medications, pt has been, up ad lib, voiding, tolerating PO and + flatus.  Pt is breast feeding and would like nexplanon for bc.   Objective: Blood pressure 115/66, pulse 85, temperature 98.3 F (36.8 C), temperature source Oral, resp. rate 18, last menstrual period 11/21/2011, SpO2 100.00%, unknown if currently breastfeeding.  Physical Exam:  General: alert and cooperative Lochia: appropriate Uterine Fundus: firm Incision: n/a  DVT Evaluation: No evidence of DVT seen on physical exam. Negative Homan's sign. No significant calf/ankle edema.   Recent Labs  08/12/12 1957 08/13/12 0520  HGB 12.0 9.7*  HCT 34.9* 28.3*    Assessment/Plan: Plan for discharge tomorrow, Breastfeeding, Lactation consult and Contraception Nexplanon; pt has several rods still in place L arm;  # Breast feeding: --  Pt request lactation consult  # Anemia:  -- iron supplementation.    # Contraception:  -- pt request Nexplanon -- plan for PPD 21-28   LOS: 1 day   Gregor Hams 08/13/2012, 7:31 AM

## 2012-08-14 ENCOUNTER — Other Ambulatory Visit: Payer: Medicaid Other

## 2012-08-14 MED ORDER — OXYCODONE-ACETAMINOPHEN 5-325 MG PO TABS: 1.0000 | ORAL_TABLET | ORAL | Status: DC | PRN

## 2012-08-14 MED ORDER — IBUPROFEN 600 MG PO TABS: 600.0000 mg | ORAL_TABLET | Freq: Four times a day (QID) | ORAL | Status: DC

## 2012-08-14 NOTE — Discharge Summary (Signed)
Obstetric Discharge Summary Anne Olson is a 37 y.o. [id number] presenting at [redacted]w[redacted]d in active labor. She progressed to complete dilation and SVD without epidural. Postpartum course has been unremarkable. She is breastfeeding and plans nexplanon for birth control.  Reason for Admission: onset of labor Prenatal Procedures: NST Intrapartum Procedures: spontaneous vaginal delivery Postpartum Procedures: none Complications-Operative and Postpartum: 1st  degree perineal laceration, 1st degree labial tears at 11 and 2 o'clock Hemoglobin  Date Value Range Status  08/13/2012 9.7* 12.0 - 15.0 g/dL Final     REPEATED TO VERIFY     DELTA CHECK NOTED     HCT  Date Value Range Status  08/13/2012 28.3* 36.0 - 46.0 % Final    Physical Exam:  General: alert, cooperative and appears stated age Lochia: appropriate Uterine Fundus: firm DVT Evaluation: No evidence of DVT seen on physical exam. Negative Homan's sign. No significant calf/ankle edema.  Discharge Diagnoses: Term Pregnancy-delivered  Discharge Information: Date: 08/14/2012 Activity: unrestricted Diet: routine Medications: Resume home medications, oral pain medications as needed Condition: stable Instructions: refer to practice specific booklet Discharge to: home, f/u for depo injection in 3-5 days post d/c, nexplanon insertion in 4 weeks and postpartum visit in 6 weeks  Follow-up Information   Follow up with [facility] In 6 weeks. (Clinic will call for depo injection,  nexplanon insertion, if no call in 1 week, call and schedule apt. )    Contact information:   [address] [place] [address] [phone]      Newborn Data: Live born female  Birth Weight: 5 lb 15.6 oz (2710 g) APGAR: 8, 9  Home with mother.  Gregor Hams 08/14/2012, 7:09 AM  I saw and examined patient and agree with student note. I have reviewed history, labs, delivery summary. Discharge on ferrous sulfate for anemia.  Napoleon Form, MD

## 2012-08-17 NOTE — Discharge Summary (Signed)
Attestation of Attending Supervision of Advanced Practitioner: Evaluation and management procedures were performed by the PA/NP/CNM/OB Fellow under my supervision/collaboration. Chart reviewed and agree with management and plan.  FERGUSON,JOHN V 08/17/2012 8:44 PM

## 2012-08-18 ENCOUNTER — Ambulatory Visit (HOSPITAL_COMMUNITY)
Admit: 2012-08-18 | Discharge: 2012-08-18 | Disposition: A | Discharge status: Home / Self Care | Payer: Medicaid Other | Attending: Obstetrics & Gynecology | Admitting: Obstetrics & Gynecology

## 2012-08-18 ENCOUNTER — Other Ambulatory Visit: Payer: Medicaid Other

## 2012-08-18 NOTE — Lactation Note (Signed)
Adult Lactation Consultation Outpatient Visit Note  Patient Name: Anne Olson(Mother)  BABY: [name] Date of Birth: [birth year]                             DOB: [birth year] Gestational Age at Delivery: [redacted]w[redacted]d            BIRTH WEIGHT: 5-15 Type of Delivery: NVD                                 DISCHARGE WEIGHT: 5-7                                                                      WEIGHT TODAY: 5-10.8 Breastfeeding History: Frequency of Breastfeeding: EVERY 2 HOURS Length of Feeding: 15-30 MINUTES Voids: QS Stools: 3+ GREEN  Supplementing / Method:USED SNS WITH 20-60 MLS OF FORMULA ON 3/6 AND 3/7 BUT NONE SINCE THEN. Pumping:  Type of Pump:NONE   Frequency:  Volume:    Comments:    Consultation Evaluation:Mom and 6 day old baby here for feeing assessment.  Mom started using SNS in hospital because baby was never acting content after feedings.  Milk came in day after discharge and she stopped supplementation.  Mom states baby has been latching well and nursing well.  Observed baby easily latching to right breast in cross cradle hold.  Baby nursed well with stimulation and mom using breast massage.  Mom's breast full prior to feeding with softening after feeding noted.  Mom denies questions at this time and continues to feed on demand.  Initial Feeding Assessment:15 MINUTES RIGHT BREAST Pre-feed [id number] Post-feed OZHYQM:5784 Amount Transferred:22 MLS Comments:BABY JUST ATE 30 MINUTES AGO AT [facility]  Additional Feeding Assessment: Pre-feed Weight: Post-feed Weight: Amount Transferred: Comments:  Additional Feeding Assessment: Pre-feed Weight: Post-feed Weight: Amount Transferred: Comments:  Total Breast milk Transferred this Visit: 22 MLS Total Supplement Given: NONE  Additional Interventions:   Follow-Up WILL CALL PRN      Hansel Feinstein 08/18/2012, 1:26 PM

## 2012-09-02 NOTE — Progress Notes (Signed)
NST 08-04-12 reviewed and reactive 

## 2012-09-08 ENCOUNTER — Ambulatory Visit: Payer: Medicaid Other | Admitting: Obstetrics and Gynecology

## 2012-09-24 ENCOUNTER — Encounter: Payer: Self-pay | Admitting: *Deleted

## 2012-09-24 ENCOUNTER — Ambulatory Visit (INDEPENDENT_AMBULATORY_CARE_PROVIDER_SITE_OTHER): Payer: Medicaid Other | Admitting: Family Medicine

## 2012-09-24 ENCOUNTER — Other Ambulatory Visit (HOSPITAL_COMMUNITY)
Admission: RE | Admit: 2012-09-24 | Discharge: 2012-09-24 | Disposition: A | Discharge status: Home / Self Care | Payer: Medicaid Other | Source: Ambulatory Visit | Attending: Family Medicine | Admitting: Family Medicine

## 2012-09-24 ENCOUNTER — Encounter: Payer: Self-pay | Admitting: Family Medicine

## 2012-09-24 DIAGNOSIS — Z01419 Encounter for gynecological examination (general) (routine) without abnormal findings: Secondary | ICD-10-CM | POA: Insufficient documentation

## 2012-09-24 DIAGNOSIS — F329 Major depressive disorder, single episode, unspecified: Secondary | ICD-10-CM

## 2012-09-24 DIAGNOSIS — F411 Generalized anxiety disorder: Secondary | ICD-10-CM

## 2012-09-24 DIAGNOSIS — Z3049 Encounter for surveillance of other contraceptives: Secondary | ICD-10-CM

## 2012-09-24 DIAGNOSIS — Z1151 Encounter for screening for human papillomavirus (HPV): Secondary | ICD-10-CM | POA: Insufficient documentation

## 2012-09-24 DIAGNOSIS — F419 Anxiety disorder, unspecified: Secondary | ICD-10-CM

## 2012-09-24 DIAGNOSIS — O99345 Other mental disorders complicating the puerperium: Secondary | ICD-10-CM

## 2012-09-24 DIAGNOSIS — F53 Postpartum depression: Secondary | ICD-10-CM

## 2012-09-24 DIAGNOSIS — Z113 Encounter for screening for infections with a predominantly sexual mode of transmission: Secondary | ICD-10-CM | POA: Insufficient documentation

## 2012-09-24 DIAGNOSIS — R030 Elevated blood-pressure reading, without diagnosis of hypertension: Secondary | ICD-10-CM

## 2012-09-24 MED ORDER — SERTRALINE HCL 50 MG PO TABS: 50.0000 mg | ORAL_TABLET | Freq: Every day | ORAL | Status: DC

## 2012-09-24 MED ORDER — MEDROXYPROGESTERONE ACETATE 150 MG/ML IM SUSP
150.0000 mg | Freq: Once | INTRAMUSCULAR | Status: AC
  Administered 2012-09-24: 150 mg via INTRAMUSCULAR

## 2012-09-24 MED ORDER — MEDROXYPROGESTERONE ACETATE 150 MG/ML IM SUSP: 150.0000 mg | INTRAMUSCULAR | Status: DC

## 2012-09-24 NOTE — Progress Notes (Signed)
Patient ID: Anne Olson, female   DOB: 05-31-1976, 37 y.o.   MRN: [id number]   Subjective:     Anne Olson is a 37 y.o. female who presents for a postpartum visit. She is 6 weeks postpartum following a spontaneous vaginal delivery. I have fully reviewed the prenatal and intrapartum course. The delivery was at [redacted]w[redacted]d gestation. Outcome: spontaneous vaginal delivery. Anesthesia: none. Postpartum course has been normal. Baby's course has been normal. Baby is feeding by both breast and bottle - Carnation Good Start. Bleeding consistent bleeding - 4 to 5 pads a day. Clots, small, better.  Bowel function is normal. Bladder function is normal. Patient is not sexually active. Contraception method is Depo-Provera injections and Nexplanon. Postpartum depression screening: positive.  Having pains in abdomen, bilateral, LLQ and RLQ, sharp, intermittent, esp with movement. No nausea/vomiting/diarrhea/constipation. No fever/chills.  The following portions of the patient's history were reviewed and updated as appropriate: allergies, current medications, past family history, past medical history, past social history, past surgical history and problem list.  Review of Systems Pertinent items are noted in HPI.   Objective:    BP 168/109  Pulse 83  Temp(Src) 98.2 F (36.8 C) (Oral)  Ht 5' 8.5" (1.74 m)  Wt 123 lb 12.8 oz (56.155 kg)  BMI 18.55 kg/m2  Breastfeeding? Yes    General:  alert, cooperative, no distress and very thin   Breasts:  negative  Lungs: clear to auscultation bilaterally  Heart:  regular rate and rhythm, S1, S2 normal, no murmur, click, rub or gallop  Abdomen: soft, non-tender; bowel sounds normal; no masses,  no organomegaly   Vulva:  normal  Vagina: well healed, blood in vaginal vault, no discharge  Cervix:  multiparous appearance, no cervical motion tenderness and bleeding present before pap  Corpus: normal size, contour, position, consistency, mobility, non-tender  Adnexa:   no masses or fulness. no tenderness  Rectal Exam: Not performed.        Assessment:     Postpartum exam. Pap smear done at today's visit.  1.  Continued excessive bleeding postpartum 2.  Pelvic pain 3.  Depression/anxiety 4.  Undesired fertility 5.  Asthma  Plan:    1. Contraception: Depo-Provera injections and Nexplanon. Will get depo shot today and schedule appt for Nexplanon insertion 2. Bleeding/pain - ultrasound, may resolve with Depo 3.  Depression/anxiety - increase zoloft to 50 mg daily. Referral to psychiatry 3. Follow up in: 1 week for Nexplanon or as needed.

## 2012-09-24 NOTE — Patient Instructions (Addendum)
Etonogestrel implant What is this medicine? ETONOGESTREL is a contraceptive (birth control) device. It is used to prevent pregnancy. It can be used for up to 3 years. This medicine may be used for other purposes; ask your health care provider or pharmacist if you have questions. What should I tell my health care provider before I take this medicine? They need to know if you have any of these conditions: -abnormal vaginal bleeding -blood vessel disease or blood clots -cancer of the breast, cervix, or liver -depression -diabetes -gallbladder disease -headaches -heart disease or recent heart attack -high blood pressure -high cholesterol -kidney disease -liver disease -renal disease -seizures -tobacco smoker -an unusual or allergic reaction to etonogestrel, other hormones, anesthetics or antiseptics, medicines, foods, dyes, or preservatives -pregnant or trying to get pregnant -breast-feeding How should I use this medicine? This device is inserted just under the skin on the inner side of your upper arm by a health care professional. Talk to your pediatrician regarding the use of this medicine in children. Special care may be needed. Overdosage: If you think you've taken too much of this medicine contact a poison control center or emergency room at once. Overdosage: If you think you have taken too much of this medicine contact a poison control center or emergency room at once. NOTE: This medicine is only for you. Do not share this medicine with others. What if I miss a dose? This does not apply. What may interact with this medicine? Do not take this medicine with any of the following medications: -amprenavir -bosentan -fosamprenavir This medicine may also interact with the following medications: -barbiturate medicines for inducing sleep or treating seizures -certain medicines for fungal infections like ketoconazole and itraconazole -griseofulvin -medicines to treat seizures  like carbamazepine, felbamate, oxcarbazepine, phenytoin, topiramate -modafinil -phenylbutazone -rifampin -some medicines to treat HIV infection like atazanavir, indinavir, lopinavir, nelfinavir, tipranavir, ritonavir -St. John's wort This list may not describe all possible interactions. Give your health care provider a list of all the medicines, herbs, non-prescription drugs, or dietary supplements you use. Also tell them if you smoke, drink alcohol, or use illegal drugs. Some items may interact with your medicine. What should I watch for while using this medicine? This product does not protect you against HIV infection (AIDS) or other sexually transmitted diseases. You should be able to feel the implant by pressing your fingertips over the skin where it was inserted. Tell your doctor if you cannot feel the implant. What side effects may I notice from receiving this medicine? Side effects that you should report to your doctor or health care professional as soon as possible: -allergic reactions like skin rash, itching or hives, swelling of the face, lips, or tongue -breast lumps -changes in vision -confusion, trouble speaking or understanding -dark urine -depressed mood -general ill feeling or flu-like symptoms -light-colored stools -loss of appetite, nausea -right upper belly pain -severe headaches -severe pain, swelling, or tenderness in the abdomen -shortness of breath, chest pain, swelling in a leg -signs of pregnancy -sudden numbness or weakness of the face, arm or leg -trouble walking, dizziness, loss of balance or coordination -unusual vaginal bleeding, discharge -unusually weak or tired -yellowing of the eyes or skin Side effects that usually do not require medical attention (Report these to your doctor or health care professional if they continue or are bothersome.): -acne -breast pain -changes in weight -cough -fever or chills -headache -irregular menstrual  bleeding -itching, burning, and vaginal discharge -pain or difficulty passing urine -sore throat  This list may not describe all possible side effects. Call your doctor for medical advice about side effects. You may report side effects to FDA at 1-800-FDA-1088. Where should I keep my medicine? This drug is given in a hospital or clinic and will not be stored at home. NOTE: This sheet is a summary. It may not cover all possible information. If you have questions about this medicine, talk to your doctor, pharmacist, or health care provider.  2013, [employer]. (02/18/2009 3:54:17 PM)   Postpartum Depression and Baby Blues The postpartum period begins right after the birth of a baby. During this time, there is often a great amount of joy and excitement. It is also a time of considerable changes in the life of the parent(s). Regardless of how many times a mother gives birth, each child brings new challenges and dynamics to the family. It is not unusual to have feelings of excitement accompanied by confusing shifts in moods, emotions, and thoughts. All mothers are at risk of developing postpartum depression or the "baby blues." These mood changes can occur right after giving birth, or they may occur many months after giving birth. The baby blues or postpartum depression can be mild or severe. Additionally, postpartum depression can resolve rather quickly, or it can be a long-term condition. CAUSES Elevated hormones and their rapid decline are thought to be a main cause of postpartum depression and the baby blues. There are a number of hormones that radically change during and after pregnancy. Estrogen and progesterone usually decrease immediately after delivering your baby. The level of thyroid hormone and various cortisol steroids also rapidly drop. Other factors that play a major role in these changes include major life events and genetics.  RISK FACTORS If you have any of the following risks  for the baby blues or postpartum depression, know what symptoms to watch out for during the postpartum period. Risk factors that may increase the likelihood of getting the baby blues or postpartum depression include:  Havinga personal or family history of depression.  Having depression while being pregnant.  Having premenstrual or oral contraceptive-associated mood issues.  Having exceptional life stress.  Having marital conflict.  Lacking a social support network.  Having a baby with special needs.  Having health problems such as diabetes. SYMPTOMS Baby blues symptoms include:  Brief fluctuations in mood, such as going from extreme happiness to sadness.  Decreased concentration.  Difficulty sleeping.  Crying spells, tearfulness.  Irritability.  Anxiety. Postpartum depression symptoms typically begin within the first month after giving birth. These symptoms include:  Difficulty sleeping or excessive sleepiness.  Marked weight loss.  Agitation.  Feelings of worthlessness.  Lack of interest in activity or food. Postpartum psychosis is a very concerning condition and can be dangerous. Fortunately, it is rare. Displaying any of the following symptoms is cause for immediate medical attention. Postpartum psychosis symptoms include:  Hallucinations and delusions.  Bizarre or disorganized behavior.  Confusion or disorientation. DIAGNOSIS  A diagnosis is made by an evaluation of your symptoms. There are no medical or lab tests that lead to a diagnosis, but there are various questionnaires that a caregiver may use to identify those with the baby blues, postpartum depression, or psychosis. Often times, a screening tool called the [place] Postnatal Depression Scale is used to diagnose depression in the postpartum period.  TREATMENT The baby blues usually goes away on its own in 1 to 2 weeks. Social support is often all that is needed. You should be encouraged  to get  adequate sleep and rest. Occasionally, you may be given medicines to help you sleep.  Postpartum depression requires treatment as it can last several months or longer if it is not treated. Treatment may include individual or group therapy, medicine, or both to address any social, physiological, and psychological factors that may play a role in the depression. Regular exercise, a healthy diet, rest, and social support may also be strongly recommended.  Postpartum psychosis is more serious and needs treatment right away. Hospitalization is often needed. HOME CARE INSTRUCTIONS  Get as much rest as you can. Nap when the baby sleeps.  Exercise regularly. Some women find yoga and walking to be beneficial.  Eat a balanced and nourishing diet.  Do little things that you enjoy. Have a cup of tea, take a bubble bath, read your favorite magazine, or listen to your favorite music.  Avoid alcohol.  Ask for help with household chores, cooking, grocery shopping, or running errands as needed. Do not try to do everything.  Talk to people close to you about how you are feeling. Get support from your partner, family members, friends, or other new moms.  Try to stay positive in how you think. Think about the things you are grateful for.  Do not spend a lot of time alone.  Only take medicine as directed by your caregiver.  Keep all your postpartum appointments.  Let your caregiver know if you have any concerns. SEEK MEDICAL CARE IF: You are having a reaction or problems with your medicine. SEEK IMMEDIATE MEDICAL CARE IF:  You have suicidal feelings.  You feel you may harm the baby or someone else. Document Released: 03/01/2004 Document Revised: 08/20/2011 Document Reviewed: 04/03/2011 [facility] Patient Information 2013 [place], [place].

## 2012-09-24 NOTE — Progress Notes (Signed)
Patient ID: [facility], female   DOB: 28-May-1976, 37 y.o.   MRN: [id number]   Pt. C/o some depression, crying spells, trouble sleeping.  Also c/o a rash on face and neck.  Has not changed soaps or detergents. Has lots of anxiety.  Has high blood pressures.  4:05pm= 140/102.  Has pains in lower abdomen and back, feels like she is still having contractions.  They are cramps stronger than menstrual cramps.  States "they" had trouble getting the placenta out.  Has headaches almost every morning.

## 2012-09-25 ENCOUNTER — Encounter: Payer: Self-pay | Admitting: Internal Medicine

## 2012-09-25 ENCOUNTER — Ambulatory Visit (INDEPENDENT_AMBULATORY_CARE_PROVIDER_SITE_OTHER): Payer: Medicaid Other | Admitting: Internal Medicine

## 2012-09-25 VITALS — BP 138/80 | HR 90 | Temp 97.8°F | Ht 68.5 in | Wt 127.0 lb

## 2012-09-25 DIAGNOSIS — F17200 Nicotine dependence, unspecified, uncomplicated: Secondary | ICD-10-CM

## 2012-09-25 DIAGNOSIS — J45909 Unspecified asthma, uncomplicated: Secondary | ICD-10-CM

## 2012-09-25 NOTE — Progress Notes (Signed)
Subjective:    Patient ID: [facility], female    DOB: 05/20/1976  MRN: [id number]   HPI 73 yobf smoker dx of asthma as teenager before started smoking, then 1994 pregancy / asthma then admitted to [facility] (only time admitted) w/in one year post partum x 2 weeks with severe asthma, never since, referred by [facility] for evaluation of sob during pregnancy.   05/16/2012 1st pulmonary eval still  smoking @ [redacted] weeks gestation cc sob x across the room onset was w/in first 10 weeks of learning she was pregnant, pattern different as  no better p albuterol,  Her asthma symptoms are typically  Some better with advair, lots better p nebulizer but doesn't have one - last ER 6 months.  Typical triggers for asthma spells are strong fumes > tightness, better with proventil, needs to use daily when goes work. rec The key is to stop smoking completely - it's the most important aspect of your care symbicort 160 Take 2 puffs first thing in am and then another 2 puffs about 12 hours later.  Work on inhaler technique:  relax and gently Please schedule a follow up office visit in 2 weeks, sooner if needed    05/30/2012 f/u [facility]  Still smoking cc no change doe or need for albuterol, once gets comfortable at night does ok propped up  rec Symbicort 160 2 bid Prn albuterol   06/27/2012 f/u ov/[facility] cc only 4-5 x use of ventolin since last ov, no change doe and chest tightness rec No change in medications but bring your respiratory medications with you on return Only use your albuterol (blue = ventolin) as rescue   07/31/2012 f/u ov/[facility]  Still smoking cc breathing is about the same, cough at hs > clear mucus rec Plan A  = Automatic Symbicort 160 2 twice daily Only use your albuterol (blue = ventolin/ yellow proventil)   08/12/2012 uneventful vag delivery   09/25/2012 f/u ov/[facility]  Chief Complaint  Patient presents with  . Follow-up    Breathing has improved some since last visit. No new co's today.    main problem at hs feels sob immediately uncomfortable if < 30 degrees but at > 30 degrees does fine and not using saba at all daytime   No obvious daytime variabilty or assoc cp or chest tightness, subjective wheeze overt sinus or hb symptoms. No unusual exp hx or h/o childhood pna/ asthma or premature birth to her knowledge.   Sleeping ok without nocturnal  or early am exacerbation  of respiratory  c/o's or need for noct saba. Also denies any obvious fluctuation of symptoms with weather or environmental changes or other aggravating or alleviating factors except as outlined above   Current Medications, Allergies, Past Medical History, Past Surgical History, Family History, and Social History were reviewed in [employer] record.  ROS  The following are not active complaints unless bolded sore throat, dysphagia, dental problems, itching, sneezing,  nasal congestion or excess/ purulent secretions, ear ache,   fever, chills, sweats, unintended wt loss, pleuritic or exertional cp, hemoptysis,  orthopnea pnd or leg swelling, presyncope, palpitations, heartburn, abdominal pain, anorexia, nausea, vomiting, diarrhea  or change in bowel or urinary habits, change in stools or urine, dysuria,hematuria,  rash, arthralgias, visual complaints, headache, numbness weakness or ataxia or problems with walking or coordination,  change in mood/affect or memory.          Objective:   Physical Exam  Less anxious chronically ill  appearing thin bf nad   05/30/2012  137 > 144 06/27/2012 > 09/25/2012 127  Wt Readings from Last 3 Encounters:  05/16/12 136 lb (61.689 kg)  05/12/12 132 lb (59.875 kg)  04/30/12 130 lb (58.968 kg)     HEENT: nl dentition, turbinates, and orophanx. Nl external ear canals without cough reflex   NECK :  without JVD/Nodes/TM/ nl carotid upstrokes bilaterally   LUNGS: no acc muscle use, clear to A and P bilaterally without cough on insp or exp maneuvers   CV:   RRR  no s3 or murmur or increase in P2,  2 plus edema   ABD:  soft and nontender with nl excursion in the supine position. No bruits or organomegaly, bowel sounds nl.  MS:  warm without deformities, calf tenderness, cyanosis or clubbing  SKIN: warm and dry without lesions         cxr 03/06/12 No pneumonia or acute cardiopulmonary disease. Subsegmental  atelectasis in the lingula       Assessment & Plan:

## 2012-09-25 NOTE — Patient Instructions (Addendum)
Continue symbicort 160 Take 2 puffs first thing in am and then another 2 puffs about 12 hours later.   The key is to stop smoking completely before smoking completely stops you!    Please schedule a follow up office visit in 6 weeks, call sooner if needed with pft's and cxr

## 2012-09-26 ENCOUNTER — Ambulatory Visit (HOSPITAL_COMMUNITY)
Admission: RE | Admit: 2012-09-26 | Discharge: 2012-09-26 | Disposition: A | Discharge status: Home / Self Care | Payer: Medicaid Other | Source: Ambulatory Visit | Attending: Family Medicine | Admitting: Family Medicine

## 2012-09-27 ENCOUNTER — Encounter (HOSPITAL_COMMUNITY): Payer: Self-pay | Admitting: Pharmacy Technician

## 2012-09-27 NOTE — Assessment & Plan Note (Addendum)
>   3 min I reviewed the Flethcher curve with patient that basically indicates  if you quit smoking when your best day FEV1 is still well preserved it is highly unlikely you will progress to severe disease and informed the patient there was no medication on the market that has proven to change the curve or the likelihood of progression.  Therefore stopping smoking and maintaining abstinence is the most important aspect of care, not choice of inhalers or for that matter, doctors.    Needs f/u ov with pft's and work harder on committing to quit

## 2012-09-27 NOTE — Assessment & Plan Note (Signed)
-   hfa 90% 05/30/2012   Much better on maint symbicort 160 s/p vag delivery despite still actively smokng > discussed separately    Each maintenance medication was reviewed in detail including most importantly the difference between maintenance and as needed and under what circumstances the prns are to be used.  Please see instructions for details which were reviewed in writing and the patient given a copy.

## 2012-09-29 ENCOUNTER — Encounter (HOSPITAL_COMMUNITY)
Admission: RE | Disposition: A | Discharge status: Home / Self Care | Payer: Self-pay | Source: Ambulatory Visit | Attending: Obstetrics and Gynecology

## 2012-09-29 ENCOUNTER — Ambulatory Visit (HOSPITAL_COMMUNITY)
Admission: RE | Admit: 2012-09-29 | Discharge: 2012-09-29 | Disposition: A | Discharge status: Home / Self Care | Payer: Medicaid Other | Source: Ambulatory Visit | Attending: Obstetrics and Gynecology | Admitting: Obstetrics and Gynecology

## 2012-09-29 ENCOUNTER — Encounter (HOSPITAL_COMMUNITY): Payer: Self-pay | Admitting: Anesthesiology

## 2012-09-29 ENCOUNTER — Encounter: Payer: Self-pay | Admitting: Obstetrics and Gynecology

## 2012-09-29 ENCOUNTER — Ambulatory Visit (HOSPITAL_COMMUNITY): Payer: Medicaid Other | Admitting: Anesthesiology

## 2012-09-29 HISTORY — PX: DILATION AND EVACUATION: SHX1459

## 2012-09-29 LAB — CBC
Hemoglobin: 12.8 g/dL (ref 12.0–15.0)
MCH: 30.1 pg (ref 26.0–34.0)
MCHC: 33.5 g/dL (ref 30.0–36.0)
RDW: 13.4 % (ref 11.5–15.5)

## 2012-09-29 SURGERY — DILATION AND EVACUATION, UTERUS
Anesthesia: Monitor Anesthesia Care | Site: Vagina | Wound class: Clean Contaminated

## 2012-09-29 MED ORDER — DEXAMETHASONE SODIUM PHOSPHATE 10 MG/ML IJ SOLN
INTRAMUSCULAR | Status: AC
  Filled 2012-09-29: qty 1

## 2012-09-29 MED ORDER — CHLOROPROCAINE HCL 1 % IJ SOLN
INTRAMUSCULAR | Status: AC
  Filled 2012-09-29: qty 30

## 2012-09-29 MED ORDER — MIDAZOLAM HCL 5 MG/5ML IJ SOLN
INTRAMUSCULAR | Status: DC | PRN
  Administered 2012-09-29 (×2): 1 mg via INTRAVENOUS

## 2012-09-29 MED ORDER — LIDOCAINE HCL (CARDIAC) 20 MG/ML IV SOLN
INTRAVENOUS | Status: DC | PRN
  Administered 2012-09-29: 60 mg via INTRAVENOUS

## 2012-09-29 MED ORDER — ALBUTEROL SULFATE (5 MG/ML) 0.5% IN NEBU
INHALATION_SOLUTION | RESPIRATORY_TRACT | Status: AC
  Filled 2012-09-29: qty 0.5

## 2012-09-29 MED ORDER — DEXAMETHASONE SODIUM PHOSPHATE 10 MG/ML IJ SOLN
INTRAMUSCULAR | Status: DC | PRN
  Administered 2012-09-29: 10 mg via INTRAVENOUS

## 2012-09-29 MED ORDER — MIDAZOLAM HCL 2 MG/2ML IJ SOLN
INTRAMUSCULAR | Status: AC
  Filled 2012-09-29: qty 2

## 2012-09-29 MED ORDER — PROPOFOL 10 MG/ML IV EMUL
INTRAVENOUS | Status: AC
  Filled 2012-09-29: qty 40

## 2012-09-29 MED ORDER — PROPOFOL INFUSION 10 MG/ML OPTIME
INTRAVENOUS | Status: DC | PRN
  Administered 2012-09-29: 100 ug/kg/min via INTRAVENOUS

## 2012-09-29 MED ORDER — IBUPROFEN 600 MG PO TABS: 600.0000 mg | ORAL_TABLET | Freq: Four times a day (QID) | ORAL | Status: DC | PRN

## 2012-09-29 MED ORDER — ONDANSETRON HCL 4 MG/2ML IJ SOLN
INTRAMUSCULAR | Status: AC
  Filled 2012-09-29: qty 2

## 2012-09-29 MED ORDER — FENTANYL CITRATE 0.05 MG/ML IJ SOLN
INTRAMUSCULAR | Status: DC | PRN
  Administered 2012-09-29 (×3): 50 ug via INTRAVENOUS

## 2012-09-29 MED ORDER — LIDOCAINE HCL (CARDIAC) 20 MG/ML IV SOLN
INTRAVENOUS | Status: AC
  Filled 2012-09-29: qty 5

## 2012-09-29 MED ORDER — FENTANYL CITRATE 0.05 MG/ML IJ SOLN
INTRAMUSCULAR | Status: AC
  Filled 2012-09-29: qty 5

## 2012-09-29 MED ORDER — ACETAMINOPHEN 160 MG/5ML PO SOLN: 975.0000 mg | Freq: Once | ORAL | Status: DC

## 2012-09-29 MED ORDER — FENTANYL CITRATE 0.05 MG/ML IJ SOLN: 25.0000 ug | INTRAMUSCULAR | Status: DC | PRN

## 2012-09-29 MED ORDER — ONDANSETRON HCL 4 MG/2ML IJ SOLN
INTRAMUSCULAR | Status: DC | PRN
  Administered 2012-09-29: 4 mg via INTRAVENOUS

## 2012-09-29 MED ORDER — CHLOROPROCAINE HCL 1 % IJ SOLN
INTRAMUSCULAR | Status: DC | PRN
  Administered 2012-09-29: 10 mL

## 2012-09-29 MED ORDER — LACTATED RINGERS IV SOLN
INTRAVENOUS | Status: DC
  Administered 2012-09-29 (×2): via INTRAVENOUS

## 2012-09-29 MED ORDER — OXYCODONE-ACETAMINOPHEN 5-325 MG PO TABS: 1.0000 | ORAL_TABLET | ORAL | Status: DC | PRN

## 2012-09-29 MED ORDER — PROPOFOL 10 MG/ML IV EMUL
INTRAVENOUS | Status: DC | PRN
  Administered 2012-09-29 (×3): 20 mg via INTRAVENOUS
  Administered 2012-09-29: 10 mg via INTRAVENOUS

## 2012-09-29 MED ORDER — ALBUTEROL SULFATE (5 MG/ML) 0.5% IN NEBU
2.5000 mg | INHALATION_SOLUTION | Freq: Once | RESPIRATORY_TRACT | Status: AC
  Administered 2012-09-29: 2.5 mg via RESPIRATORY_TRACT
  Filled 2012-09-29: qty 0.5

## 2012-09-29 SURGICAL SUPPLY — 21 items
CATH ROBINSON RED A/P 16FR (CATHETERS) ×2 IMPLANT
DECANTER SPIKE VIAL GLASS SM (MISCELLANEOUS) ×2 IMPLANT
GLOVE BIOGEL PI IND STRL 6.5 (GLOVE) ×1 IMPLANT
GLOVE BIOGEL PI INDICATOR 6.5 (GLOVE) ×1
GLOVE SURG SS PI 6.0 STRL IVOR (GLOVE) ×2 IMPLANT
GOWN STRL REIN XL XLG (GOWN DISPOSABLE) ×4 IMPLANT
KIT BERKELEY 1ST TRIMESTER 3/8 (MISCELLANEOUS) ×2 IMPLANT
NDL SPNL 22GX3.5 QUINCKE BK (NEEDLE) ×1 IMPLANT
NEEDLE SPNL 22GX3.5 QUINCKE BK (NEEDLE) ×2 IMPLANT
NS IRRIG 1000ML POUR BTL (IV SOLUTION) ×2 IMPLANT
PACK VAGINAL MINOR WOMEN LF (CUSTOM PROCEDURE TRAY) ×2 IMPLANT
PAD OB MATERNITY 4.3X12.25 (PERSONAL CARE ITEMS) ×2 IMPLANT
PAD PREP 24X48 CUFFED NSTRL (MISCELLANEOUS) ×2 IMPLANT
SET BERKELEY SUCTION TUBING (SUCTIONS) ×2 IMPLANT
SYR CONTROL 10ML LL (SYRINGE) ×2 IMPLANT
TOWEL OR 17X24 6PK STRL BLUE (TOWEL DISPOSABLE) ×4 IMPLANT
VACURETTE 10 RIGID CVD (CANNULA) IMPLANT
VACURETTE 7MM CVD STRL WRAP (CANNULA) IMPLANT
VACURETTE 8 RIGID CVD (CANNULA) ×1 IMPLANT
VACURETTE 9 RIGID CVD (CANNULA) IMPLANT
WATER STERILE IRR 1000ML POUR (IV SOLUTION) ×1 IMPLANT

## 2012-09-29 NOTE — Transfer of Care (Signed)
Immediate Anesthesia Transfer of Care Note  Patient: [facility]  Procedure(s) Performed: Procedure(s): DILATATION AND EVACUATION (N/A)  Patient Location: PACU  Anesthesia Type:MAC  Level of Consciousness: awake, alert  and oriented  Airway & Oxygen Therapy: Patient Spontanous Breathing and Patient connected to nasal cannula oxygen  Post-op Assessment: Report given to PACU RN and Post -op Vital signs reviewed and stable  Post vital signs: Reviewed and stable  Complications: No apparent anesthesia complications

## 2012-09-29 NOTE — Anesthesia Preprocedure Evaluation (Addendum)
Anesthesia Evaluation  Patient identified by MRN, date of birth, ID band Patient awake    Reviewed: Allergy & Precautions, H&P , NPO status , Patient's Chart, lab work & pertinent test results, reviewed documented beta blocker date and time   History of Anesthesia Complications Negative for: history of anesthetic complications  Airway Mallampati: I TM Distance: >3 FB Neck ROM: full    Dental  (+) Poor Dentition and Missing   Pulmonary shortness of breath (improved since SVD), asthma (did not use symbicort today, has not used albuterol in over a month) , Current Smoker,    Pulmonary exam normal + wheezing      Cardiovascular hypertension (h/o GHTN with pregnancies, no meds), negative cardio ROS  Rhythm:regular Rate:Normal  Reports occasional chest pains that come "randomly", and resolve with rest.   Neuro/Psych  Headaches, PSYCHIATRIC DISORDERS (PP depression on zoloft, anxiety) Back pain, scoliosis    GI/Hepatic negative GI ROS, Neg liver ROS,   Endo/Other  negative endocrine ROS  Renal/GU negative Renal ROS  Female GU complaint (retained POC s/p vaginal delivery on 08/12/12)     Musculoskeletal   Abdominal   Peds  Hematology negative hematology ROS (+)   Anesthesia Other Findings   Reproductive/Obstetrics (+) Breast feeding                           Anesthesia Physical Anesthesia Plan  ASA: II  Anesthesia Plan: MAC   Post-op Pain Management:    Induction:   Airway Management Planned:   Additional Equipment:   Intra-op Plan:   Post-operative Plan:   Informed Consent: I have reviewed the patients History and Physical, chart, labs and discussed the procedure including the risks, benefits and alternatives for the proposed anesthesia with the patient or authorized representative who has indicated his/her understanding and acceptance.   Dental Advisory Given  Plan Discussed with:  CRNA and Surgeon  Anesthesia Plan Comments:        Anesthesia Quick Evaluation

## 2012-09-29 NOTE — Op Note (Signed)
[name] PROCEDURE DATE: 09/29/2012  PREOPERATIVE DIAGNOSIS: 6 weeks postpartum with retained products of conception. POSTOPERATIVE DIAGNOSIS: The same. PROCEDURE:     Dilation and Evacuation. SURGEON:  Dr. Catalina Antigua  INDICATIONS: 37 y.o. [id number] s/p SVD on 3/4 with persistent bleeding and ultrasound findings consistent with retained products of conception, needing surgical completion.  Risks of surgery were discussed with the patient including but not limited to: bleeding which may require transfusion; infection which may require antibiotics; injury to uterus or surrounding organs;need for additional procedures including laparotomy or laparoscopy; possibility of intrauterine scarring which may impair future fertility; and other postoperative/anesthesia complications. Written informed consent was obtained.    FINDINGS:  A 10-week anteverted uterus, moderate amounts of products of conception, specimen sent to pathology.  ANESTHESIA:    Monitored intravenous sedation, paracervical block. INTRAVENOUS FLUIDS:  1300 ml of LR ESTIMATED BLOOD LOSS:  Less than 20 ml. SPECIMENS:  Products of conception sent to pathology COMPLICATIONS:  None immediate.  PROCEDURE DETAILS:  The patient received intravenous antibiotics while in the preoperative area.  She was then taken to the operating room where general anesthesia was administered and was found to be adequate.  After an adequate timeout was performed, she was placed in the dorsal lithotomy position and examined; then prepped and draped in the sterile manner.   Her bladder was catheterized for an unmeasured amount of clear, yellow urine. A vaginal speculum was then placed in the patient's vagina and a single tooth tenaculum was applied to the anterior lip of the cervix.  A paracervical block using 1% Marcaine was administered. The cervix was gently dilated to accommodate a 8 mm suction curette that was gently advanced to the uterine fundus.  The  suction device was then activated and curette slowly rotated to clear the uterus of products of conception.  A sharp curettage was then performed to confirm complete emptying of the uterus. There was minimal bleeding noted and the tenaculum removed with good hemostasis noted.   All instruments were removed from the patient's vagina. The patient tolerated the procedure well and was taken to the recovery area awake, and in stable condition.  The patient will be discharged to home as per PACU criteria.  Routine postoperative instructions given.  She was prescribed Percocet, Ibuprofen and Colace.  She will follow up in the clinic in 2 weeks for postoperative evaluation.

## 2012-09-29 NOTE — Anesthesia Postprocedure Evaluation (Signed)
  Anesthesia Post-op Note  Patient: [facility]  Procedure(s) Performed: Procedure(s): DILATATION AND EVACUATION (N/A)  Patient is awake and responsive. Pain and nausea are reasonably well controlled. Vital signs are stable and clinically acceptable. Oxygen saturation is clinically acceptable. There are no apparent anesthetic complications at this time. Patient is ready for discharge.

## 2012-09-29 NOTE — H&P (Signed)
Anne Olson is an 37 y.o. female G2P2 s/p vaginal delivery on 08/12/2012 with persistent heavy bleeding and ultrasound finding consistent with retained products of conception. Patient is here today for scheduled dilatation and curettage.  Pertinent Gynecological History: Menses: flow is excessive with use of 4-5 pads or tampons on heaviest days Bleeding: dysfunctional uterine bleeding Contraception: Depo-Provera injections DES exposure: denies Blood transfusions: none Sexually transmitted diseases: no past history Previous GYN Procedures: n/a  Last mammogram: n/a OB History: G2, P2   Menstrual History: No LMP recorded. Patient is not currently having periods (Reason: Lactating).    Past Medical History  Diagnosis Date  . Heart murmur   . Infection     Yeast inf;gets freq w/ antibxs or scented soaps  . Infection     BV;recently treated for BV completed Flagyl  . Anemia     iron supplements in the past  . Ovarian cyst in pregnancy 2013    seen on recent US  . Benign breast cyst in female 1995    had bx done was normal  . DUB (dysfunctional uterine bleeding) 1995  . Asthma     has albuterol, advair prn, no attack x 2 years;triggered by strong smells (bleach and smoke)  . Anxiety     has been prescribed Xanax  . Preeclampsia 1994    induced @ 38 weeks  . Headache     during and prior to pregnancy;usually Rt side of head;can effect vision  . 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 Memory loss 2013    Currently having difficult time remembering things  . Scoliosis     Past Surgical History  Procedure Laterality Date  . No past surgeries    . Wisdom tooth extraction  05/2011    all 4 removed    Family History  Problem Relation Age of Onset  . Cancer Mother     Throat  . Cancer Father     Throat  . Hypertension Father   . Hypertension Paternal Grandmother   . Hypertension Paternal Aunt   . Diabetes Maternal Aunt   . Asthma Sister   . Asthma  Brother   . Asthma Cousin   . Asthma Paternal Aunt   . Heart failure Paternal Grandmother   . Thyroid disease Father     Hyperthyoid  . Seizures Mother   . Stroke Maternal Uncle   . Stroke Paternal Uncle     x 2  . Ulcers Mother   . Pancreatitis Mother   . Pancreatitis Father   . COPD Father   . Cirrhosis Father     Liver  . Sickle cell trait Other     Nephew    Social History:  reports that she has been smoking Cigarettes.  She has a 2.5 pack-year smoking history. She has never used smokeless tobacco. She reports that she does not drink alcohol or use illicit drugs.  Allergies:  Allergies  Allergen Reactions  . Bee Venom Anaphylaxis and Swelling    Throat swells  . Tomato Anaphylaxis and Hives    & throat swelling    Prescriptions prior to admission  Medication Sig Dispense Refill  . budesonide-formoterol (SYMBICORT) 160-4.5 MCG/ACT inhaler Take 2 puffs first thing in am and then another 2 puffs about 12 hours later.  1 Inhaler  12  . Iron-FA-B Cmp-C-Biot-Probiotic (FUSION PLUS) CAPS Take 1 capsule by mouth 2 (two) times daily.  60 capsule  3  .  Prenat-FeFum-FePo-FA-Omega 3 (CONCEPT DHA) 53.5-38-1 MG CAPS Take 1 capsule by mouth daily.  30 capsule  11  . sertraline (ZOLOFT) 50 MG tablet Take 1 tablet (50 mg total) by mouth daily.  30 tablet  11  . albuterol (PROVENTIL HFA;VENTOLIN HFA) 108 (90 BASE) MCG/ACT inhaler Inhale 2 puffs into the lungs every 4 (four) hours as needed for wheezing.  1 Inhaler  2  . cyclobenzaprine (FLEXERIL) 10 MG tablet Take 1 tablet (10 mg total) by mouth every 8 (eight) hours as needed for muscle spasms.  30 tablet  0  . hydrocortisone-pramoxine (PROCTOFOAM HC) rectal foam Place 1 applicator rectally 2 (two) times daily.  10 g  0  . hydrOXYzine (VISTARIL) 50 MG capsule Take 1 capsule (50 mg total) by mouth every 6 (six) hours as needed for itching.  30 capsule  0  . ibuprofen (ADVIL,MOTRIN) 600 MG tablet Take 1 tablet (600 mg total) by mouth every  6 (six) hours.  30 tablet  0  . medroxyPROGESTERone (DEPO-PROVERA) 150 MG/ML injection Inject 1 mL (150 mg total) into the muscle every 3 (three) months.  1 mL  0  . oxyCODONE-acetaminophen (PERCOCET/ROXICET) 5-325 MG per tablet Take 1-2 tablets by mouth every 4 (four) hours as needed.  10 tablet  0  . phenylephrine-shark liver oil-mineral oil-petrolatum (PREPARATION H) 0.25-3-14-71.9 % rectal ointment Place rectally 2 (two) times daily as needed for hemorrhoids.  30 g  0  . triamcinolone (KENALOG) 0.025 % cream Apply 1 application topically daily as needed (For skin allergy to ticks and fleas).      . valACYclovir (VALTREX) 500 MG tablet Take 1 tablet (500 mg total) by mouth 2 (two) times daily.  60 tablet  1    Review of Systems  All other systems reviewed and are negative.    Blood pressure 142/91, pulse 86, temperature 97.5 F (36.4 C), temperature source Oral, resp. rate 20, SpO2 100.00%. Physical Exam GENERAL: Well-developed, well-nourished female in no acute distress.  HEENT: Normocephalic, atraumatic. Sclerae anicteric.  NECK: Supple. Normal thyroid.  LUNGS: Clear to auscultation bilaterally.  HEART: Regular rate and rhythm. BREASTS: Symmetric in size. No palpable masses or lymphadenopathy, skin changes, or nipple drainage. ABDOMEN: Soft, nontender, nondistended. No organomegaly. PELVIC: Deferred to OR EXTREMITIES: No cyanosis, clubbing, or edema, 2+ distal pulses.   Results for orders placed during the hospital encounter of 09/29/12 (from the past 24 hour(s))  CBC     Status: None   Collection Time    09/29/12  8:21 AM      Result Value Range   WBC 7.9  4.0 - 10.5 K/uL   RBC 4.25  3.87 - 5.11 MIL/uL   Hemoglobin 12.8  12.0 - 15.0 g/dL   HCT 16.1  09.6 - 04.5 %   MCV 89.9  78.0 - 100.0 fL   MCH 30.1  26.0 - 34.0 pg   MCHC 33.5  30.0 - 36.0 g/dL   RDW 40.9  81.1 - 91.4 %   Platelets 212  150 - 400 K/uL    No results found.  Assessment/Plan: 37 yo G2P2 with  retained products of conception here for D&C - Risks, benefits and alternatives were explained including but not limited to risks of bleeding infection, uterine perforation and damage to adjacent organs. Patient verbalized understanding and all questions were answered.  Clairessa Boulet 09/29/2012, 8:59 AM

## 2012-09-30 ENCOUNTER — Encounter (HOSPITAL_COMMUNITY): Payer: Self-pay | Admitting: Obstetrics and Gynecology

## 2012-10-01 ENCOUNTER — Encounter: Payer: Self-pay | Admitting: *Deleted

## 2012-10-01 ENCOUNTER — Telehealth: Payer: Self-pay | Admitting: *Deleted

## 2012-10-01 NOTE — Telephone Encounter (Signed)
[name] Stands and informed her we can send a letter stating the plan is that she can be realeased after her 10/20/12 postop  visit assuming everything is normal. [name] asked that I fax a copy to " [facility] [facility] [place] at [phone] as well as leave a copy for her to pick up for her records. [name] also repeated that request to [name] , LPN in our clinic.  Also asked if the bloating from her surgery would go away soon. Instructed her to avoid straws, avoid carbonated beverages, avoid any gas causing foods,and increase ambulation to help with bloating. [name] has an appt 10/17/12 with Dr. Debroah Loop for nexplanon that was scheduled after her postpartum visit. Also has 2 week postop visit scheduled 10/20/12 with Dr. Jolayne Panther for D&E she had for retained POC. On 09/29/12. Informed her these may be able to be combined-and may need post op visit first- need to check with provider.

## 2012-10-01 NOTE — Telephone Encounter (Signed)
Anne Olson called and left a message stating she is calling to get a letter for work stating she will be released to work after her postpartum check 10/17/12

## 2012-10-03 ENCOUNTER — Telehealth: Payer: Self-pay | Admitting: Obstetrics and Gynecology

## 2012-10-03 NOTE — Telephone Encounter (Addendum)
Message copied by Toula Moos on Fri Oct 03, 2012  9:43 AM  ------Per Felicity Coyer., Ok to combine nexplanon and post-op appointment on 10/17/12. Patient notified and agrees.       Message from: CONSTANT, PEGGY      Created: Thu Oct 02, 2012  8:27 PM       Patient may receive her letter for work. i don't see why she can't have post-op visit and Nexplanon insertion together on 5/9. Please double check if that is ok with kelly for billing purposes.            Thanks            peggy ------

## 2012-10-08 ENCOUNTER — Telehealth: Payer: Self-pay | Admitting: *Deleted

## 2012-10-08 NOTE — Telephone Encounter (Signed)
Called patient to make sure patient has received message she has one appointment for post op and nexaplanon- left message explaining she has had her appointments merged and has only one scheduled for 10/17/12. Please call if you have questions

## 2012-10-08 NOTE — Telephone Encounter (Signed)
Message copied by Gerome Apley on Wed Oct 08, 2012 11:34 AM ------      Message from: CONSTANT, PEGGY      Created: Thu Oct 02, 2012  8:27 PM       Patient may receive her letter for work. i don't see why she can't have post-op visit and Nexplanon insertion together on 5/9. Please double check if that is ok with kelly for billing purposes.            Thanks            peggy ------

## 2012-10-17 ENCOUNTER — Encounter: Payer: Self-pay | Admitting: Obstetrics & Gynecology

## 2012-10-17 ENCOUNTER — Ambulatory Visit (INDEPENDENT_AMBULATORY_CARE_PROVIDER_SITE_OTHER): Payer: Medicaid Other | Admitting: Obstetrics & Gynecology

## 2012-10-17 DIAGNOSIS — Z09 Encounter for follow-up examination after completed treatment for conditions other than malignant neoplasm: Secondary | ICD-10-CM

## 2012-10-17 NOTE — Progress Notes (Signed)
Pt had D&C on 4/21 due to retained POC.  Pt also here for Nexplanon insertion and evaluation of Norplant removal.

## 2012-10-17 NOTE — Progress Notes (Signed)
Subjective:  #Post op visit s/p D+E for retained products of conception Patient states she has had light spotting since the D+E on 4/21 but she was also started on depo-provera. Also occasional pelvic cramping but mild. No fever/chills/nausea/vomiting #Nexplanon insertion/presence of norplant Patient had depo-provera recently but wants to convert to Nexplanon for birth control. Has norplant in place from 16 years ago. No pain at site and rods still visible.  #Elevated blood pressure  BP Readings from Last 3 Encounters:  10/17/12 122/78  09/29/12 112/60  09/29/12 112/60  Home BP monitoring-yes and occasionally as high as 160 sbp.  Compliant with medications-no medications Denies any CP,  SOB, blurry vision, LE edema, transient weakness, orthopnea, PND.    ROS--See HPI  Past Medical History-postpartum depression, history retained products of conception, anxiety, smoker Reviewed problem list.  Medications- reviewed and updated Chief complaint-noted  Objective: BP 122/78  Pulse 96  Temp(Src) 97.1 F (36.2 C) (Oral)  Resp 20  Ht 5' 8.5" (1.74 m)  Wt 123 lb 1.6 oz (55.838 kg)  BMI 18.44 kg/m2 Gen: NAD, resting comfortably on table Skin: warm, dry Neuro: grossly normal, moves all extremities MSK: Norplant, 6 rods noted in LUE, easily palpable/visible Pelvic: cervix normal in appearance, external genitalia normal, no adnexal masses or tenderness, no cervical motion tenderness, uterus normal size, shape, and consistency and vagina normal without discharge  Assessment/Plan:  #Post op visit s/p D+E for retained products of conception-doing well, unclear if spotting from depo or persistent after D+E but suspect the former.  #Nexplanon insertion/presence of norplant Will schedule for norplant removal and nexplanon insertion on same day within next few weeks #Elevated blood pressure  Intermittently elevated in the past up to 168/109 on 4/16 but all other readings since March <140/90.  Initially elevated today on automated cuff but on manual repeat decreased to 122/78. Discussed with patient reasonable for her to seek out a PCP but no acute indication for starting BP meds. Should be watched for now. If in future elevated, I would be happy to see her at [facility] if needed.    Dr. Debroah Loop has seen, examined and evaluated the patient. We have discussed the history, exam, assessment, and plan as noted above. He agrees with management.  Aldine Contes. Marti Sleigh, MD, PGY2 10/17/2012 10:51 AM

## 2012-10-17 NOTE — Patient Instructions (Signed)
Things look good as far as your surgery. You may be having some spotting from depo-provera as well.   Please see [place] back in 2 weeks so we can remove your norplant and put in your new nexplanon.

## 2012-10-20 ENCOUNTER — Ambulatory Visit: Payer: Medicaid Other | Admitting: Obstetrics and Gynecology

## 2012-10-27 ENCOUNTER — Ambulatory Visit (INDEPENDENT_AMBULATORY_CARE_PROVIDER_SITE_OTHER): Payer: Medicaid Other | Admitting: Obstetrics & Gynecology

## 2012-10-27 ENCOUNTER — Encounter: Payer: Self-pay | Admitting: *Deleted

## 2012-10-27 ENCOUNTER — Encounter: Payer: Self-pay | Admitting: Obstetrics & Gynecology

## 2012-10-27 VITALS — BP 138/83 | HR 82 | Ht 68.5 in | Wt 124.4 lb

## 2012-10-27 DIAGNOSIS — Z01812 Encounter for preprocedural laboratory examination: Secondary | ICD-10-CM

## 2012-10-27 DIAGNOSIS — Z309 Encounter for contraceptive management, unspecified: Secondary | ICD-10-CM

## 2012-10-27 DIAGNOSIS — Z30017 Encounter for initial prescription of implantable subdermal contraceptive: Secondary | ICD-10-CM

## 2012-10-27 MED ORDER — ETONOGESTREL 68 MG ~~LOC~~ IMPL
68.0000 mg | DRUG_IMPLANT | Freq: Once | SUBCUTANEOUS | Status: AC
  Administered 2012-10-27: 68 mg via SUBCUTANEOUS

## 2012-10-27 MED ORDER — ETONOGESTREL 68 MG ~~LOC~~ IMPL: 68.0000 mg | DRUG_IMPLANT | Freq: Once | SUBCUTANEOUS | Status: DC

## 2012-10-27 NOTE — Progress Notes (Signed)
Patient ID: Anne Olson, female   DOB: 1975-06-14, 37 y.o.   MRN: [id number] No LMP recorded. Patient is not currently having periods (Reason: Lactating). Requests Nexplanon insertion. Pt counseled. Does not wand Norplant removed. Patient given informed consent, signed copy in the chart, time out was performed. Pregnancy test was negative. Appropriate time out taken.  Patient's right arm was prepped and draped in the usual sterile fashion.. The ruler used to measure and mark insertion area.  Pt was prepped with alcohol swab and then injected with 3.5 cc of 1% lidocaine .  Pt was prepped with betadine, Implanon removed form packaging,  Device confirmed in needle, then inserted full length of needle and withdrawn per handbook instructions.  Pt insertion site covered with gauze.   Minimal blood loss.  Pt tolerated the procedure well.   Report sx of pain,swelling or bruising.  Adam Phenix, MD 10/27/2012

## 2012-10-27 NOTE — Patient Instructions (Addendum)

## 2012-10-27 NOTE — Addendum Note (Signed)
Addended by: Toula Moos on: 10/27/2012 03:44 PM   Modules accepted: Orders

## 2012-11-10 ENCOUNTER — Ambulatory Visit: Payer: Medicaid Other | Admitting: Internal Medicine

## 2013-04-16 ENCOUNTER — Other Ambulatory Visit: Payer: Self-pay

## 2013-09-18 IMAGING — US US OB COMP +14 WK
2 series · 12 of 28 positions shown · non-contrast
Comparison: none

[Series 1: us ob follow up · 3 of 16 slices shown (1 of 2)]
[im 3/16]
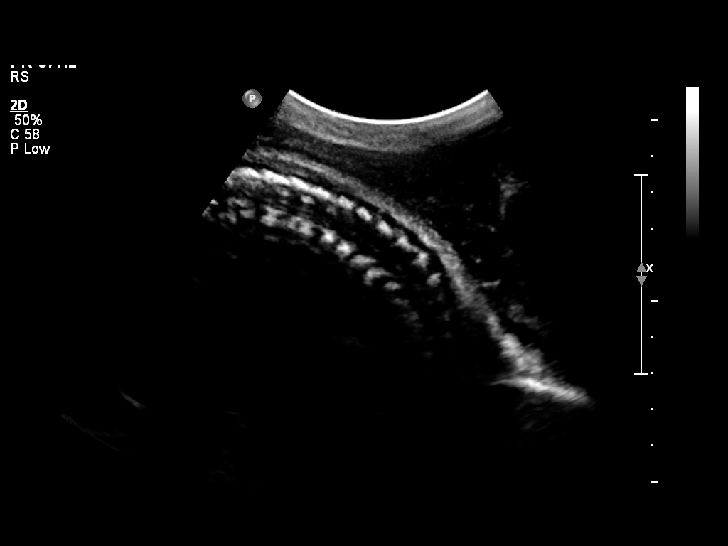
[im 7/16]
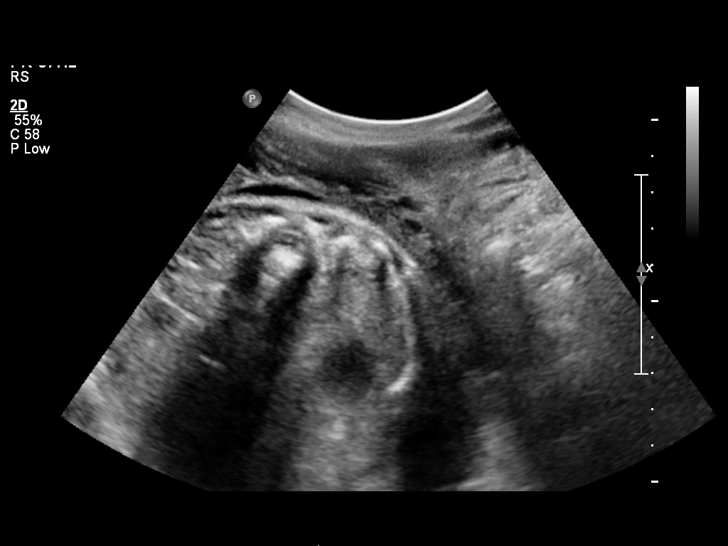
[im 11/16]
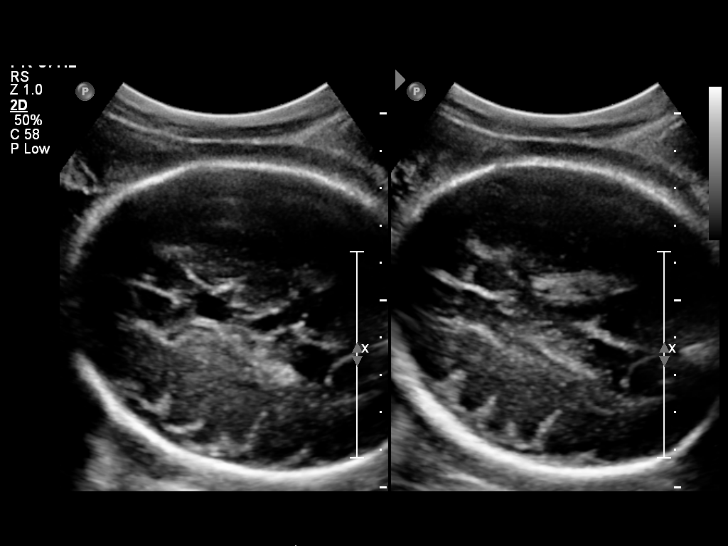

[Series 1: us ob follow up · 9 of 43 slices shown (2 of 2)]
[im 1/43]
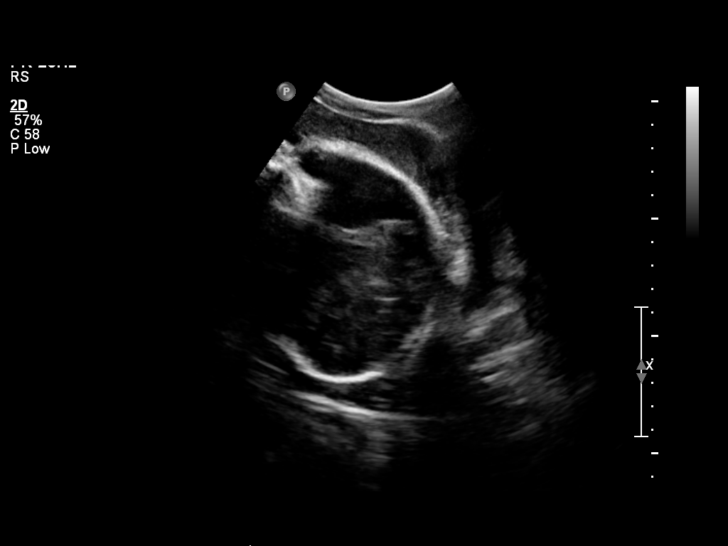
[im 5/43]
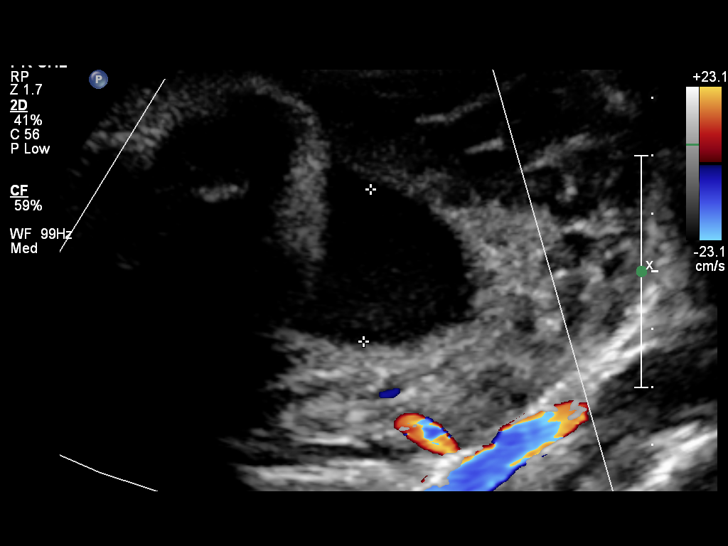
[im 9/43]
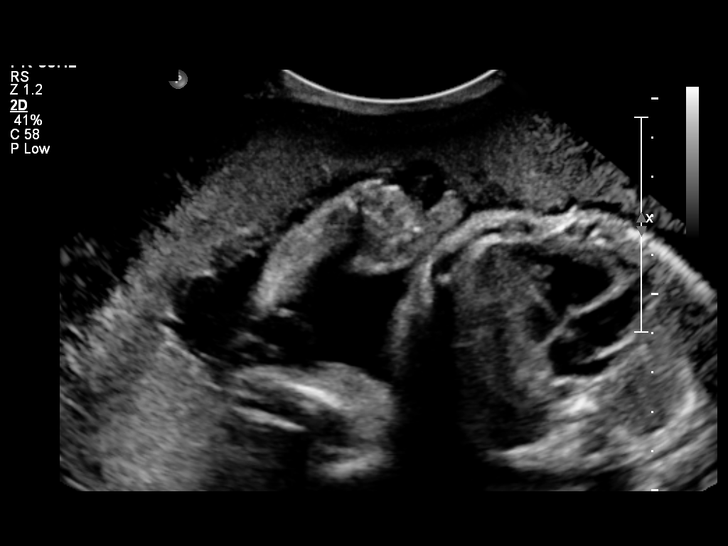
[im 16/43]
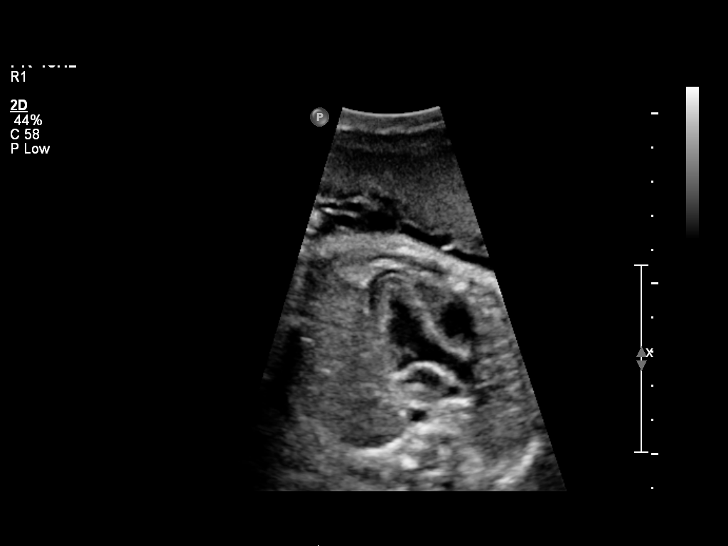
[im 20/43]
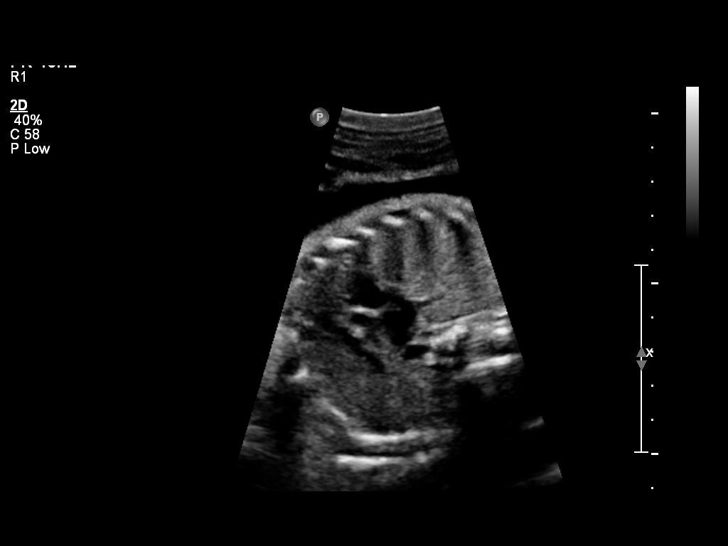
[im 25/43]
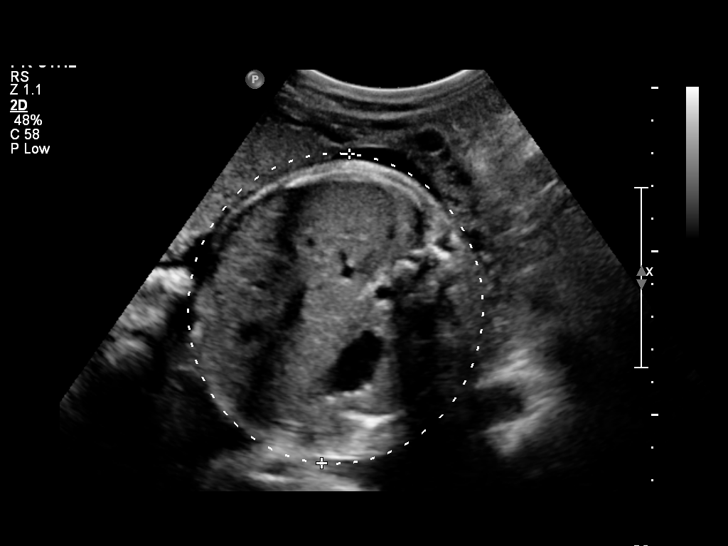
[im 31/43]
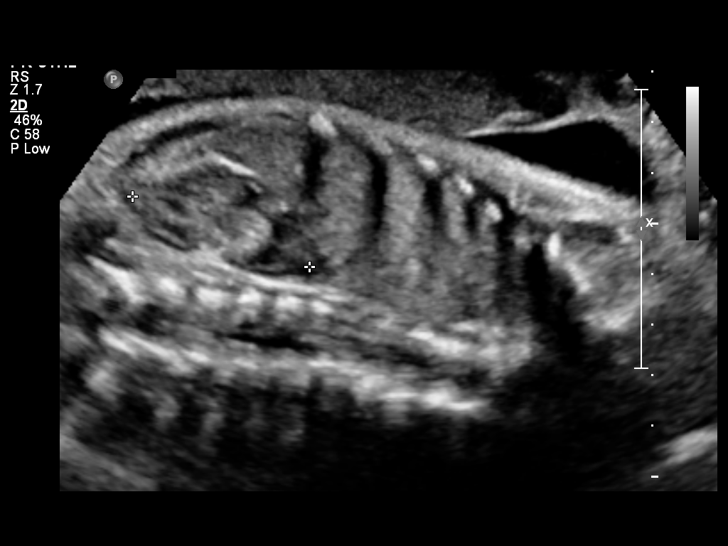
[im 36/43]
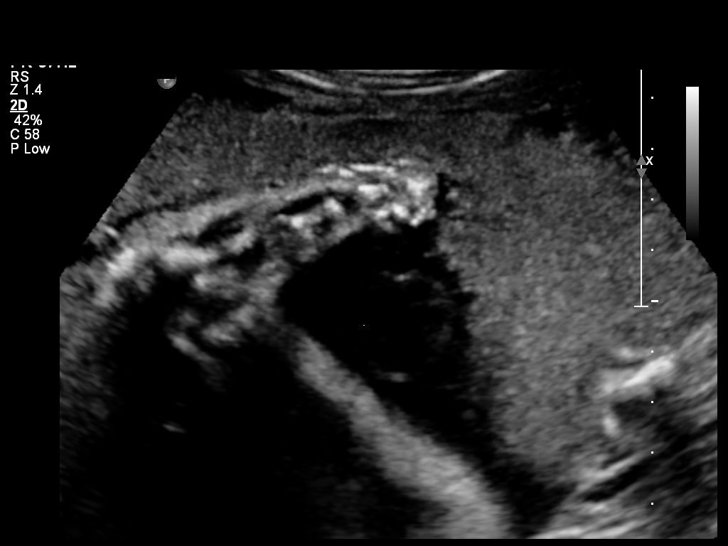
[im 40/43]
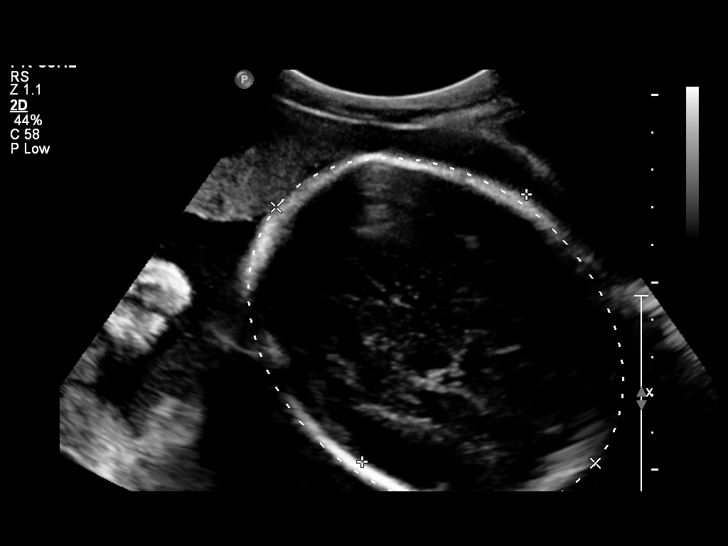

[12 of 28 positions shown; findings below may reference images not displayed]

OBSTETRICS REPORT
                      (Signed Final 06/26/2012 [DATE])

Service(s) Provided

 US OB COMP + 14 WK                                    76805.1
Indications

 Size less than dates (Small for gestational [AGE]
 FGR)
 Basic anatomic survey
Fetal Evaluation

 Num Of Fetuses:    1
 Fetal Heart Rate:  144                         bpm
 Cardiac Activity:  Observed
 Presentation:      Cephalic
 Placenta:          Anterior, above cervical os
 P. Cord            Visualized, central
 Insertion:

 Amniotic Fluid
 AFI FV:      Subjectively within normal limits
 AFI Sum:     11.99   cm      31   %Tile     Larg Pckt:   4.32   cm
 RUQ:   4.32   cm    RLQ:    2.8    cm    LUQ:   2.25    cm   LLQ:    2.62   cm
Biometry

 BPD:     82.9  mm    G. Age:   33w 3d                CI:        74.61   70 - 86
                                                      FL/HC:      18.8   19.3 -

 HC:     304.6  mm    G. Age:   33w 6d       85  %    HC/AC:      1.05   0.96 -

 AC:     289.5  mm    G. Age:   33w 0d       91  %    FL/BPD:     69.0   71 - 87
 FL:      57.2  mm    G. Age:   30w 0d       11  %    FL/AC:      19.8   20 - 24

 Est. FW:    3086  gm      4 lb 4 oz     76  %
Gestational Age

 LMP:           31w 1d       Date:   11/21/11                 EDD:   08/27/12
 U/S Today:     32w 4d                                        EDD:   08/17/12
 Best:          31w 1d    Det. By:   LMP  (11/21/11)          EDD:   08/27/12
Anatomy

 Cranium:          Appears normal         Aortic Arch:      Basic anatomy
                                                            exam per order
 Fetal Cavum:      Appears normal         Ductal Arch:      Basic anatomy
                                                            exam per order
 Ventricles:       Appears normal         Diaphragm:        Appears normal
 Choroid Plexus:   Appears normal         Stomach:          Appears normal, left
                                                            sided
 Cerebellum:       Appears normal         Abdomen:          Appears normal
 Posterior Fossa:  Appears normal         Abdominal Wall:   Not well visualized
 Nuchal Fold:      Not applicable (>20    Cord Vessels:     Appears normal (3
                   wks GA)                                  vessel cord)
 Face:             Profile appears        Kidneys:          Appear normal
                   normal
 Lips:             Appears normal         Bladder:          Appears normal
 Heart:            Appears normal         Spine:            Appears normal
                   (4CH, axis, and
                   situs)
 RVOT:             Appears normal         Lower             Not well visualized
                                          Extremities:
 LVOT:             Appears normal         Upper             Not well visualized
                                          Extremities:

 Other:  Technically difficult due to advanced GA and fetal position.
Cervix Uterus Adnexa

 Cervical Length:   4.14      cm

 Cervix:       Normal appearance by transabdominal scan.

 Left Ovary:   Within normal limits.
 Right Ovary:  Within normal limits.
 Adnexa:     No abnormality visualized.
Impression

 Single intrauterine gestation demonstrating an estimated
 gestational age by ultrasound of 32w 4d. This is correlated
 with expected estimated gestational age by LMP of 31w 1d.
 EFW is currently at the 76%.

 Visualized fetal anatomy appears normal with the overall
 assessment compromised by advanced gestational age and
 fetal position. No focal placental abnormality is seen.

 Subjectively and quantitatively normal amniotic fluid volume.

 Normal cervical length and appearance.

 questions or concerns.

## 2013-11-03 IMAGING — US US OB FOLLOW-UP
1 series · 12 of 28 positions shown · non-contrast
Comparison: none

[Series 1: us ob follow up · 47 acquisitions, 12 frames shown]
[im 2/47]
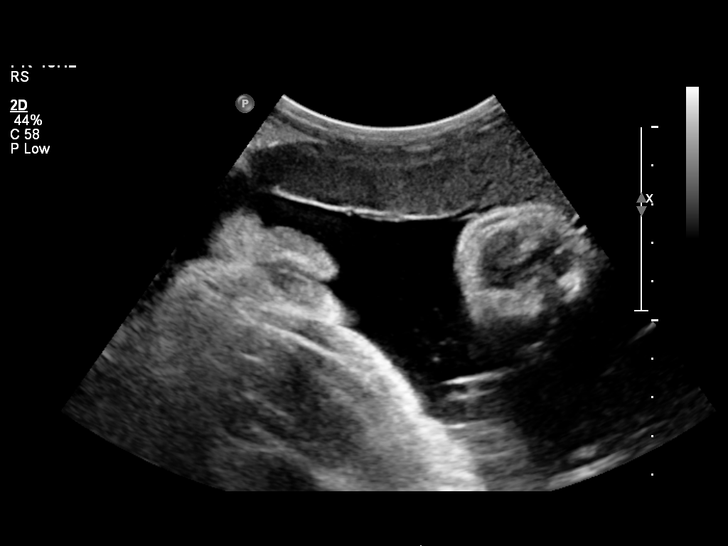
[im 6/47]
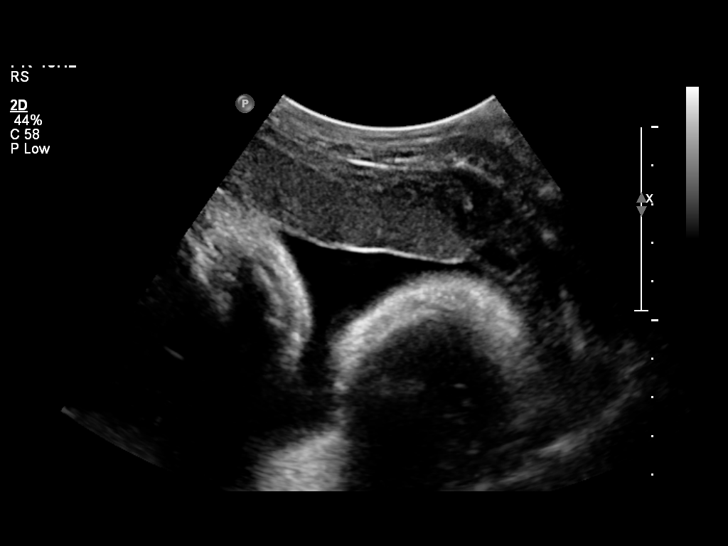
[im 9/47]
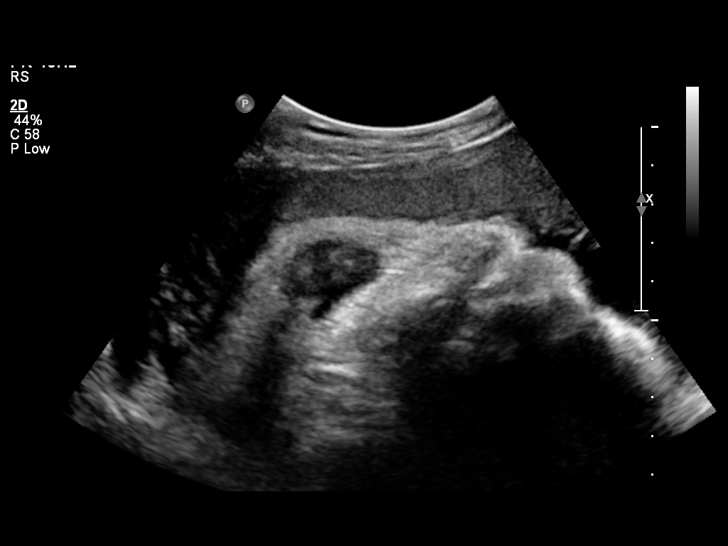
[im 14/47]
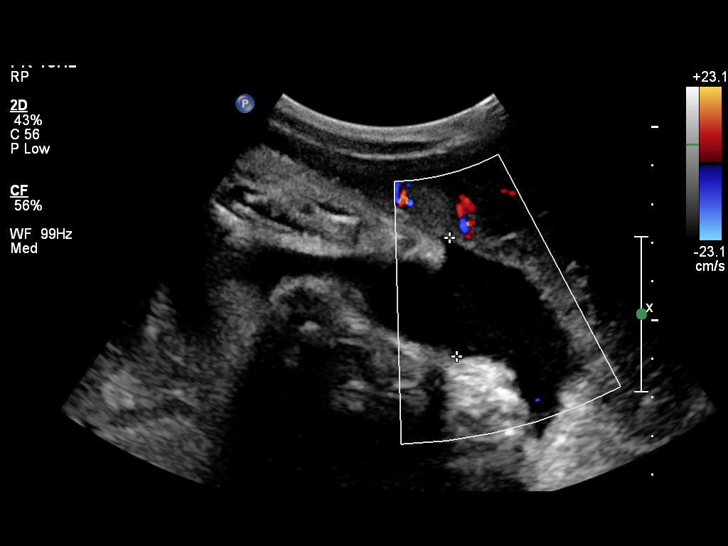
[im 18/47]
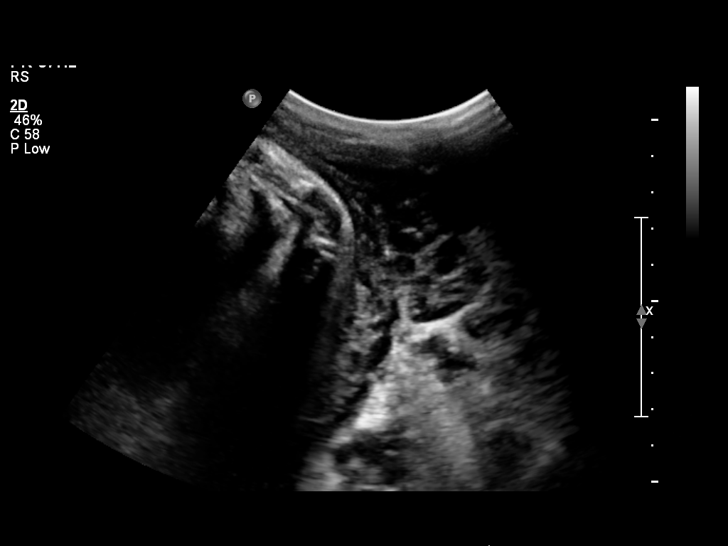
[im 21/47]
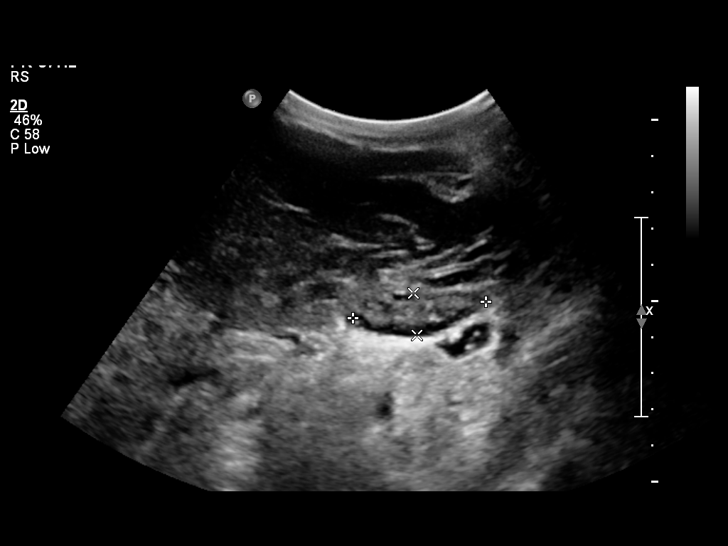
[im 26/47]
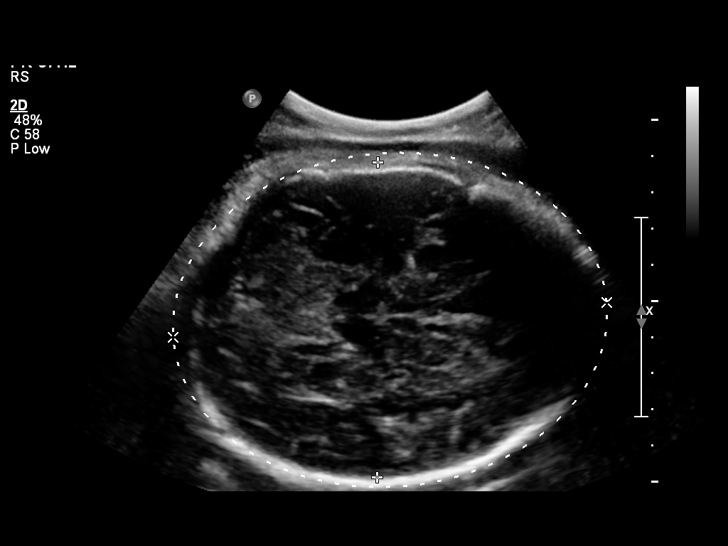
[im 29/47]
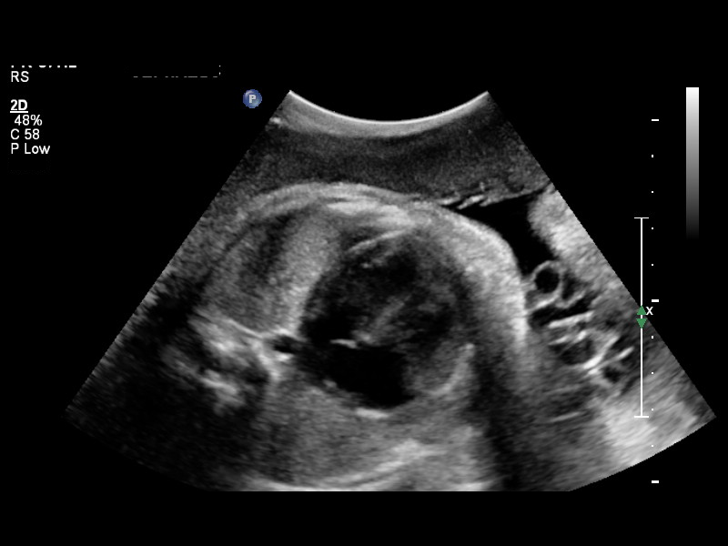
[im 33/47]
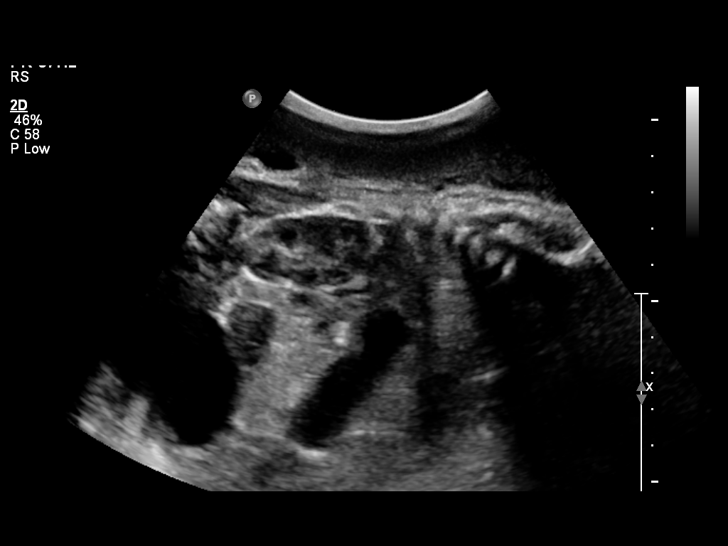
[im 38/47]
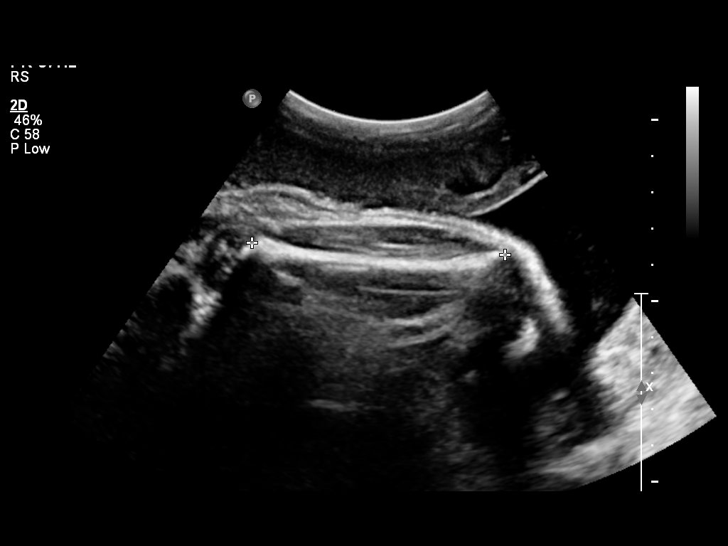
[im 41/47]
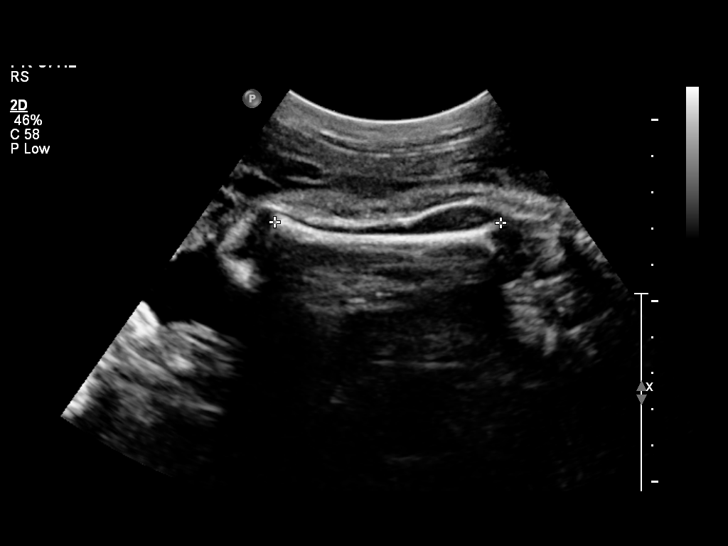
[im 45/47]
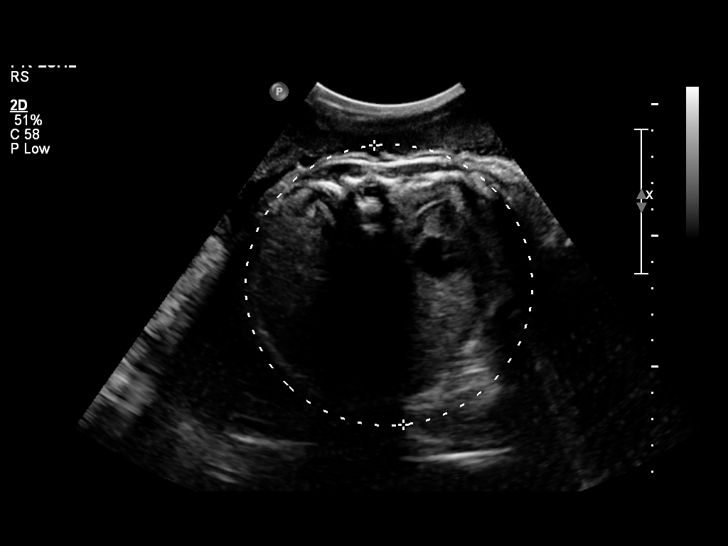

[12 of 28 positions shown; findings below may reference images not displayed]

OBSTETRICS REPORT
                      (Signed Final 08/11/2012 [DATE])

Service(s) Provided

 US OB FOLLOW UP                                       76816.1
Indications

 Advanced maternal age (AMA), Multigravida
 Hypertension - Gestational
 Follow-up incomplete fetal anatomic evaluation
Fetal Evaluation

 Num Of Fetuses:    1
 Fetal Heart Rate:  132                         bpm
 Cardiac Activity:  Observed
 Presentation:      Cephalic
 Placenta:          Anterior, above cervical os
 P. Cord            Previously Visualized
 Insertion:

 Amniotic Fluid
 AFI FV:      Subjectively within normal limits
 AFI Sum:     14.28   cm      54   %Tile     Larg Pckt:   5.36   cm
 RUQ:   4.28   cm    RLQ:    2.3    cm    LUQ:   5.36    cm   LLQ:    2.34   cm
Biometry

 BPD:     87.3  mm    G. Age:   35w 2d                CI:        69.31   70 - 86
                                                      FL/HC:      20.8   20.9 -

 HC:     334.8  mm    G. Age:   38w 2d       43  %    HC/AC:      1.00   0.92 -

 AC:     335.2  mm    G. Age:   37w 3d       58  %    FL/BPD:     80.0   71 - 87
 FL:      69.8  mm    G. Age:   35w 6d       11  %    FL/AC:      20.8   20 - 24
 HUM:     60.8  mm    G. Age:   35w 2d       29  %

 Est. FW:    4411  gm    6 lb 12 oz      56  %
Gestational Age

 LMP:           37w 5d       Date:   11/21/11                 EDD:   08/27/12
 U/S Today:     36w 5d                                        EDD:   09/03/12
 Best:          37w 5d    Det. By:   LMP  (11/21/11)          EDD:   08/27/12
Anatomy

 Cranium:          Appears normal         Aortic Arch:      Basic anatomy
                                                            exam per order
 Fetal Cavum:      Previously seen        Ductal Arch:      Basic anatomy
                                                            exam per order
 Ventricles:       Appears normal         Diaphragm:        Previously seen
 Choroid Plexus:   Previously seen        Stomach:          Appears normal, left
                                                            sided
 Cerebellum:       Previously seen        Abdomen:          Appears normal
 Posterior Fossa:  Previously seen        Abdominal Wall:   Not well visualized
 Nuchal Fold:      Not applicable (>20    Cord Vessels:     Previously seen
                   wks GA)
 Face:             Profile previously     Kidneys:          Appear normal
                   seen
 Lips:             Previously seen        Bladder:          Appears normal
 Heart:            Appears normal         Spine:            Previously seen
                   (4CH, axis, and
                   situs)
 RVOT:             Previously seen        Lower             Not well visualized
                                          Extremities:
 LVOT:             Previously seen        Upper             Not well visualized
                                          Extremities:

 Other:  Technically difficult due to advanced GA and fetal position.
Targeted Anatomy

 Fetal Central Nervous System
 Lat. Ventricles:
Cervix Uterus Adnexa

 Cervix:       Not visualized (advanced GA >34 wks)
 Left Ovary:   Size(cm) L: 3.68 x W: 1.97 x H: 1.16  Volume(cc):
 Right Ovary:  Not visualized.
 Adnexa:     No abnormality visualized.
Impression

 Single living IUP with assigned GA of 37w 5d in cephalic
 position. Appropriate fetal growth.
 Normal amniotic fluid volume.
 Visualized fetal anatomy appears normal. Technically
 challenging due to advanced GA and fetal position.

 questions or concerns.

## 2013-11-04 IMAGING — US US FETAL BPP W/O NONSTRESS
1 series · 11 of 11 positions shown · non-contrast
Comparison: none

CLINICAL DATA: Variable decelerations.

[Series 1: us fetal bpp w/o nonstress · non-contrast · 11 acquisitions, 11 frames shown]
[im 1/11]
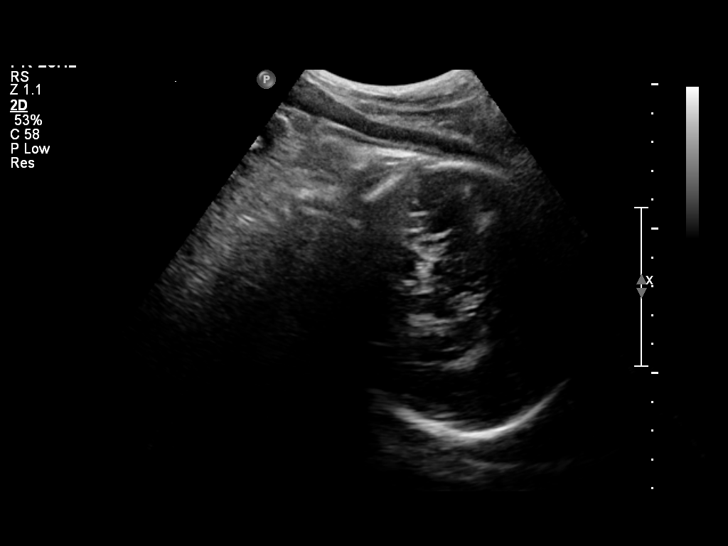
[im 2/11]
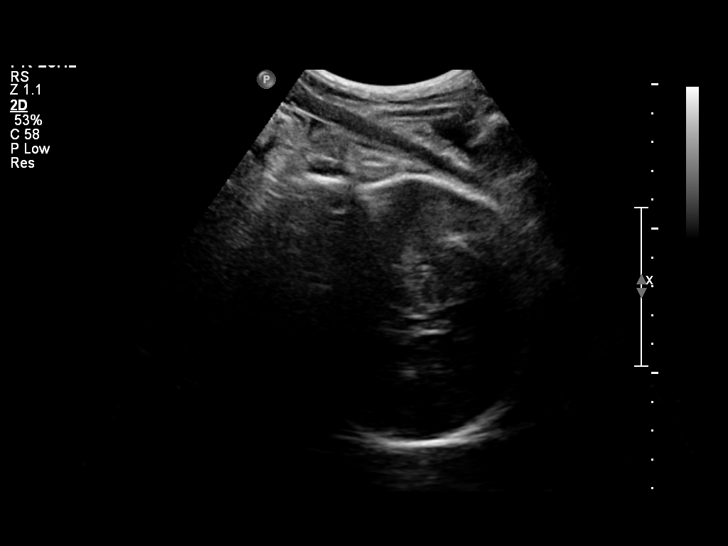
[im 3/11]
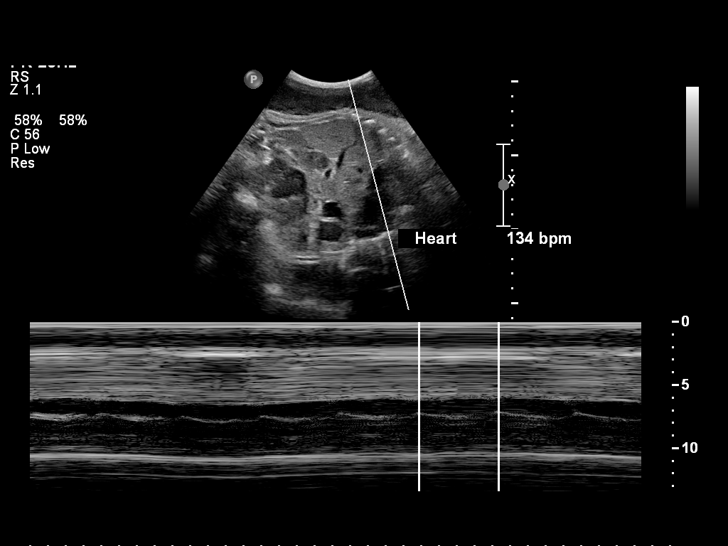
[im 4/11]
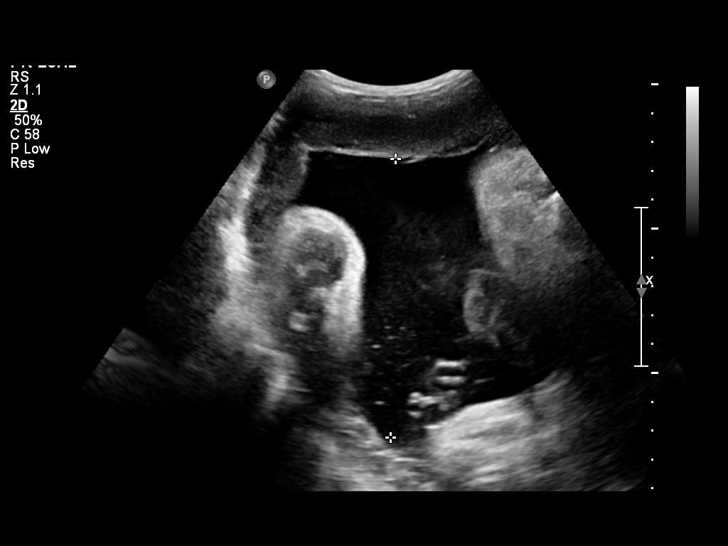
[im 5/11]
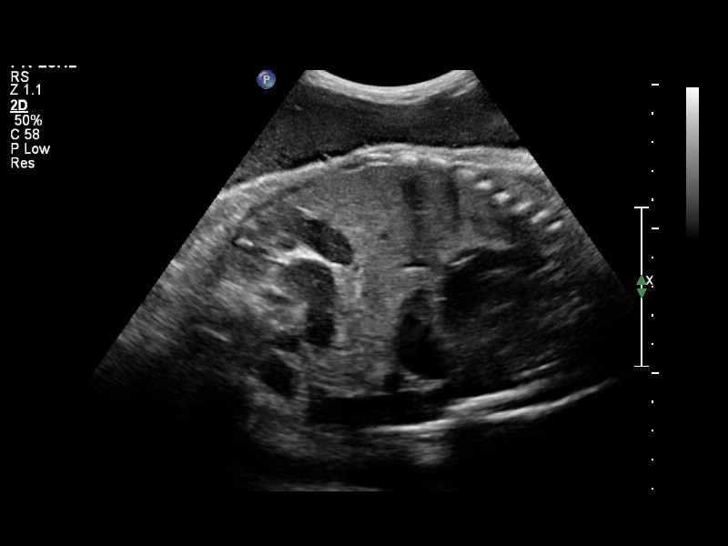
[im 6/11]
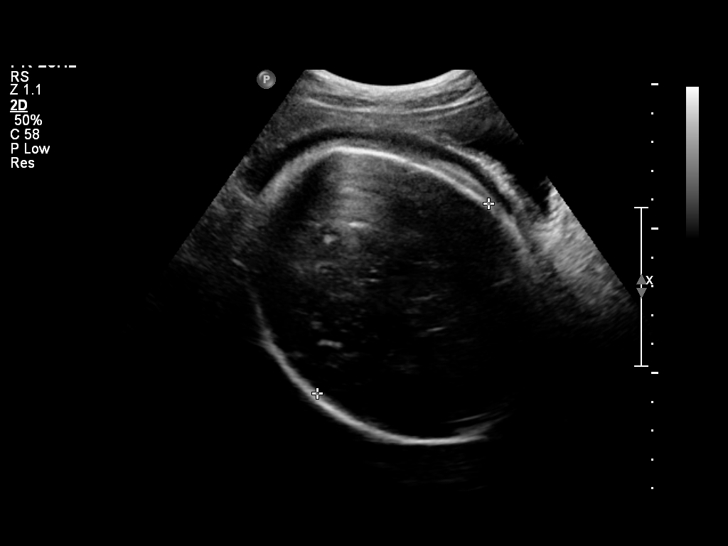
[im 7/11]
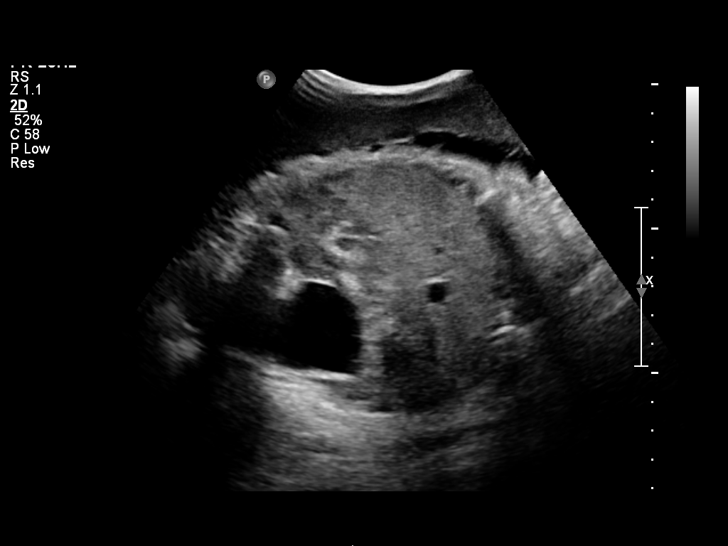
[im 8/11]
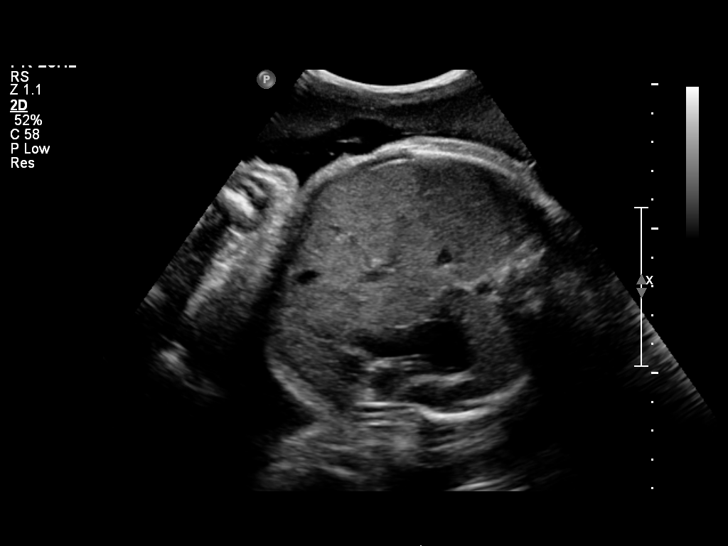
[im 9/11]
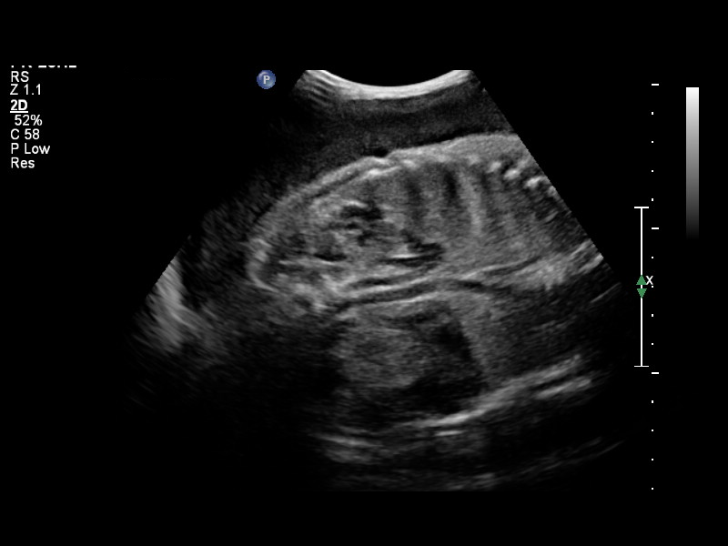
[im 10/11]
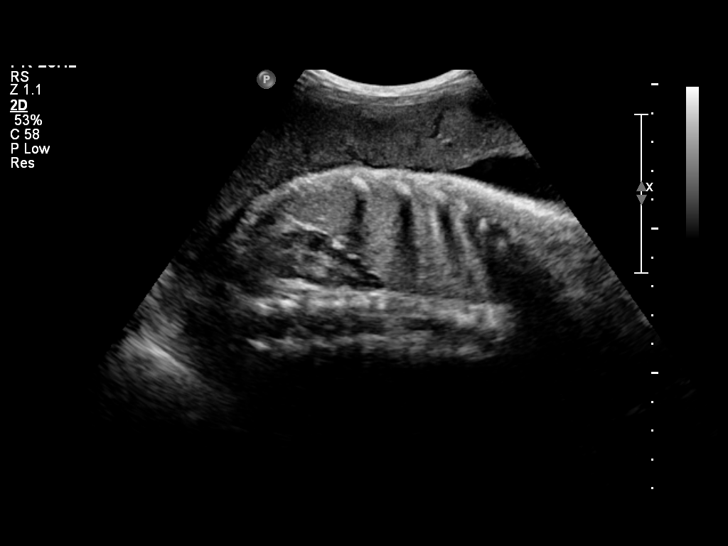
[im 11/11]
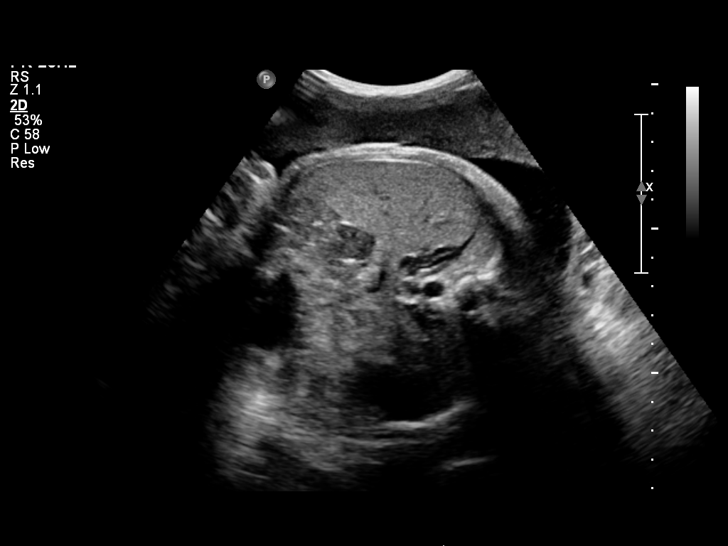

[11 of 11 positions shown; findings below may reference images not displayed]

BIOPHYSICAL PROFILE

Number of Fetuses: 1
Heart Rate: 134 bpm
Presentation: Cephalic
Movement: Yes
Placental Location:  Anterior
Previa:  No
Amniotic Fluid (Subjective):  Normal

Vertical pocket:  9.7 cm

BPD:  8.8 cm      35 w  6 d

BPP:

Movement:  2 / 2        Time:  8 minutes
Breathing: 2 / 2
Tone:   2 / 2
Amniotic Fluid:  2 / 2

Total Score:  8 / 8
IMPRESSION: Single live intrauterine pregnancy noted in cephalic presentation.
Biparietal diameter of 8.8 cm on the recent prior ultrasound
corresponds to a gestational age of 35 weeks 6 days, which matches
the gestational age of 37 weeks 6 days by LMP, reflecting an
estimated date of delivery August 27, 2012.

Biophysical profile score of [DATE], at 8 minutes.

Recommend followup with non-emergent complete OB 14+ wk US
examination for fetal biometric evaluation and anatomic survey if
not already performed.

## 2014-04-12 ENCOUNTER — Encounter: Payer: Self-pay | Admitting: Obstetrics & Gynecology

## 2015-01-01 ENCOUNTER — Encounter (HOSPITAL_COMMUNITY): Payer: Self-pay | Admitting: Emergency Medicine

## 2015-01-01 ENCOUNTER — Emergency Department (HOSPITAL_COMMUNITY)
Admission: EM | Admit: 2015-01-01 | Discharge: 2015-01-01 | Disposition: A | Discharge status: Home / Self Care | Payer: Self-pay | Attending: Emergency Medicine | Admitting: Emergency Medicine

## 2015-01-01 ENCOUNTER — Emergency Department (HOSPITAL_COMMUNITY): Payer: Self-pay

## 2015-01-01 DIAGNOSIS — J45909 Unspecified asthma, uncomplicated: Secondary | ICD-10-CM | POA: Insufficient documentation

## 2015-01-01 DIAGNOSIS — Z7951 Long term (current) use of inhaled steroids: Secondary | ICD-10-CM | POA: Insufficient documentation

## 2015-01-01 DIAGNOSIS — Z8679 Personal history of other diseases of the circulatory system: Secondary | ICD-10-CM | POA: Insufficient documentation

## 2015-01-01 DIAGNOSIS — Z8742 Personal history of other diseases of the female genital tract: Secondary | ICD-10-CM | POA: Insufficient documentation

## 2015-01-01 DIAGNOSIS — Z8619 Personal history of other infectious and parasitic diseases: Secondary | ICD-10-CM | POA: Insufficient documentation

## 2015-01-01 DIAGNOSIS — R000 Tachycardia, unspecified: Secondary | ICD-10-CM | POA: Insufficient documentation

## 2015-01-01 DIAGNOSIS — D649 Anemia, unspecified: Secondary | ICD-10-CM | POA: Insufficient documentation

## 2015-01-01 DIAGNOSIS — R079 Chest pain, unspecified: Secondary | ICD-10-CM | POA: Insufficient documentation

## 2015-01-01 DIAGNOSIS — R197 Diarrhea, unspecified: Secondary | ICD-10-CM | POA: Insufficient documentation

## 2015-01-01 DIAGNOSIS — R42 Dizziness and giddiness: Secondary | ICD-10-CM | POA: Insufficient documentation

## 2015-01-01 DIAGNOSIS — M419 Scoliosis, unspecified: Secondary | ICD-10-CM | POA: Insufficient documentation

## 2015-01-01 DIAGNOSIS — R110 Nausea: Secondary | ICD-10-CM | POA: Insufficient documentation

## 2015-01-01 DIAGNOSIS — R011 Cardiac murmur, unspecified: Secondary | ICD-10-CM | POA: Insufficient documentation

## 2015-01-01 DIAGNOSIS — Z79899 Other long term (current) drug therapy: Secondary | ICD-10-CM | POA: Insufficient documentation

## 2015-01-01 DIAGNOSIS — Z975 Presence of (intrauterine) contraceptive device: Secondary | ICD-10-CM | POA: Insufficient documentation

## 2015-01-01 DIAGNOSIS — R1084 Generalized abdominal pain: Secondary | ICD-10-CM | POA: Insufficient documentation

## 2015-01-01 DIAGNOSIS — Z720 Tobacco use: Secondary | ICD-10-CM | POA: Insufficient documentation

## 2015-01-01 LAB — CBC
HEMATOCRIT: 39.2 % (ref 36.0–46.0)
Hemoglobin: 13.2 g/dL (ref 12.0–15.0)
MCH: 29.7 pg (ref 26.0–34.0)
MCHC: 33.7 g/dL (ref 30.0–36.0)
MCV: 88.1 fL (ref 78.0–100.0)
Platelets: 254 10*3/uL (ref 150–400)
RBC: 4.45 MIL/uL (ref 3.87–5.11)
RDW: 13.4 % (ref 11.5–15.5)
WBC: 5.2 10*3/uL (ref 4.0–10.5)

## 2015-01-01 LAB — HEPATIC FUNCTION PANEL
ALT: 12 U/L — ABNORMAL LOW (ref 14–54)
AST: 17 U/L (ref 15–41)
Albumin: 4.1 g/dL (ref 3.5–5.0)
Alkaline Phosphatase: 68 U/L (ref 38–126)
Total Bilirubin: 0.4 mg/dL (ref 0.3–1.2)
Total Protein: 7.8 g/dL (ref 6.5–8.1)

## 2015-01-01 LAB — BASIC METABOLIC PANEL
Anion gap: 9 (ref 5–15)
CO2: 22 mmol/L (ref 22–32)
Calcium: 9 mg/dL (ref 8.9–10.3)
Chloride: 109 mmol/L (ref 101–111)
Creatinine, Ser: 0.77 mg/dL (ref 0.44–1.00)
GFR calc Af Amer: >60 mL/min (ref >60)
GFR calc non Af Amer: >60 mL/min (ref >60)
Glucose, Bld: 124 mg/dL — ABNORMAL HIGH (ref 65–99)
POTASSIUM: 3.3 mmol/L — AB (ref 3.5–5.1)
Sodium: 140 mmol/L (ref 135–145)

## 2015-01-01 LAB — I-STAT TROPONIN, ED: TROPONIN I, POC: 0 ng/mL (ref 0.00–0.08)

## 2015-01-01 LAB — LIPASE, BLOOD: LIPASE: 16 U/L — AB (ref 22–51)

## 2015-01-01 MED ORDER — LOPERAMIDE HCL 2 MG PO CAPS: 2.0000 mg | ORAL_CAPSULE | Freq: Four times a day (QID) | ORAL | Status: DC | PRN

## 2015-01-01 MED ORDER — ONDANSETRON HCL 4 MG PO TABS: 4.0000 mg | ORAL_TABLET | Freq: Four times a day (QID) | ORAL | Status: DC

## 2015-01-01 MED ORDER — SODIUM CHLORIDE 0.9 % IV BOLUS (SEPSIS)
2000.0000 mL | Freq: Once | INTRAVENOUS | Status: AC
  Administered 2015-01-01: 2000 mL via INTRAVENOUS

## 2015-01-01 NOTE — ED Notes (Signed)
Pt continues to sleep, NAD.

## 2015-01-01 NOTE — ED Notes (Signed)
EKG completed and given to Dr. Christy Gentles

## 2015-01-01 NOTE — ED Provider Notes (Signed)
CSN: [id number]     Arrival date & time 01/01/15  0040 History   First MD Initiated Contact with Patient 01/01/15 0251     Chief Complaint  Patient presents with  . Chest Pain     (Consider location/radiation/quality/duration/timing/severity/associated sxs/prior Treatment) HPI Comments: Patient with complaint of diarrhea since yesterday morning. 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 Diarrhea has been non-bloody. Tonight she started having right sided chest pain that extends to posterior right shoulder and is associated with nausea. She denies vomiting. No fever at any time.   Patient is a 39 y.o. female presenting with chest pain. The history is provided by the patient. No language interpreter was used.  Chest Pain Associated symptoms: abdominal pain and nausea   Associated symptoms: no cough, no fever, no shortness of breath and not vomiting     Past Medical History  Diagnosis Date  . Heart murmur   . Infection     Yeast inf;gets freq w/ antibxs or scented soaps  . Infection     BV;recently treated for BV completed Flagyl  . Anemia     iron supplements in the past  . Ovarian cyst in pregnancy 2013    seen on recent US  . Benign breast cyst in female 1995    had bx done was normal  . DUB (dysfunctional uterine bleeding) 1995  . Asthma     has albuterol, advair prn, no attack x 2 years;triggered by strong smells (bleach and smoke)  . Anxiety     has been prescribed Xanax  . Preeclampsia 1994    induced @ 38 weeks  . Headache(784.0)     during and prior to pregnancy;usually Rt side of head;can effect vision  . 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 Memory loss 2013    Currently having difficult time remembering things  . Scoliosis   . Family hx cleft lip 04/29/2012    FOB's son   . Gestational Hypertension 07/17/2012  . Hx of preeclampsia, prior pregnancy, currently pregnant 03/12/2012    Induced at 38wks, no meds      Past Surgical History  Procedure Laterality Date  . No past surgeries    . Wisdom tooth extraction  05/2011    all 4 removed  . Dilation and evacuation N/A 09/29/2012    Procedure: DILATATION AND EVACUATION;  Surgeon: Mora Bellman, MD;  Location: [facility];  Service: Gynecology;  Laterality: N/A;   Family History  Problem Relation Age of Onset  . Cancer Mother     Throat  . Seizures Mother   . Ulcers Mother   . Pancreatitis Mother   . Cancer Father     Throat  . Hypertension Father   . Thyroid disease Father     Hyperthyoid  . Pancreatitis Father   . COPD Father   . Cirrhosis Father     Liver  . Hypertension Paternal Grandmother   . Heart failure Paternal Grandmother   . Hypertension Paternal Aunt   . Diabetes Maternal Aunt   . Asthma Sister   . Asthma Brother   . Asthma Cousin   . Asthma Paternal Aunt   . Stroke Maternal Uncle   . Stroke Paternal Uncle     x 2  . Sickle cell trait Other     Nephew   History  Substance Use Topics  . Smoking status: Current Every Day Smoker --  0.25 packs/day for 10 years    Types: Cigarettes  . Smokeless tobacco: Never Used  . Alcohol Use: No     Comment: Occasionally   OB History    Gravida Para Term Preterm AB TAB SAB Ectopic Multiple Living   3 2 2  0 1 1 0 0 0 2     Review of Systems  Constitutional: Negative for fever and chills.  HENT: Negative.  Negative for congestion.   Respiratory: Negative.  Negative for cough and shortness of breath.   Cardiovascular: Positive for chest pain.  Gastrointestinal: Positive for nausea, abdominal pain and diarrhea. Negative for vomiting.  Musculoskeletal: Negative.   Skin: Negative.   Neurological: Positive for light-headedness.      Allergies  Bee venom and Tomato  Home Medications   Prior to Admission medications   Medication Sig Start Date End Date Taking? Authorizing Provider  albuterol (PROVENTIL HFA;VENTOLIN HFA) 108 (90 BASE) MCG/ACT inhaler Inhale 2 puffs into the  lungs every 4 (four) hours as needed for wheezing. 04/30/12  Yes Donnel Saxon, CNM  budesonide-formoterol [facility]) 160-4.5 MCG/ACT inhaler Take 2 puffs first thing in am and then another 2 puffs about 12 hours later. Patient taking differently: Inhale 2 puffs into the lungs 2 (two) times daily as needed (for shortness of breath).  05/30/12  Yes Tanda Rockers, MD  hydrocortisone-pramoxine (PROCTOFOAM [facility]) rectal foam Place 1 applicator rectally 2 (two) times daily. Patient taking differently: Place 1 applicator rectally 2 (two) times daily as needed for hemorrhoids.  07/07/12  Yes Woodroe Mode, MD  hydrOXYzine (VISTARIL) 50 MG capsule Take 1 capsule (50 mg total) by mouth every 6 (six) hours as needed for itching. 03/06/12  Yes Arville Go, CNM  Multiple Vitamin (MULTIVITAMIN WITH MINERALS) TABS tablet Take 1 tablet by mouth daily.   Yes Historical Provider, MD  triamcinolone (KENALOG) 0.025 % cream Apply 1 application topically daily as needed (For skin allergy to ticks and fleas).   Yes Historical Provider, MD   BP 109/62 mmHg  Pulse 83  Temp(Src) 98.1 F (36.7 C) (Oral)  Resp 28  SpO2 98%  LMP 12/23/2014 (Approximate) Physical Exam  Constitutional: She is oriented to person, place, and time. She appears well-developed and well-nourished. No distress.  HENT:  Head: Normocephalic.  Mouth/Throat: Mucous membranes are dry.  Neck: Normal range of motion. Neck supple.  Cardiovascular: Regular rhythm.  Tachycardia present.   Pulmonary/Chest: Effort normal and breath sounds normal. Tachypnea noted. She has no wheezes. She has no rales.  Abdominal: Soft. Bowel sounds are normal. There is tenderness. There is no rebound and no guarding.  Diffuse abdominal tenderness.   Musculoskeletal: Normal range of motion.  Neurological: She is alert and oriented to person, place, and time.  Skin: Skin is warm and dry. No rash noted.  Psychiatric: She has a normal mood and affect.    ED Course   Procedures (including critical care time) Labs Review Labs Reviewed  BASIC METABOLIC PANEL - Abnormal; Notable for the following:    Potassium 3.3 (*)    Glucose, Bld 124 (*)    BUN <5 (*)    All other components within normal limits  CBC  I-STAT TROPOININ, ED    Imaging Review Dg Chest 2 View  01/01/2015   CLINICAL DATA:  39 year old female with chest pain  EXAM: CHEST  2 VIEW  COMPARISON:  Radiograph dated 03/06/2012  FINDINGS: The heart size and mediastinal contours are within normal limits. Both lungs are clear.  The visualized skeletal structures are unremarkable.  IMPRESSION: No active cardiopulmonary disease.   Electronically Signed   By: Anner Crete M.D.   On: 01/01/2015 02:31     EKG Interpretation   Date/Time:  Saturday January 01 2015 00:56:19 EDT Ventricular Rate:  118 PR Interval:  189 QRS Duration: 96 QT Interval:  371 QTC Calculation: 520 R Axis:   108 Text Interpretation:  Sinus tachycardia Biatrial enlargement Probable  lateral infarct, old Anteroseptal infarct, old Prolonged QT interval  Artifact in lead(s) II III aVR aVL aVF V1 V2 V3 V4 V5 V6 Confirmed by  Christy Gentles  MD, DONALD ([id number]) on 01/01/2015 1:19:33 AM      MDM   Final diagnoses:  None    1. Abdominal pain 2. Diarrhea  The patient feels much better with IV fluids. No vomiting in ED, less nausea. VS improved. The patient denies pleuritic chest pain, SOB, has no hypoxia. Doubt PE. Labs reassuring. [place] negative. Feel she is stable for discharge home. Return precautions discussed.     Charlann Lange, PA-C 01/01/15 [place], MD 01/01/15 [phone]

## 2015-01-01 NOTE — Discharge Instructions (Signed)
Food Choices to Help Relieve Diarrhea When you have diarrhea, the foods you eat and your eating habits are very important. Choosing the right foods and drinks can help relieve diarrhea. Also, because diarrhea can last up to 7 days, you need to replace lost fluids and electrolytes (such as sodium, potassium, and chloride) in order to help prevent dehydration.  WHAT GENERAL GUIDELINES DO I NEED TO FOLLOW?  Slowly drink 1 cup (8 oz) of fluid for each episode of diarrhea. If you are getting enough fluid, your urine will be clear or pale yellow.  Eat starchy foods. Some good choices include white rice, white toast, pasta, low-fiber cereal, baked potatoes (without the skin), saltine crackers, and bagels.  Avoid large servings of any cooked vegetables.  Limit fruit to two servings per day. A serving is  cup or 1 small piece.  Choose foods with less than 2 g of fiber per serving.  Limit fats to less than 8 tsp (38 g) per day.  Avoid fried foods.  Eat foods that have probiotics in them. Probiotics can be found in certain dairy products.  Avoid foods and beverages that may increase the speed at which food moves through the stomach and intestines (gastrointestinal tract). Things to avoid include:  High-fiber foods, such as dried fruit, raw fruits and vegetables, nuts, seeds, and whole grain foods.  Spicy foods and high-fat foods.  Foods and beverages sweetened with high-fructose corn syrup, honey, or sugar alcohols such as xylitol, sorbitol, and mannitol. WHAT FOODS ARE RECOMMENDED? Grains White rice. White, [place], or pita breads (fresh or toasted), including plain rolls, buns, or bagels. White pasta. Saltine, soda, or graham crackers. Pretzels. Low-fiber cereal. Cooked cereals made with water (such as cornmeal, farina, or cream cereals). Plain muffins. Matzo. Melba toast. Zwieback.  Vegetables Potatoes (without the skin). Strained tomato and vegetable juices. Most well-cooked and canned  vegetables without seeds. Tender lettuce. Fruits Cooked or canned applesauce, apricots, cherries, fruit cocktail, grapefruit, peaches, pears, or plums. Fresh bananas, apples without skin, cherries, grapes, cantaloupe, grapefruit, peaches, oranges, or plums.  Meat and Other Protein Products Baked or boiled chicken. Eggs. Tofu. Fish. Seafood. Smooth peanut butter. Ground or well-cooked tender beef, ham, veal, lamb, pork, or poultry.  Dairy Plain yogurt, kefir, and unsweetened liquid yogurt. Lactose-free milk, buttermilk, or soy milk. Plain hard cheese. Beverages Sport drinks. Clear broths. Diluted fruit juices (except prune). Regular, caffeine-free sodas such as ginger ale. Water. Decaffeinated teas. Oral rehydration solutions. Sugar-free beverages not sweetened with sugar alcohols. Other Bouillon, broth, or soups made from recommended foods.  The items listed above may not be a complete list of recommended foods or beverages. Contact your dietitian for more options. WHAT FOODS ARE NOT RECOMMENDED? Grains Whole grain, whole wheat, bran, or rye breads, rolls, pastas, crackers, and cereals. Wild or brown rice. Cereals that contain more than 2 g of fiber per serving. Corn tortillas or taco shells. Cooked or dry oatmeal. Granola. Popcorn. Vegetables Raw vegetables. Cabbage, broccoli, Brussels sprouts, artichokes, baked beans, beet greens, corn, kale, legumes, peas, sweet potatoes, and yams. Potato skins. Cooked spinach and cabbage. Fruits Dried fruit, including raisins and dates. Raw fruits. Stewed or dried prunes. Fresh apples with skin, apricots, mangoes, pears, raspberries, and strawberries.  Meat and Other Protein Products Chunky peanut butter. Nuts and seeds. Beans and lentils. Berniece Salines.  Dairy High-fat cheeses. Milk, chocolate milk, and beverages made with milk, such as milk shakes. Cream. Ice cream. Sweets and Desserts Sweet rolls, doughnuts, and sweet breads. Pancakes  and waffles. Fats and  Oils Butter. Cream sauces. Margarine. Salad oils. Plain salad dressings. Olives. Avocados.  Beverages Caffeinated beverages (such as coffee, tea, soda, or energy drinks). Alcoholic beverages. Fruit juices with pulp. Prune juice. Soft drinks sweetened with high-fructose corn syrup or sugar alcohols. Other Coconut. Hot sauce. Chili powder. Mayonnaise. Gravy. Cream-based or milk-based soups.  The items listed above may not be a complete list of foods and beverages to avoid. Contact your dietitian for more information. WHAT SHOULD I DO IF I BECOME DEHYDRATED? Diarrhea can sometimes lead to dehydration. Signs of dehydration include dark urine and dry mouth and skin. If you think you are dehydrated, you should rehydrate with an oral rehydration solution. These solutions can be purchased at pharmacies, retail stores, or online.  Drink -1 cup (120-240 mL) of oral rehydration solution each time you have an episode of diarrhea. If drinking this amount makes your diarrhea worse, try drinking smaller amounts more often. For example, drink 1-3 tsp (5-15 mL) every 5-10 minutes.  A general rule for staying hydrated is to drink 1-2 L of fluid per day. Talk to your health care provider about the specific amount you should be drinking each day. Drink enough fluids to keep your urine clear or pale yellow. Document Released: 08/18/2003 Document Revised: 06/02/2013 Document Reviewed: 04/20/2013 [facility] Patient Information 2015 [place], [place]. This information is not intended to replace advice given to you by your health care provider. Make sure you discuss any questions you have with your health care provider. Diarrhea Diarrhea is frequent loose and watery bowel movements. It can cause you to feel weak and dehydrated. Dehydration can cause you to become tired and thirsty, have a dry mouth, and have decreased urination that often is dark yellow. Diarrhea is a sign of another problem, most often an infection that will  not last long. In most cases, diarrhea typically lasts 2-3 days. However, it can last longer if it is a sign of something more serious. It is important to treat your diarrhea as directed by your caregiver to lessen or prevent future episodes of diarrhea. CAUSES  Some common causes include:  Gastrointestinal infections caused by viruses, bacteria, or parasites.  Food poisoning or food allergies.  Certain medicines, such as antibiotics, chemotherapy, and laxatives.  Artificial sweeteners and fructose.  Digestive disorders. HOME CARE INSTRUCTIONS  Ensure adequate fluid intake (hydration): Have 1 cup (8 oz) of fluid for each diarrhea episode. Avoid fluids that contain simple sugars or sports drinks, fruit juices, whole milk products, and sodas. Your urine should be clear or pale yellow if you are drinking enough fluids. Hydrate with an oral rehydration solution that you can purchase at pharmacies, retail stores, and online. You can prepare an oral rehydration solution at home by mixing the following ingredients together:   - tsp table salt.   tsp baking soda.   tsp salt substitute containing potassium chloride.  1  tablespoons sugar.  1 L (34 oz) of water.  Certain foods and beverages may increase the speed at which food moves through the gastrointestinal (GI) tract. These foods and beverages should be avoided and include:  Caffeinated and alcoholic beverages.  High-fiber foods, such as raw fruits and vegetables, nuts, seeds, and whole grain breads and cereals.  Foods and beverages sweetened with sugar alcohols, such as xylitol, sorbitol, and mannitol.  Some foods may be well tolerated and may help thicken stool including:  Starchy foods, such as rice, toast, pasta, low-sugar cereal, oatmeal, grits, baked potatoes,  crackers, and bagels.  Bananas.  Applesauce.  Add probiotic-rich foods to help increase healthy bacteria in the GI tract, such as yogurt and fermented milk  products.  Wash your hands well after each diarrhea episode.  Only take over-the-counter or prescription medicines as directed by your caregiver.  Take a warm bath to relieve any burning or pain from frequent diarrhea episodes. SEEK IMMEDIATE MEDICAL CARE IF:   You are unable to keep fluids down.  You have persistent vomiting.  You have blood in your stool, or your stools are black and tarry.  You do not urinate in 6-8 hours, or there is only a small amount of very dark urine.  You have abdominal pain that increases or localizes.  You have weakness, dizziness, confusion, or light-headedness.  You have a severe headache.  Your diarrhea gets worse or does not get better.  You have a fever or persistent symptoms for more than 2-3 days.  You have a fever and your symptoms suddenly get worse. MAKE SURE YOU:   Understand these instructions.  Will watch your condition.  Will get help right away if you are not doing well or get worse. Document Released: 05/18/2002 Document Revised: 10/12/2013 Document Reviewed: 02/03/2012 [facility] Patient Information 2015 [place], [place]. This information is not intended to replace advice given to you by your health care provider. Make sure you discuss any questions you have with your health care provider.

## 2015-01-01 NOTE — ED Notes (Signed)
Pt arrived to the ED with a complaint of chest pain.  Pt states pain is located in the right chest and goes through her to her rib cage and right shoulder pain.  Pt states that she feels as if the pain is a stabbing sensation.  Pt states's he has had diarrhea since the early am yesterday.  Pt states she also has been treated for anxiety previously and felt as if she was having an anxiety attack and was going to loss consciousness.  Pt is tachycardic in triage.

## 2015-01-01 NOTE — ED Provider Notes (Signed)
ED ECG REPORT   Date: 01/01/2015 0308  Rate: 84  Rhythm: normal sinus rhythm  QRS Axis: normal  Intervals: normal  ST/T Wave abnormalities: nonspecific ST changes  Conduction Disutrbances:none    Ripley Fraise, MD 01/01/15 [phone]

## 2015-01-01 NOTE — ED Provider Notes (Signed)
Patient seen/examined in the Emergency Department in conjunction with Midlevel Provider  Patient reports chest pain, abdominal pain and diarrhea Exam : awake/alert, RUQ tenderness on my exam.  Denies pleuritic CP Plan: recommend RUQ [place].  I doubt PE/ACS/Dissection at this time    Ripley Fraise, MD 01/01/15 [phone]

## 2015-05-19 ENCOUNTER — Emergency Department (HOSPITAL_COMMUNITY): Payer: Self-pay

## 2015-05-19 ENCOUNTER — Encounter (HOSPITAL_COMMUNITY): Payer: Self-pay | Admitting: Emergency Medicine

## 2015-05-19 ENCOUNTER — Inpatient Hospital Stay (HOSPITAL_COMMUNITY)
Admission: EM | Admit: 2015-05-19 | Discharge: 2015-05-21 | DRG: 193 | Disposition: A | Discharge status: Home / Self Care | Payer: Self-pay | Attending: Family Medicine | Admitting: Family Medicine

## 2015-05-19 DIAGNOSIS — F419 Anxiety disorder, unspecified: Secondary | ICD-10-CM | POA: Diagnosis present

## 2015-05-19 DIAGNOSIS — J45901 Unspecified asthma with (acute) exacerbation: Secondary | ICD-10-CM | POA: Diagnosis present

## 2015-05-19 DIAGNOSIS — Z79899 Other long term (current) drug therapy: Secondary | ICD-10-CM

## 2015-05-19 DIAGNOSIS — J9601 Acute respiratory failure with hypoxia: Secondary | ICD-10-CM | POA: Diagnosis present

## 2015-05-19 DIAGNOSIS — Z91018 Allergy to other foods: Secondary | ICD-10-CM

## 2015-05-19 DIAGNOSIS — J189 Pneumonia, unspecified organism: Principal | ICD-10-CM | POA: Diagnosis present

## 2015-05-19 DIAGNOSIS — Z91030 Bee allergy status: Secondary | ICD-10-CM

## 2015-05-19 DIAGNOSIS — Z825 Family history of asthma and other chronic lower respiratory diseases: Secondary | ICD-10-CM

## 2015-05-19 DIAGNOSIS — Z823 Family history of stroke: Secondary | ICD-10-CM

## 2015-05-19 DIAGNOSIS — Z8249 Family history of ischemic heart disease and other diseases of the circulatory system: Secondary | ICD-10-CM

## 2015-05-19 DIAGNOSIS — F17210 Nicotine dependence, cigarettes, uncomplicated: Secondary | ICD-10-CM | POA: Diagnosis present

## 2015-05-19 DIAGNOSIS — E876 Hypokalemia: Secondary | ICD-10-CM | POA: Diagnosis present

## 2015-05-19 DIAGNOSIS — R011 Cardiac murmur, unspecified: Secondary | ICD-10-CM | POA: Diagnosis present

## 2015-05-19 DIAGNOSIS — Z23 Encounter for immunization: Secondary | ICD-10-CM

## 2015-05-19 DIAGNOSIS — M419 Scoliosis, unspecified: Secondary | ICD-10-CM | POA: Diagnosis present

## 2015-05-19 DIAGNOSIS — Z833 Family history of diabetes mellitus: Secondary | ICD-10-CM

## 2015-05-19 DIAGNOSIS — D649 Anemia, unspecified: Secondary | ICD-10-CM | POA: Diagnosis present

## 2015-05-19 DIAGNOSIS — Z800 Family history of malignant neoplasm of digestive organs: Secondary | ICD-10-CM

## 2015-05-19 LAB — CBC WITH DIFFERENTIAL/PLATELET
BASOS PCT: 0 %
Basophils Absolute: 0 10*3/uL (ref 0.0–0.1)
Basophils Absolute: 0 10*3/uL (ref 0.0–0.1)
Basophils Relative: 0 %
EOS ABS: 0 10*3/uL (ref 0.0–0.7)
EOS PCT: 0 %
Eosinophils Absolute: 0 10*3/uL (ref 0.0–0.7)
Eosinophils Relative: 0 %
HCT: 33.9 % — ABNORMAL LOW (ref 36.0–46.0)
HEMATOCRIT: 35.2 % — AB (ref 36.0–46.0)
HEMOGLOBIN: 11.9 g/dL — AB (ref 12.0–15.0)
HEMOGLOBIN: 11.3 g/dL — AB (ref 12.0–15.0)
LYMPHS ABS: 0.6 10*3/uL — AB (ref 0.7–4.0)
Lymphocytes Relative: 17 %
Lymphocytes Relative: 5 %
Lymphs Abs: 2.4 10*3/uL (ref 0.7–4.0)
MCH: 29.5 pg (ref 26.0–34.0)
MCH: 29.7 pg (ref 26.0–34.0)
MCHC: 33.8 g/dL (ref 30.0–36.0)
MCHC: 33.3 g/dL (ref 30.0–36.0)
MCV: 87.1 fL (ref 78.0–100.0)
MCV: 89 fL (ref 78.0–100.0)
MONO ABS: 0.2 10*3/uL (ref 0.1–1.0)
MONOS PCT: 8 %
MONOS PCT: 1 %
Monocytes Absolute: 1.1 10*3/uL — ABNORMAL HIGH (ref 0.1–1.0)
NEUTROS ABS: 10.7 10*3/uL — AB (ref 1.7–7.7)
NEUTROS PCT: 75 %
Neutro Abs: 13.1 10*3/uL — ABNORMAL HIGH (ref 1.7–7.7)
Neutrophils Relative %: 94 %
Platelets: 206 10*3/uL (ref 150–400)
Platelets: 213 10*3/uL (ref 150–400)
RBC: 4.04 MIL/uL (ref 3.87–5.11)
RBC: 3.81 MIL/uL — ABNORMAL LOW (ref 3.87–5.11)
RDW: 13 % (ref 11.5–15.5)
RDW: 13.2 % (ref 11.5–15.5)
WBC: 14.2 10*3/uL — ABNORMAL HIGH (ref 4.0–10.5)
WBC: 13.9 10*3/uL — ABNORMAL HIGH (ref 4.0–10.5)

## 2015-05-19 LAB — RETICULOCYTES
RBC.: 3.82 MIL/uL — ABNORMAL LOW (ref 3.87–5.11)
RETIC COUNT ABSOLUTE: 72.6 10*3/uL (ref 19.0–186.0)
Retic Ct Pct: 1.9 % (ref 0.4–3.1)

## 2015-05-19 LAB — PREGNANCY, URINE: Preg Test, Ur: NEGATIVE

## 2015-05-19 LAB — INFLUENZA PANEL BY PCR (TYPE A & B)
H1N1 flu by pcr: NOT DETECTED
INFLBPCR: NEGATIVE
Influenza A By PCR: NEGATIVE

## 2015-05-19 LAB — IRON AND TIBC
IRON: 25 ug/dL — AB (ref 28–170)
Saturation Ratios: 12 % (ref 10.4–31.8)
TIBC: 213 ug/dL — AB (ref 250–450)
UIBC: 188 ug/dL

## 2015-05-19 LAB — BASIC METABOLIC PANEL
Anion gap: 9 (ref 5–15)
BUN: 8 mg/dL (ref 6–20)
CALCIUM: 8.6 mg/dL — AB (ref 8.9–10.3)
CHLORIDE: 104 mmol/L (ref 101–111)
CO2: 23 mmol/L (ref 22–32)
CREATININE: 0.69 mg/dL (ref 0.44–1.00)
GFR calc Af Amer: >60 mL/min (ref >60)
GFR calc non Af Amer: >60 mL/min (ref >60)
GLUCOSE: 122 mg/dL — AB (ref 65–99)
Potassium: 2.7 mmol/L — CL (ref 3.5–5.1)
Sodium: 136 mmol/L (ref 135–145)

## 2015-05-19 LAB — STREP PNEUMONIAE URINARY ANTIGEN: Strep Pneumo Urinary Antigen: NEGATIVE

## 2015-05-19 LAB — COMPREHENSIVE METABOLIC PANEL
ALT: 12 U/L — ABNORMAL LOW (ref 14–54)
AST: 18 U/L (ref 15–41)
Albumin: 3.4 g/dL — ABNORMAL LOW (ref 3.5–5.0)
Alkaline Phosphatase: 64 U/L (ref 38–126)
Anion gap: 9 (ref 5–15)
BILIRUBIN TOTAL: 0.8 mg/dL (ref 0.3–1.2)
BUN: 9 mg/dL (ref 6–20)
CALCIUM: 8.2 mg/dL — AB (ref 8.9–10.3)
CHLORIDE: 107 mmol/L (ref 101–111)
CO2: 20 mmol/L — AB (ref 22–32)
CREATININE: 0.71 mg/dL (ref 0.44–1.00)
GFR calc non Af Amer: >60 mL/min (ref >60)
GLUCOSE: 138 mg/dL — AB (ref 65–99)
Potassium: 4.2 mmol/L (ref 3.5–5.1)
Sodium: 136 mmol/L (ref 135–145)
Total Protein: 6.8 g/dL (ref 6.5–8.1)

## 2015-05-19 LAB — VITAMIN B12: Vitamin B-12: 369 pg/mL (ref 180–914)

## 2015-05-19 LAB — HIV ANTIBODY (ROUTINE TESTING W REFLEX): HIV SCREEN 4TH GENERATION: NONREACTIVE

## 2015-05-19 LAB — RAPID URINE DRUG SCREEN, HOSP PERFORMED
Amphetamines: NOT DETECTED
BENZODIAZEPINES: NOT DETECTED
Barbiturates: NOT DETECTED
COCAINE: NOT DETECTED
Opiates: POSITIVE — AB
TETRAHYDROCANNABINOL: NOT DETECTED

## 2015-05-19 LAB — FERRITIN: FERRITIN: 121 ng/mL (ref 11–307)

## 2015-05-19 LAB — FOLATE: FOLATE: 16.2 ng/mL (ref >5.9)

## 2015-05-19 LAB — D-DIMER, QUANTITATIVE: D-Dimer, Quant: 0.59 ug/mL-FEU — ABNORMAL HIGH (ref 0.00–0.50)

## 2015-05-19 LAB — MAGNESIUM: MAGNESIUM: 1.9 mg/dL (ref 1.7–2.4)

## 2015-05-19 LAB — TROPONIN I

## 2015-05-19 MED ORDER — AZITHROMYCIN 500 MG IV SOLR
500.0000 mg | INTRAVENOUS | Status: DC
  Administered 2015-05-20 (×2): 500 mg via INTRAVENOUS
  Filled 2015-05-19 (×3): qty 500

## 2015-05-19 MED ORDER — IPRATROPIUM BROMIDE 0.02 % IN SOLN
0.5000 mg | Freq: Once | RESPIRATORY_TRACT | Status: AC
  Administered 2015-05-19: 0.5 mg via RESPIRATORY_TRACT
  Filled 2015-05-19: qty 2.5

## 2015-05-19 MED ORDER — ONDANSETRON HCL 4 MG/2ML IJ SOLN: 4.0000 mg | Freq: Four times a day (QID) | INTRAMUSCULAR | Status: DC | PRN

## 2015-05-19 MED ORDER — DEXTROSE 5 % IV SOLN
1.0000 g | INTRAVENOUS | Status: DC
  Administered 2015-05-19 – 2015-05-21 (×3): 1 g via INTRAVENOUS
  Filled 2015-05-19 (×3): qty 10

## 2015-05-19 MED ORDER — ACETAMINOPHEN 650 MG RE SUPP: 650.0000 mg | Freq: Four times a day (QID) | RECTAL | Status: DC | PRN

## 2015-05-19 MED ORDER — INFLUENZA VAC SPLIT QUAD 0.5 ML IM SUSY
0.5000 mL | PREFILLED_SYRINGE | INTRAMUSCULAR | Status: AC
  Administered 2015-05-21: 0.5 mL via INTRAMUSCULAR
  Filled 2015-05-19 (×3): qty 0.5

## 2015-05-19 MED ORDER — ONDANSETRON 8 MG PO TBDP
8.0000 mg | ORAL_TABLET | Freq: Once | ORAL | Status: AC
  Administered 2015-05-19: 8 mg via ORAL
  Filled 2015-05-19: qty 1

## 2015-05-19 MED ORDER — PREDNISONE 20 MG PO TABS
60.0000 mg | ORAL_TABLET | Freq: Once | ORAL | Status: AC
  Administered 2015-05-19: 60 mg via ORAL
  Filled 2015-05-19: qty 3

## 2015-05-19 MED ORDER — ENOXAPARIN SODIUM 40 MG/0.4ML ~~LOC~~ SOLN
40.0000 mg | SUBCUTANEOUS | Status: DC
  Administered 2015-05-19 – 2015-05-21 (×3): 40 mg via SUBCUTANEOUS
  Filled 2015-05-19 (×4): qty 0.4

## 2015-05-19 MED ORDER — NAPROXEN 500 MG PO TABS
500.0000 mg | ORAL_TABLET | Freq: Once | ORAL | Status: AC
  Administered 2015-05-19: 500 mg via ORAL
  Filled 2015-05-19: qty 1

## 2015-05-19 MED ORDER — LEVALBUTEROL HCL 0.63 MG/3ML IN NEBU
0.6300 mg | INHALATION_SOLUTION | Freq: Four times a day (QID) | RESPIRATORY_TRACT | Status: DC
  Administered 2015-05-19 – 2015-05-20 (×4): 0.63 mg via RESPIRATORY_TRACT
  Filled 2015-05-19 (×4): qty 3

## 2015-05-19 MED ORDER — LEVALBUTEROL HCL 0.63 MG/3ML IN NEBU: 0.6300 mg | INHALATION_SOLUTION | Freq: Four times a day (QID) | RESPIRATORY_TRACT | Status: DC | PRN

## 2015-05-19 MED ORDER — BUDESONIDE 0.25 MG/2ML IN SUSP
0.2500 mg | Freq: Two times a day (BID) | RESPIRATORY_TRACT | Status: DC
  Administered 2015-05-19 – 2015-05-21 (×5): 0.25 mg via RESPIRATORY_TRACT
  Filled 2015-05-19 (×5): qty 2

## 2015-05-19 MED ORDER — GUAIFENESIN ER 600 MG PO TB12
1200.0000 mg | ORAL_TABLET | Freq: Two times a day (BID) | ORAL | Status: DC | PRN
  Administered 2015-05-19 – 2015-05-21 (×2): 1200 mg via ORAL
  Filled 2015-05-19 (×2): qty 2

## 2015-05-19 MED ORDER — ACETAMINOPHEN 325 MG PO TABS
650.0000 mg | ORAL_TABLET | Freq: Four times a day (QID) | ORAL | Status: DC | PRN
  Administered 2015-05-19: 650 mg via ORAL
  Filled 2015-05-19: qty 2

## 2015-05-19 MED ORDER — POTASSIUM CHLORIDE CRYS ER 20 MEQ PO TBCR
80.0000 meq | EXTENDED_RELEASE_TABLET | Freq: Once | ORAL | Status: AC
  Administered 2015-05-19: 80 meq via ORAL
  Filled 2015-05-19: qty 4

## 2015-05-19 MED ORDER — ONDANSETRON HCL 4 MG PO TABS: 4.0000 mg | ORAL_TABLET | Freq: Four times a day (QID) | ORAL | Status: DC | PRN

## 2015-05-19 MED ORDER — POTASSIUM CHLORIDE CRYS ER 20 MEQ PO TBCR
40.0000 meq | EXTENDED_RELEASE_TABLET | Freq: Once | ORAL | Status: DC
  Filled 2015-05-19: qty 2

## 2015-05-19 MED ORDER — HYDROCODONE-HOMATROPINE 5-1.5 MG/5ML PO SYRP
5.0000 mL | ORAL_SOLUTION | Freq: Once | ORAL | Status: AC
  Administered 2015-05-19: 5 mL via ORAL
  Filled 2015-05-19: qty 5

## 2015-05-19 MED ORDER — POTASSIUM CHLORIDE 10 MEQ/100ML IV SOLN
10.0000 meq | Freq: Once | INTRAVENOUS | Status: DC
  Filled 2015-05-19: qty 100

## 2015-05-19 MED ORDER — LEVOFLOXACIN 750 MG PO TABS
750.0000 mg | ORAL_TABLET | Freq: Once | ORAL | Status: AC
  Administered 2015-05-19: 750 mg via ORAL
  Filled 2015-05-19: qty 1

## 2015-05-19 MED ORDER — ALBUTEROL SULFATE (2.5 MG/3ML) 0.083% IN NEBU
5.0000 mg | INHALATION_SOLUTION | Freq: Once | RESPIRATORY_TRACT | Status: AC
Start: 2015-05-19 — End: 2015-05-19
  Administered 2015-05-19: 5 mg via RESPIRATORY_TRACT
  Filled 2015-05-19: qty 6

## 2015-05-19 MED ORDER — SODIUM CHLORIDE 0.9 % IV BOLUS (SEPSIS): 1000.0000 mL | Freq: Once | INTRAVENOUS | Status: DC

## 2015-05-19 MED ORDER — SODIUM CHLORIDE 0.9 % IV SOLN
INTRAVENOUS | Status: AC
  Administered 2015-05-20: 02:00:00 via INTRAVENOUS

## 2015-05-19 NOTE — Progress Notes (Signed)
Patient seen and evaluated ealier this AM. Please refer to H&P for details regarding assessment and plan  Gen.: Patient in no acute distress, alert and awake Cardiovascular: S1 and S2 present, no murmurs Pulmonary: Rales present, no wheezes  We'll plan on reassessing next a.m. unless acute medical condition arises require reevaluation.  Continue antibiotics for community-acquired pneumonia  Wean to room air as tolerated.  Anne Olson, [employer]

## 2015-05-19 NOTE — ED Notes (Signed)
PA notified of pt's vital signs.

## 2015-05-19 NOTE — ED Provider Notes (Signed)
CSN: [id number]     Arrival date & time 05/19/15  0011 History   First MD Initiated Contact with Patient 05/19/15 0044     Chief Complaint  Patient presents with  . Cough  . Generalized Body Aches     (Consider location/radiation/quality/duration/timing/severity/associated sxs/prior Treatment) HPI Comments: Patient is a 39 year old female with a history of anemia, asthma, anxiety, and scoliosis who presents to the emergency department for further evaluation of upper respiratory symptoms and cough. Patient reports that she began to feel tired and weak 4 days ago. This became associated with clear rhinorrhea. The following day, patient reports that she began coughing and sneezing. This was followed by onset of a worsening cough productive of thick, yellow/green phlegm. Patient also began to feel an achy pain in her ribs and chest. This is worse on the left side, beneath her left shoulder blade. Patient states that she has been taking Mucinex for symptoms without relief. She has had some chills and nausea with 1 episode of emesis this morning. She has been able to tolerate fluids by mouth since this episode. Patient denies fever or syncope. 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  Patient is a 39 y.o. female presenting with cough. The history is provided by the patient. No language interpreter was used.  Cough Associated symptoms: chills, rhinorrhea and shortness of breath   Associated symptoms: no fever     Past Medical History  Diagnosis Date  . Heart murmur   . Infection     Yeast inf;gets freq w/ antibxs or scented soaps  . Infection     BV;recently treated for BV completed Flagyl  . Anemia     iron supplements in the past  . Ovarian cyst in pregnancy 2013    seen on recent US  . Benign breast cyst in female 1995    had bx done was normal  . DUB (dysfunctional uterine bleeding) 1995  . Asthma      has albuterol, advair prn, no attack x 2 years;triggered by strong smells (bleach and smoke)  . Anxiety     has been prescribed Xanax  . Preeclampsia 1994    induced @ 38 weeks  . Headache(784.0)     during and prior to pregnancy;usually Rt side of head;can effect vision  . 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 Memory loss 2013    Currently having difficult time remembering things  . Scoliosis   . Family hx cleft lip 04/29/2012    FOB's son   . Gestational Hypertension 07/17/2012  . Hx of preeclampsia, prior pregnancy, currently pregnant 03/12/2012    Induced at 38wks, no meds     Past Surgical History  Procedure Laterality Date  . No past surgeries    . Wisdom tooth extraction  05/2011    all 4 removed  . Dilation and evacuation N/A 09/29/2012    Procedure: DILATATION AND EVACUATION;  Surgeon: Mora Bellman, MD;  Location: [facility];  Service: Gynecology;  Laterality: N/A;   Family History  Problem Relation Age of Onset  . Cancer Mother     Throat  . Seizures Mother   . Ulcers Mother   . Pancreatitis Mother   . Cancer Father     Throat  . Hypertension Father   . Thyroid disease Father     Hyperthyoid  .  Pancreatitis Father   . COPD Father   . Cirrhosis Father     Liver  . Hypertension Paternal Grandmother   . Heart failure Paternal Grandmother   . Hypertension Paternal Aunt   . Diabetes Maternal Aunt   . Asthma Sister   . Asthma Brother   . Asthma Cousin   . Asthma Paternal Aunt   . Stroke Maternal Uncle   . Stroke Paternal Uncle     x 2  . Sickle cell trait Other     Nephew   Social History  Substance Use Topics  . Smoking status: Current Every Day Smoker -- 0.25 packs/day for 10 years    Types: Cigarettes  . Smokeless tobacco: Never Used  . Alcohol Use: No     Comment: Occasionally   OB History    Gravida Para Term Preterm AB TAB SAB Ectopic Multiple Living   3 2 2  0 1 1 0 0 0 2      Review of Systems  Constitutional:  Positive for chills and fatigue. Negative for fever.  HENT: Positive for congestion, postnasal drip and rhinorrhea.   Respiratory: Positive for cough and shortness of breath.   Gastrointestinal: Positive for nausea and vomiting.  Neurological: Negative for syncope.  All other systems reviewed and are negative.   Allergies  Bee venom and Tomato  Home Medications   Prior to Admission medications   Medication Sig Start Date End Date Taking? Authorizing Provider  albuterol (PROVENTIL HFA;VENTOLIN HFA) 108 (90 BASE) MCG/ACT inhaler Inhale 2 puffs into the lungs every 4 (four) hours as needed for wheezing. 04/30/12  Yes Donnel Saxon, CNM  budesonide-formoterol [facility]) 160-4.5 MCG/ACT inhaler Take 2 puffs first thing in am and then another 2 puffs about 12 hours later. Patient taking differently: Inhale 2 puffs into the lungs 2 (two) times daily as needed (for shortness of breath).  05/30/12  Yes Tanda Rockers, MD  guaiFENesin (MUCINEX) 600 MG 12 hr tablet Take 1,200 mg by mouth 2 (two) times daily as needed for cough.   Yes Historical Provider, MD  Multiple Vitamin (MULTIVITAMIN WITH MINERALS) TABS tablet Take 1 tablet by mouth daily.   Yes Historical Provider, MD  hydrocortisone-pramoxine (PROCTOFOAM HC) rectal foam Place 1 applicator rectally 2 (two) times daily. Patient not taking: Reported on 05/19/2015 07/07/12   Woodroe Mode, MD  hydrOXYzine (VISTARIL) 50 MG capsule Take 1 capsule (50 mg total) by mouth every 6 (six) hours as needed for itching. Patient not taking: Reported on 05/19/2015 03/06/12   Arville Go, CNM   BP 121/70 mmHg  Pulse 123  Temp(Src) 98.1 F (36.7 C) (Oral)  Resp 18  Ht 5' 8"  (1.727 m)  Wt 58.968 kg  BMI 19.77 kg/m2  SpO2 97%  LMP 05/19/2015   Physical Exam  Constitutional: She is oriented to person, place, and time. She appears well-developed and well-nourished. No distress.  Patient appears fatigued, but nontoxic.  HENT:  Head: Normocephalic and  atraumatic.  Mouth/Throat: Oropharynx is clear and moist. No oropharyngeal exudate.  Eyes: Conjunctivae and EOM are normal. No scleral icterus.  Neck: Normal range of motion.  No nuchal rigidity or meningismus  Cardiovascular: Normal rate, regular rhythm and intact distal pulses.   Pulmonary/Chest: Tachypnea noted. No respiratory distress. She has no wheezes. She has no rales.  Mild dyspnea with tachypnea. Patient has faint crackles appreciated on expiration in the left upper and mid lung field. Patient speaking in slightly truncated sentences. Chest expansion symmetric.  Musculoskeletal:  Normal range of motion.  Neurological: She is alert and oriented to person, place, and time. She exhibits normal muscle tone. Coordination normal.  GCS 15. Patient moving all extremities.  Skin: Skin is warm and dry. No rash noted. She is not diaphoretic. No erythema. No pallor.  Psychiatric: She has a normal mood and affect. Her behavior is normal.  Nursing note and vitals reviewed.   ED Course  Procedures (including critical care time) Labs Review Labs Reviewed  CBC WITH DIFFERENTIAL/PLATELET - Abnormal; Notable for the following:    WBC 14.2 (*)    Hemoglobin 11.9 (*)    HCT 35.2 (*)    Neutro Abs 10.7 (*)    Monocytes Absolute 1.1 (*)    All other components within normal limits  BASIC METABOLIC PANEL - Abnormal; Notable for the following:    Potassium 2.7 (*)    Glucose, Bld 122 (*)    Calcium 8.6 (*)    All other components within normal limits  URINE RAPID DRUG SCREEN, HOSP PERFORMED  PREGNANCY, URINE    Imaging Review Dg Chest 2 View  05/19/2015  CLINICAL DATA:  39 year old female with cough EXAM: CHEST  2 VIEW COMPARISON:  Radiograph dated 01/01/2015 FINDINGS: There is an area of consolidation with air bronchograms involving the left lung base concerning for pneumonia. Clinical correlation and follow-up recommended. A small left pleural effusion is present. There is mild elevation of  the left hemidiaphragm. The right lung is clear. The cardiac silhouette is within normal limits. The osseous structures appears unremarkable. IMPRESSION: Left lung base consolidation concerning for pneumonia. Clinical correlation and follow-up to resolution recommended. Electronically Signed   By: Anner Crete M.D.   On: 05/19/2015 01:44   I have personally reviewed and evaluated these images and lab results as part of my medical decision-making.   EKG Interpretation None       Medications  sodium chloride 0.9 % bolus 1,000 mL (1,000 mLs Intravenous Transfusing/Transfer 05/19/15 0454)  potassium chloride 10 mEq in 100 mL IVPB (10 mEq Intravenous Transfusing/Transfer 05/19/15 0454)  predniSONE (DELTASONE) tablet 60 mg (60 mg Oral Given 05/19/15 0146)  albuterol (PROVENTIL) (2.5 MG/3ML) 0.083% nebulizer solution 5 mg (5 mg Nebulization Given 05/19/15 0146)  ipratropium (ATROVENT) nebulizer solution 0.5 mg (0.5 mg Nebulization Given 05/19/15 0146)  ondansetron (ZOFRAN-ODT) disintegrating tablet 8 mg (8 mg Oral Given 05/19/15 0145)  levofloxacin (LEVAQUIN) tablet 750 mg (750 mg Oral Given 05/19/15 0238)  HYDROcodone-homatropine (HYCODAN) 5-1.5 MG/5ML syrup 5 mL (5 mLs Oral Given 05/19/15 0335)  naproxen (NAPROSYN) tablet 500 mg (500 mg Oral Given 05/19/15 0335)  potassium chloride SA (K-DUR,KLOR-CON) CR tablet 80 mEq (80 mEq Oral Given 05/19/15 0440)    MDM   Final diagnoses:  CAP (community acquired pneumonia)  Hypokalemia    39 year old female presents to the emergency department for upper respiratory symptoms and cough with chills. She is complaining of pleuritic pain on the left side of her chest. X-ray concerning for pneumonia. This is consistent with her upper respiratory symptoms. Patient has a leukocytosis of 14.2. She is also noted to be persistently tachycardic as well as tachypneic. Albuterol nebulizer given for symptom management. Patient has also been started on Levaquin. Metabolic  panel significant for hypokalemia of 2.7. Suspect that this may be due to patient's frequent albuterol inhaler use. She will be repleted in the emergency department with oral and IV potassium.  Patient is admitted to the hospitalist service for further monitoring and management of her pneumonia. Dr.  Kakrakandy to evaluate for admission.   Filed Vitals:   05/19/15 0016 05/19/15 0103 05/19/15 0240 05/19/15 0333  BP: 170/82  124/72 121/70  Pulse: 105 130 128 123  Temp: 97.6 F (36.4 C)  97.7 F (36.5 C) 98.1 F (36.7 C)  TempSrc: Oral  Oral Oral  Resp: 18  18 18   Height: 5' 8"  (1.727 m)     Weight: 58.968 kg     SpO2: 98% 100% 96% 97%     Antonietta Breach, PA-C 05/19/15 0502  Everlene Balls, MD 05/19/15 [phone]

## 2015-05-19 NOTE — ED Notes (Signed)
Pt is c/o cough productive yellow/green in color, back and chest hurts with cough, runny nose and general body aches  Pt states sxs started on Sunday night  Pt states she gets short of breath on exertion  Pt states she had chills last night  Pt states she had vomiting this morning and has not eaten or drank much today because of the nausea

## 2015-05-19 NOTE — Progress Notes (Signed)
PHARMACY NOTE -  ANTIBIOTIC RENAL DOSE ADJUSTMENT    Request received for Pharmacy to assist with antibiotic renal dose adjustment.   Patient has been initiated on Azithromycin and Ceftriaxone for CAP.  Current dosage is appropriate and neither medication requires renal adjustment  Will sign off at this time.  Please reconsult if a change in clinical status warrants re-evaluation of dosage.  Reuel Boom, PharmD, BCPS Pager: [phone] 05/19/2015, 7:13 AM

## 2015-05-19 NOTE — H&P (Signed)
[facility] History and Physical  Charlina Dwight HQI:[id number] DOB: September 20, 1975 DOA: 05/19/2015  Referring physician: Ms. Deborah Chalk. PCP: Pcp Not In System  Specialists: None.  Chief Complaint: Shortness of breath.  HPI: Anne Olson is a 39 y.o. female with history of bronchial asthma, ongoing tobacco abuse presents to the ER because of worsening shortness of breath. Patient has been having shortness of breath with wheezing and productive cough over the last 3 days. Patient works in a nursing home. In addition patient has been a left-sided pleuritic chest pain. In the ER chest x-ray shows left lower lobe opacity concerning for pneumonia. Patient's blood work also shows leukocytosis and patient was tachycardic. Patient is being admitted for acute respiratory failure secondary to pneumonia. Patient breathing improved with nebulizer treatment. On exam patient is presently not wheezing.   Review of Systems: As presented in the history of presenting illness, rest negative.  Past Medical History  Diagnosis Date  . Heart murmur   . Infection     Yeast inf;gets freq w/ antibxs or scented soaps  . Infection     BV;recently treated for BV completed Flagyl  . Anemia     iron supplements in the past  . Ovarian cyst in pregnancy 2013    seen on recent US  . Benign breast cyst in female 1995    had bx done was normal  . DUB (dysfunctional uterine bleeding) 1995  . Asthma     has albuterol, advair prn, no attack x 2 years;triggered by strong smells (bleach and smoke)  . Anxiety     has been prescribed Xanax  . Preeclampsia 1994    induced @ 38 weeks  . Headache(784.0)     during and prior to pregnancy;usually Rt side of head;can effect vision  . 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 Memory loss 2013    Currently having difficult time remembering things  . Scoliosis   . Family hx cleft lip 04/29/2012    FOB's son   . Gestational Hypertension 07/17/2012  . Hx of  preeclampsia, prior pregnancy, currently pregnant 03/12/2012    Induced at 38wks, no meds     Past Surgical History  Procedure Laterality Date  . No past surgeries    . Wisdom tooth extraction  05/2011    all 4 removed  . Dilation and evacuation N/A 09/29/2012    Procedure: DILATATION AND EVACUATION;  Surgeon: Mora Bellman, MD;  Location: [facility];  Service: Gynecology;  Laterality: N/A;   Social History:  reports that she has been smoking Cigarettes.  She has a 2.5 pack-year smoking history. She has never used smokeless tobacco. She reports that she does not drink alcohol or use illicit drugs. Where does patient live home. Can patient participate in ADLs? Yes.  Allergies  Allergen Reactions  . Bee Venom Anaphylaxis and Swelling    Throat swells  . Tomato Anaphylaxis and Hives    & throat swelling    Family History:  Family History  Problem Relation Age of Onset  . Cancer Mother     Throat  . Seizures Mother   . Ulcers Mother   . Pancreatitis Mother   . Cancer Father     Throat  . Hypertension Father   . Thyroid disease Father     Hyperthyoid  . Pancreatitis Father   . COPD Father   . Cirrhosis Father     Liver  . Hypertension Paternal Grandmother   .  Heart failure Paternal Grandmother   . Hypertension Paternal Aunt   . Diabetes Maternal Aunt   . Asthma Sister   . Asthma Brother   . Asthma Cousin   . Asthma Paternal Aunt   . Stroke Maternal Uncle   . Stroke Paternal Uncle     x 2  . Sickle cell trait Other     Nephew      Prior to Admission medications   Medication Sig Start Date End Date Taking? Authorizing Provider  albuterol (PROVENTIL HFA;VENTOLIN HFA) 108 (90 BASE) MCG/ACT inhaler Inhale 2 puffs into the lungs every 4 (four) hours as needed for wheezing. 04/30/12  Yes Donnel Saxon, CNM  budesonide-formoterol [facility]) 160-4.5 MCG/ACT inhaler Take 2 puffs first thing in am and then another 2 puffs about 12 hours later. Patient taking differently: Inhale  2 puffs into the lungs 2 (two) times daily as needed (for shortness of breath).  05/30/12  Yes Tanda Rockers, MD  guaiFENesin (MUCINEX) 600 MG 12 hr tablet Take 1,200 mg by mouth 2 (two) times daily as needed for cough.   Yes Historical Provider, MD  Multiple Vitamin (MULTIVITAMIN WITH MINERALS) TABS tablet Take 1 tablet by mouth daily.   Yes Historical Provider, MD  hydrocortisone-pramoxine (PROCTOFOAM HC) rectal foam Place 1 applicator rectally 2 (two) times daily. Patient not taking: Reported on 05/19/2015 07/07/12   Woodroe Mode, MD  hydrOXYzine (VISTARIL) 50 MG capsule Take 1 capsule (50 mg total) by mouth every 6 (six) hours as needed for itching. Patient not taking: Reported on 05/19/2015 03/06/12   Arville Go, CNM    Physical Exam: Filed Vitals:   05/19/15 0103 05/19/15 0240 05/19/15 0333 05/19/15 0528  BP:  124/72 121/70 116/70  Pulse: 130 128 123 101  Temp:  97.7 F (36.5 C) 98.1 F (36.7 C) 97.7 F (36.5 C)  TempSrc:  Oral Oral Oral  Resp:  18 18 19   Height:      Weight:      SpO2: 100% 96% 97% 100%     General:  Moderately built and poorly nourished.  Eyes: Anicteric pallor.  ENT: No discharge from the ears eyes nose or mouth.  Neck: No mass felt. No JVD appreciated.  Cardiovascular: S1-S2 heard tachycardic.  Respiratory: No rhonchi or crepitations.  Abdomen: Soft nontender bowel sounds present.  Skin: No rash.  Musculoskeletal: No edema.  Psychiatric: Appears normal.  Neurologic: Alert awake oriented to time place and person. Moves all extremities.  Labs on Admission:  Basic Metabolic Panel:  Recent Labs Lab 05/19/15 0309  NA 136  K 2.7*  CL 104  CO2 23  GLUCOSE 122*  BUN 8  CREATININE 0.69  CALCIUM 8.6*   Liver Function Tests: No results for input(s): AST, ALT, ALKPHOS, BILITOT, PROT, ALBUMIN in the last 168 hours. No results for input(s): LIPASE, AMYLASE in the last 168 hours. No results for input(s): AMMONIA in the last 168  hours. CBC:  Recent Labs Lab 05/19/15 0309  WBC 14.2*  NEUTROABS 10.7*  HGB 11.9*  HCT 35.2*  MCV 87.1  PLT 206   Cardiac Enzymes: No results for input(s): CKTOTAL, CKMB, CKMBINDEX, TROPONINI in the last 168 hours.  BNP (last 3 results) No results for input(s): BNP in the last 8760 hours.  ProBNP (last 3 results) No results for input(s): PROBNP in the last 8760 hours.  CBG: No results for input(s): GLUCAP in the last 168 hours.  Radiological Exams on Admission: Dg Chest 2 View  05/19/2015  CLINICAL DATA:  39 year old female with cough EXAM: CHEST  2 VIEW COMPARISON:  Radiograph dated 01/01/2015 FINDINGS: There is an area of consolidation with air bronchograms involving the left lung base concerning for pneumonia. Clinical correlation and follow-up recommended. A small left pleural effusion is present. There is mild elevation of the left hemidiaphragm. The right lung is clear. The cardiac silhouette is within normal limits. The osseous structures appears unremarkable. IMPRESSION: Left lung base consolidation concerning for pneumonia. Clinical correlation and follow-up to resolution recommended. Electronically Signed   By: Anner Crete M.D.   On: 05/19/2015 01:44     Assessment/Plan Principal Problem:   Acute respiratory failure with hypoxia (HCC) Active Problems:   CAP (community acquired pneumonia)   Hypokalemia   Normocytic normochromic anemia   Pneumonia   1. Acute hypoxemic respiratory failure secondary to pneumonia - patient has been placed on ceftriaxone and Zithromax for community-acquired pneumonia. Check influenza PCR HIV status urine Legionella and strep antigen. Continue with gentle hydration. Will check d-dimer secondary to significant tachycardia. 2. Hypokalemia - patient did have one episode of diarrhea last night. Patient has not been on any antibiotics recently. Replace and recheck potassium check magnesium levels. 3. Asthma - presently not wheezing.  Continue with nebulizer treatment since patient has tachycardic I have placed patient on Xopenex and Pulmicort. 4. Tobacco abuse - tobacco cessation counseling requested. 5. Normocytic normochromic anemia - follow anemia panel and CBC.  Patient's pregnancy screen is pending.   DVT Prophylaxis Lovenox.  Code Status: Full code.  Family Communication: Discussed with patient.  Disposition Plan: Admit to inpatient.    Autumn Gunn N. [facility] Pager [phone].  If 7PM-7AM, please contact night-coverage [email or web address] Password [email or web address] 05/19/2015, 7:01 AM

## 2015-05-19 NOTE — Care Management Note (Signed)
Case Management Note  Patient Details  Name: Anne Olson MRN: [id number] Date of Birth: 05/14/1976  Subjective/Objective:                 capna in patient with history of asthma and failed outpt treatment   Action/Plan:Date: May 19, 2015 Chart reviewed for concurrent status and case management needs. Will continue to follow patient for changes and needs: Velva Harman, RN, BSN, [place]   [phone]   Expected Discharge Date:                  Expected Discharge Plan:  Home/Self Care  In-House Referral:  NA  Discharge planning Services  CM Consult  Post Acute Care Choice:  NA Choice offered to:  NA  DME Arranged:  N/A DME Agency:  NA  HH Arranged:  NA HH Agency:  NA  Status of Service:  In process, will continue to follow  Medicare Important Message Given:    Date Medicare IM Given:    Medicare IM give by:    Date Additional Medicare IM Given:    Additional Medicare Important Message give by:     If discussed at [place] of Stay Meetings, dates discussed:    Additional Comments:  Leeroy Cha, RN 05/19/2015, 9:36 AM

## 2015-05-19 NOTE — ED Notes (Signed)
Pt reports improvement in breathing following neb treatment. No acute distress noted.

## 2015-05-19 NOTE — ED Notes (Signed)
Patient requesting coffee, states she is cold and her throat is very sore and the warm drink will help. Patient was explained she could have decaf coffee because of her elevated heart rate and concern the caffeine could cause it to elevate further. Patient agreed. Patient was given decaf coffee, bedside table within reach. Denies additional needs at this time.

## 2015-05-19 NOTE — ED Notes (Signed)
Pt coughing up pink-tinged sputum in the presence of this RN.  Reports sweats last night, fever unknown. PA notified.

## 2015-05-20 LAB — LEGIONELLA ANTIGEN, URINE

## 2015-05-20 MED ORDER — PREDNISONE 50 MG PO TABS
50.0000 mg | ORAL_TABLET | Freq: Every day | ORAL | Status: DC
  Administered 2015-05-20 – 2015-05-21 (×2): 50 mg via ORAL
  Filled 2015-05-20 (×3): qty 1

## 2015-05-20 MED ORDER — LEVALBUTEROL HCL 0.63 MG/3ML IN NEBU
0.6300 mg | INHALATION_SOLUTION | Freq: Three times a day (TID) | RESPIRATORY_TRACT | Status: DC
  Administered 2015-05-20 – 2015-05-21 (×4): 0.63 mg via RESPIRATORY_TRACT
  Filled 2015-05-20 (×4): qty 3

## 2015-05-20 MED ORDER — LEVALBUTEROL HCL 0.63 MG/3ML IN NEBU: 0.6300 mg | INHALATION_SOLUTION | RESPIRATORY_TRACT | Status: DC | PRN

## 2015-05-20 MED ORDER — PHENOL 1.4 % MT LIQD
1.0000 | OROMUCOSAL | Status: DC | PRN
  Administered 2015-05-20: 1 via OROMUCOSAL
  Filled 2015-05-20: qty 177

## 2015-05-20 NOTE — Progress Notes (Signed)
[facility] PROGRESS NOTE  Anne Olson ZMO:[id number] DOB: 20-Mar-1976 DOA: 05/19/2015 PCP: Pcp Not In System  Assessment/Plan: Principal Problem:   Acute respiratory failure with hypoxia ([place]) - We'll add prednisone, continue pulmicort - Otherwise continue current medical management supplemental oxygen as needed. Will wean to room air  Active Problems:   CAP (community acquired pneumonia) - Recommend increasing activity - Patient on azithromycin and ceftriaxone    Hypokalemia - Resolved, currently stable    Normocytic normochromic anemia - stable  Code Status: full Family Communication: discussed directly with patient  Disposition Plan: Barriers to discharge continued requirement of supplemental oxygen in lieu of CAP.    Consultants:  None  Procedures:  None  Antibiotics:  Azithromycin  Rocephin  HPI/Subjective: Pt has no new complaints. No acute issues overnight. Breathing is about the same currently  Objective: Filed Vitals:   05/20/15 0549 05/20/15 1412  BP: 105/63 112/66  Pulse: 64 79  Temp: 97.9 F (36.6 C) 98 F (36.7 C)  Resp: 16 18   No intake or output data in the 24 hours ending 05/20/15 1658 Filed Weights   05/19/15 0016  Weight: 58.968 kg (130 lb)    Exam:   General:  Pt in nad, alert and awake  Cardiovascular: rrr, no mrg  Respiratory: rhales, no wheezes, equal chest rise, [place] in place.  Abdomen: soft, NT, ND  Musculoskeletal: no cyanosis or clubbing   Data Reviewed: Basic Metabolic Panel:  Recent Labs Lab 05/19/15 0309 05/19/15 0809  NA 136 136  K 2.7* 4.2  CL 104 107  CO2 23 20*  GLUCOSE 122* 138*  BUN 8 9  CREATININE 0.69 0.71  CALCIUM 8.6* 8.2*  MG  --  1.9   Liver Function Tests:  Recent Labs Lab 05/19/15 0809  AST 18  ALT 12*  ALKPHOS 64  BILITOT 0.8  PROT 6.8  ALBUMIN 3.4*   No results for input(s): LIPASE, AMYLASE in the last 168 hours. No results for input(s): AMMONIA in the last 168  hours. CBC:  Recent Labs Lab 05/19/15 0309 05/19/15 0809  WBC 14.2* 13.9*  NEUTROABS 10.7* 13.1*  HGB 11.9* 11.3*  HCT 35.2* 33.9*  MCV 87.1 89.0  PLT 206 213   Cardiac Enzymes:  Recent Labs Lab 05/19/15 0800  TROPONINI <0.03   BNP (last 3 results) No results for input(s): BNP in the last 8760 hours.  ProBNP (last 3 results) No results for input(s): PROBNP in the last 8760 hours.  CBG: No results for input(s): GLUCAP in the last 168 hours.  Recent Results (from the past 240 hour(s))  Culture, blood (routine x 2) Call MD if unable to obtain prior to antibiotics being given     Status: None (Preliminary result)   Collection Time: 05/19/15  8:00 AM  Result Value Ref Range Status   Specimen Description BLOOD RIGHT ARM  Final   Special Requests BOTTLES DRAWN AEROBIC AND ANAEROBIC 5CC  Final   Culture   Final    NO GROWTH 1 DAY Performed at [facility]    Report Status PENDING  Incomplete  Culture, blood (routine x 2) Call MD if unable to obtain prior to antibiotics being given     Status: None (Preliminary result)   Collection Time: 05/19/15  8:09 AM  Result Value Ref Range Status   Specimen Description BLOOD RIGHT HAND  Final   Special Requests BOTTLES DRAWN AEROBIC AND ANAEROBIC 5CC  Final   Culture   Final  NO GROWTH 1 DAY Performed at [facility]    Report Status PENDING  Incomplete     Studies: Dg Chest 2 View  05/19/2015  CLINICAL DATA:  39 year old female with cough EXAM: CHEST  2 VIEW COMPARISON:  Radiograph dated 01/01/2015 FINDINGS: There is an area of consolidation with air bronchograms involving the left lung base concerning for pneumonia. Clinical correlation and follow-up recommended. A small left pleural effusion is present. There is mild elevation of the left hemidiaphragm. The right lung is clear. The cardiac silhouette is within normal limits. The osseous structures appears unremarkable. IMPRESSION: Left lung base consolidation  concerning for pneumonia. Clinical correlation and follow-up to resolution recommended. Electronically Signed   By: Anner Crete M.D.   On: 05/19/2015 01:44    Scheduled Meds: . azithromycin  500 mg Intravenous Q24H  . budesonide (PULMICORT) nebulizer solution  0.25 mg Nebulization BID  . cefTRIAXone (ROCEPHIN)  IV  1 g Intravenous Q24H  . enoxaparin (LOVENOX) injection  40 mg Subcutaneous Q24H  . Influenza vac split quadrivalent PF  0.5 mL Intramuscular Tomorrow-1000  . levalbuterol  0.63 mg Nebulization TID   Continuous Infusions:   Time spent: > 35 minutes   Velvet Bathe  [facility] Pager [phone] If 7PM-7AM, please contact night-coverage at [email or web address], password [facility] 05/20/2015, 4:58 PM  LOS: 1 day

## 2015-05-21 MED ORDER — PREDNISONE 50 MG PO TABS: ORAL_TABLET | ORAL | Status: DC

## 2015-05-21 MED ORDER — AZITHROMYCIN 250 MG PO TABS: ORAL_TABLET | ORAL | Status: DC

## 2015-05-21 MED ORDER — CEFDINIR 300 MG PO CAPS: 300.0000 mg | ORAL_CAPSULE | Freq: Two times a day (BID) | ORAL | Status: DC

## 2015-05-21 NOTE — Discharge Summary (Signed)
Physician Discharge Summary  Anne Olson EUM:[id number] DOB: 07/14/1975 DOA: 05/19/2015  PCP: Pcp Not In System  Admit date: 05/19/2015 Discharge date: 05/21/2015  Time spent: > 35 minutes  Recommendations for Outpatient Follow-up:  1. Monitor cbc and potassium levels 2. Also assess glucose levels, consider obtaining hgba1c   Discharge Diagnoses:  Principal Problem:   Acute respiratory failure with hypoxia ([place]) Active Problems:   CAP (community acquired pneumonia)   Hypokalemia   Normocytic normochromic anemia   Pneumonia   Discharge Condition: stable  Diet recommendation: regular diet  Filed Weights   05/19/15 0016  Weight: 58.968 kg (130 lb)    History of present illness:  39 y/o with history of asthma that presented with hypoxia, pna, and asthma exacerbation  Hospital Course:  Asthma exacerbation - improved with bronchodilators, pulmicort, and antibiotics - weaned successfully to room air.  Hypoxia/acute respiratory failure - resolved with measures listed above  Procedures:  None  Consultations:  None  Discharge Exam: Filed Vitals:   05/20/15 2218 05/21/15 0655  BP: 133/79 121/67  Pulse: 76 56  Temp: 97.9 F (36.6 C) 97.9 F (36.6 C)  Resp: 18 18    General: Pt in nad, alert and awake Cardiovascular: rrr, no mrg Respiratory: cta bl, no wheezes  Discharge Instructions   Discharge Instructions    Call MD for:  difficulty breathing, headache or visual disturbances    Complete by:  As directed      Call MD for:  redness, tenderness, or signs of infection (pain, swelling, redness, odor or green/yellow discharge around incision site)    Complete by:  As directed      Call MD for:  temperature >100.4    Complete by:  As directed      Diet - low sodium heart healthy    Complete by:  As directed      Discharge instructions    Complete by:  As directed   Pt has no new complaints no acute issues reported overnight.     Increase activity  slowly    Complete by:  As directed           Current Discharge Medication List    START taking these medications   Details  azithromycin (ZITHROMAX) 250 MG tablet Take 1 tab by mouth daily for the next 4 days Qty: 4 each, Refills: 0    cefdinir (OMNICEF) 300 MG capsule Take 1 capsule (300 mg total) by mouth 2 (two) times daily. Qty: 8 capsule, Refills: 0    predniSONE (DELTASONE) 50 MG tablet Take 1 tab by mouth daily for the next 4 days Qty: 4 tablet, Refills: 0      CONTINUE these medications which have NOT CHANGED   Details  albuterol (PROVENTIL HFA;VENTOLIN HFA) 108 (90 BASE) MCG/ACT inhaler Inhale 2 puffs into the lungs every 4 (four) hours as needed for wheezing. Qty: 1 Inhaler, Refills: 2   Associated Diagnoses: Asthma    budesonide-formoterol (SYMBICORT) 160-4.5 MCG/ACT inhaler Take 2 puffs first thing in am and then another 2 puffs about 12 hours later. Qty: 1 Inhaler, Refills: 12    guaiFENesin (MUCINEX) 600 MG 12 hr tablet Take 1,200 mg by mouth 2 (two) times daily as needed for cough.    Multiple Vitamin (MULTIVITAMIN WITH MINERALS) TABS tablet Take 1 tablet by mouth daily.    hydrOXYzine (VISTARIL) 50 MG capsule Take 1 capsule (50 mg total) by mouth every 6 (six) hours as needed for itching. Qty: 30 capsule, Refills:  0      STOP taking these medications     hydrocortisone-pramoxine (PROCTOFOAM HC) rectal foam        Allergies  Allergen Reactions  . Bee Venom Anaphylaxis and Swelling    Throat swells  . Tomato Anaphylaxis and Hives    & throat swelling      The results of significant diagnostics from this hospitalization (including imaging, microbiology, ancillary and laboratory) are listed below for reference.    Significant Diagnostic Studies: Dg Chest 2 View  05/19/2015  CLINICAL DATA:  39 year old female with cough EXAM: CHEST  2 VIEW COMPARISON:  Radiograph dated 01/01/2015 FINDINGS: There is an area of consolidation with air bronchograms  involving the left lung base concerning for pneumonia. Clinical correlation and follow-up recommended. A small left pleural effusion is present. There is mild elevation of the left hemidiaphragm. The right lung is clear. The cardiac silhouette is within normal limits. The osseous structures appears unremarkable. IMPRESSION: Left lung base consolidation concerning for pneumonia. Clinical correlation and follow-up to resolution recommended. Electronically Signed   By: Anner Crete M.D.   On: 05/19/2015 01:44    Microbiology: Recent Results (from the past 240 hour(s))  Culture, blood (routine x 2) Call MD if unable to obtain prior to antibiotics being given     Status: None (Preliminary result)   Collection Time: 05/19/15  8:00 AM  Result Value Ref Range Status   Specimen Description BLOOD RIGHT ARM  Final   Special Requests BOTTLES DRAWN AEROBIC AND ANAEROBIC 5CC  Final   Culture   Final    NO GROWTH 2 DAYS Performed at [facility]    Report Status PENDING  Incomplete  Culture, blood (routine x 2) Call MD if unable to obtain prior to antibiotics being given     Status: None (Preliminary result)   Collection Time: 05/19/15  8:09 AM  Result Value Ref Range Status   Specimen Description BLOOD RIGHT HAND  Final   Special Requests BOTTLES DRAWN AEROBIC AND ANAEROBIC 5CC  Final   Culture   Final    NO GROWTH 2 DAYS Performed at [facility]    Report Status PENDING  Incomplete     Labs: Basic Metabolic Panel:  Recent Labs Lab 05/19/15 0309 05/19/15 0809  NA 136 136  K 2.7* 4.2  CL 104 107  CO2 23 20*  GLUCOSE 122* 138*  BUN 8 9  CREATININE 0.69 0.71  CALCIUM 8.6* 8.2*  MG  --  1.9   Liver Function Tests:  Recent Labs Lab 05/19/15 0809  AST 18  ALT 12*  ALKPHOS 64  BILITOT 0.8  PROT 6.8  ALBUMIN 3.4*   No results for input(s): LIPASE, AMYLASE in the last 168 hours. No results for input(s): AMMONIA in the last 168 hours. CBC:  Recent Labs Lab  05/19/15 0309 05/19/15 0809  WBC 14.2* 13.9*  NEUTROABS 10.7* 13.1*  HGB 11.9* 11.3*  HCT 35.2* 33.9*  MCV 87.1 89.0  PLT 206 213   Cardiac Enzymes:  Recent Labs Lab 05/19/15 0800  TROPONINI <0.03   BNP: BNP (last 3 results) No results for input(s): BNP in the last 8760 hours.  ProBNP (last 3 results) No results for input(s): PROBNP in the last 8760 hours.  CBG: No results for input(s): GLUCAP in the last 168 hours.   Signed:  Velvet Bathe  [facility] 05/21/2015, 1:25 PM

## 2015-05-21 NOTE — Progress Notes (Signed)
Patient reports pain and itching when receiving IV zithromax. Pain and itching resolves after flushing and only NS running. Zithromax infusion stopped.

## 2015-05-21 NOTE — Progress Notes (Signed)
Patient discharged.  Leaving with three prescriptions and one RTW note from physician.  Room air.  No s/s of pain or distress.  No complaints.  Leaving with personal belongings.  A&O x4.  Respirations even and unlabored.

## 2015-05-24 LAB — CULTURE, BLOOD (ROUTINE X 2)
CULTURE: NO GROWTH
CULTURE: NO GROWTH

## 2015-05-26 ENCOUNTER — Encounter (HOSPITAL_COMMUNITY): Payer: Self-pay | Admitting: Emergency Medicine

## 2015-05-26 ENCOUNTER — Emergency Department (HOSPITAL_COMMUNITY): Payer: Self-pay

## 2015-05-26 ENCOUNTER — Emergency Department (HOSPITAL_COMMUNITY)
Admission: EM | Admit: 2015-05-26 | Discharge: 2015-05-26 | Disposition: A | Discharge status: Home / Self Care | Payer: Self-pay | Attending: Emergency Medicine | Admitting: Emergency Medicine

## 2015-05-26 DIAGNOSIS — F419 Anxiety disorder, unspecified: Secondary | ICD-10-CM | POA: Insufficient documentation

## 2015-05-26 DIAGNOSIS — Z79899 Other long term (current) drug therapy: Secondary | ICD-10-CM | POA: Insufficient documentation

## 2015-05-26 DIAGNOSIS — J45901 Unspecified asthma with (acute) exacerbation: Secondary | ICD-10-CM | POA: Insufficient documentation

## 2015-05-26 DIAGNOSIS — Z8739 Personal history of other diseases of the musculoskeletal system and connective tissue: Secondary | ICD-10-CM | POA: Insufficient documentation

## 2015-05-26 DIAGNOSIS — R600 Localized edema: Secondary | ICD-10-CM | POA: Insufficient documentation

## 2015-05-26 DIAGNOSIS — R109 Unspecified abdominal pain: Secondary | ICD-10-CM | POA: Insufficient documentation

## 2015-05-26 DIAGNOSIS — Z975 Presence of (intrauterine) contraceptive device: Secondary | ICD-10-CM | POA: Insufficient documentation

## 2015-05-26 DIAGNOSIS — R0602 Shortness of breath: Secondary | ICD-10-CM

## 2015-05-26 DIAGNOSIS — Z8619 Personal history of other infectious and parasitic diseases: Secondary | ICD-10-CM | POA: Insufficient documentation

## 2015-05-26 DIAGNOSIS — R011 Cardiac murmur, unspecified: Secondary | ICD-10-CM | POA: Insufficient documentation

## 2015-05-26 DIAGNOSIS — R609 Edema, unspecified: Secondary | ICD-10-CM

## 2015-05-26 DIAGNOSIS — J159 Unspecified bacterial pneumonia: Secondary | ICD-10-CM | POA: Insufficient documentation

## 2015-05-26 DIAGNOSIS — Z8742 Personal history of other diseases of the female genital tract: Secondary | ICD-10-CM | POA: Insufficient documentation

## 2015-05-26 DIAGNOSIS — Z862 Personal history of diseases of the blood and blood-forming organs and certain disorders involving the immune mechanism: Secondary | ICD-10-CM | POA: Insufficient documentation

## 2015-05-26 DIAGNOSIS — J189 Pneumonia, unspecified organism: Secondary | ICD-10-CM

## 2015-05-26 DIAGNOSIS — F17210 Nicotine dependence, cigarettes, uncomplicated: Secondary | ICD-10-CM | POA: Insufficient documentation

## 2015-05-26 DIAGNOSIS — Z3202 Encounter for pregnancy test, result negative: Secondary | ICD-10-CM | POA: Insufficient documentation

## 2015-05-26 LAB — CBC
HCT: 39.7 % (ref 36.0–46.0)
Hemoglobin: 13.2 g/dL (ref 12.0–15.0)
MCH: 29.5 pg (ref 26.0–34.0)
MCHC: 33.2 g/dL (ref 30.0–36.0)
MCV: 88.6 fL (ref 78.0–100.0)
PLATELETS: 361 10*3/uL (ref 150–400)
RBC: 4.48 MIL/uL (ref 3.87–5.11)
RDW: 13.6 % (ref 11.5–15.5)
WBC: 15.4 10*3/uL — ABNORMAL HIGH (ref 4.0–10.5)

## 2015-05-26 LAB — URINALYSIS, ROUTINE W REFLEX MICROSCOPIC
Bilirubin Urine: NEGATIVE
GLUCOSE, UA: NEGATIVE mg/dL
HGB URINE DIPSTICK: NEGATIVE
Ketones, ur: NEGATIVE mg/dL
Leukocytes, UA: NEGATIVE
Nitrite: NEGATIVE
Protein, ur: NEGATIVE mg/dL
SPECIFIC GRAVITY, URINE: 1.006 (ref 1.005–1.030)
pH: 7 (ref 5.0–8.0)

## 2015-05-26 LAB — BASIC METABOLIC PANEL
Anion gap: 10 (ref 5–15)
BUN: 7 mg/dL (ref 6–20)
CHLORIDE: 107 mmol/L (ref 101–111)
CO2: 23 mmol/L (ref 22–32)
CREATININE: 0.7 mg/dL (ref 0.44–1.00)
Calcium: 8.6 mg/dL — ABNORMAL LOW (ref 8.9–10.3)
GFR calc Af Amer: >60 mL/min (ref >60)
GFR calc non Af Amer: >60 mL/min (ref >60)
Glucose, Bld: 89 mg/dL (ref 65–99)
Potassium: 3.2 mmol/L — ABNORMAL LOW (ref 3.5–5.1)
Sodium: 140 mmol/L (ref 135–145)

## 2015-05-26 LAB — LIPASE, BLOOD: Lipase: 31 U/L (ref 11–51)

## 2015-05-26 LAB — I-STAT TROPONIN, ED: Troponin i, poc: 0 ng/mL (ref 0.00–0.08)

## 2015-05-26 LAB — PREGNANCY, URINE: PREG TEST UR: NEGATIVE

## 2015-05-26 MED ORDER — IPRATROPIUM-ALBUTEROL 0.5-2.5 (3) MG/3ML IN SOLN
3.0000 mL | Freq: Once | RESPIRATORY_TRACT | Status: AC
  Administered 2015-05-26: 3 mL via RESPIRATORY_TRACT
  Filled 2015-05-26: qty 3

## 2015-05-26 MED ORDER — HYDROCODONE-ACETAMINOPHEN 5-325 MG PO TABS: 1.0000 | ORAL_TABLET | ORAL | Status: DC | PRN

## 2015-05-26 MED ORDER — BENZONATATE 100 MG PO CAPS: 100.0000 mg | ORAL_CAPSULE | Freq: Three times a day (TID) | ORAL | Status: DC

## 2015-05-26 MED ORDER — ALBUTEROL SULFATE (2.5 MG/3ML) 0.083% IN NEBU
2.5000 mg | INHALATION_SOLUTION | Freq: Once | RESPIRATORY_TRACT | Status: AC
  Administered 2015-05-26: 2.5 mg via RESPIRATORY_TRACT
  Filled 2015-05-26: qty 3

## 2015-05-26 MED ORDER — IOHEXOL 350 MG/ML SOLN
100.0000 mL | Freq: Once | INTRAVENOUS | Status: AC | PRN
  Administered 2015-05-26: 100 mL via INTRAVENOUS

## 2015-05-26 NOTE — ED Provider Notes (Signed)
The patient is a 39 year old female, she was recently admitted to the hospital for pneumonia, she presents today because she is still feeling short of breath andfeeling swollen in her ankles and feet bilaterally, as well as feeling slightly distended in the abdomen. On exam she is mildly tachypneic but has clear lung sounds, no rales or wheezing, speaks in full sentences. Scant bilateral lower extremity edema, no asymmetry, abdomen is soft and nontender, no pitting edema of the abdominal wall, no anasarca.  Chest x-ray reveals shrinking infiltrate  Labs ordered to evaluate for source of swelling though this may be third spacing after admission with hydration.  Medical screening examination/treatment/procedure(s) were conducted as a shared visit with non-physician practitioner(s) and myself.  I personally evaluated the patient during the encounter.  Clinical Impression:   Final diagnoses:  CAP (community acquired pneumonia)  Shortness of breath  Peripheral edema         Noemi Chapel, MD 05/29/15 [phone]

## 2015-05-26 NOTE — ED Notes (Signed)
Unsuccessful IV attempt x 2.  Asked Anne Olson to attempt.

## 2015-05-26 NOTE — ED Notes (Signed)
Patient transported to CT 

## 2015-05-26 NOTE — ED Provider Notes (Signed)
CSN: [id number]     Arrival date & time 05/26/15  1622 History   First MD Initiated Contact with Patient 05/26/15 1635     Chief Complaint  Patient presents with  . Shortness of Breath  . Leg Swelling     (Consider location/radiation/quality/duration/timing/severity/associated sxs/prior Treatment) HPI   Anne Olson is a 39 y.o. female, with a history of anemia and heart murmur, presenting to the ED with complaints of exertional shortness of breath for the past two weeks.  Pt was discharged from [facility] on 12/10, where she was receiving treatment for pneumonia.  Pt went in to her PCP's office today for a follow up and was sent here due to concerns for possible kidney dysfunction. Pt was noted to be hypertensive in the office and since she doesn't have a history of this, she was sent to the emergency room. No history of renal or liver dysfunction. Pt also complains of right flank pain that began two days ago, 4/10, throbbing, non-radiating. Pt adds that she noticed bilateral foot and ankle edema and a sensation of her "heart pounding out of the chest," both since last night. Pt denies N/V/D/C, back pain, chest pain, fever/chills, urinary symptoms, or any other pain or complaints. Pt finished her last doses of azithromycin and cefdinir yesterday.    Past Medical History  Diagnosis Date  . Heart murmur   . Infection     Yeast inf;gets freq w/ antibxs or scented soaps  . Infection     BV;recently treated for BV completed Flagyl  . Anemia     iron supplements in the past  . Ovarian cyst in pregnancy 2013    seen on recent US  . Benign breast cyst in female 1995    had bx done was normal  . DUB (dysfunctional uterine bleeding) 1995  . Asthma     has albuterol, advair prn, no attack x 2 years;triggered by strong smells (bleach and smoke)  . Anxiety     has been prescribed Xanax  . Preeclampsia 1994    induced @ 38 weeks  . Headache(784.0)     during and prior to pregnancy;usually Rt side  of head;can effect vision  . 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 Memory loss 2013    Currently having difficult time remembering things  . Scoliosis   . Family hx cleft lip 04/29/2012    FOB's son   . Gestational Hypertension 07/17/2012  . Hx of preeclampsia, prior pregnancy, currently pregnant 03/12/2012    Induced at 38wks, no meds     Past Surgical History  Procedure Laterality Date  . No past surgeries    . Wisdom tooth extraction  05/2011    all 4 removed  . Dilation and evacuation N/A 09/29/2012    Procedure: DILATATION AND EVACUATION;  Surgeon: Mora Bellman, MD;  Location: [facility];  Service: Gynecology;  Laterality: N/A;   Family History  Problem Relation Age of Onset  . Cancer Mother     Throat  . Seizures Mother   . Ulcers Mother   . Pancreatitis Mother   . Cancer Father     Throat  . Hypertension Father   . Thyroid disease Father     Hyperthyoid  . Pancreatitis Father   . COPD Father   . Cirrhosis Father     Liver  . Hypertension Paternal Grandmother   . Heart failure Paternal Grandmother   . Hypertension Paternal Aunt   .  Diabetes Maternal Aunt   . Asthma Sister   . Asthma Brother   . Asthma Cousin   . Asthma Paternal Aunt   . Stroke Maternal Uncle   . Stroke Paternal Uncle     x 2  . Sickle cell trait Other     Nephew   Social History  Substance Use Topics  . Smoking status: Current Every Day Smoker -- 0.25 packs/day for 10 years    Types: Cigarettes  . Smokeless tobacco: Never Used  . Alcohol Use: No     Comment: Occasionally   OB History    Gravida Para Term Preterm AB TAB SAB Ectopic Multiple Living   3 2 2  0 1 1 0 0 0 2     Review of Systems  Constitutional: Negative for fever, chills, diaphoresis and unexpected weight change.  Respiratory: Positive for shortness of breath. Negative for cough and chest tightness.   Cardiovascular: Positive for chest pain and leg swelling. Negative for palpitations.   Gastrointestinal: Negative for nausea, vomiting, diarrhea and constipation.  Genitourinary: Positive for flank pain. Negative for dysuria, hematuria, vaginal bleeding, vaginal discharge and pelvic pain.  Musculoskeletal: Negative for back pain.  Skin: Negative for color change and pallor.  Neurological: Negative for dizziness, syncope, weakness and light-headedness.  All other systems reviewed and are negative.     Allergies  Bee venom and Tomato  Home Medications   Prior to Admission medications   Medication Sig Start Date End Date Taking? Authorizing Provider  albuterol (PROVENTIL HFA;VENTOLIN HFA) 108 (90 BASE) MCG/ACT inhaler Inhale 2 puffs into the lungs every 4 (four) hours as needed for wheezing. 04/30/12  Yes Donnel Saxon, CNM  budesonide-formoterol [facility]) 160-4.5 MCG/ACT inhaler Take 2 puffs first thing in am and then another 2 puffs about 12 hours later. Patient taking differently: Inhale 2 puffs into the lungs 2 (two) times daily as needed (for shortness of breath).  05/30/12  Yes Tanda Rockers, MD  guaiFENesin (MUCINEX) 600 MG 12 hr tablet Take 1,200 mg by mouth 2 (two) times daily as needed for cough.   Yes Historical Provider, MD  Multiple Vitamin (MULTIVITAMIN WITH MINERALS) TABS tablet Take 1 tablet by mouth daily.   Yes Historical Provider, MD  azithromycin (ZITHROMAX) 250 MG tablet Take 1 tab by mouth daily for the next 4 days Patient not taking: Reported on 05/26/2015 05/21/15   Velvet Bathe, MD  benzonatate (TESSALON) 100 MG capsule Take 1 capsule (100 mg total) by mouth every 8 (eight) hours. 05/26/15   Vung Kush C Uchechi Denison, PA-C  cefdinir (OMNICEF) 300 MG capsule Take 1 capsule (300 mg total) by mouth 2 (two) times daily. Patient not taking: Reported on 05/26/2015 05/21/15   Velvet Bathe, MD  HYDROcodone-acetaminophen (NORCO/VICODIN) 5-325 MG tablet Take 1-2 tablets by mouth every 4 (four) hours as needed. 05/26/15   Addysin Porco C Brogan England, PA-C  hydrOXYzine (VISTARIL) 50 MG  capsule Take 1 capsule (50 mg total) by mouth every 6 (six) hours as needed for itching. Patient not taking: Reported on 05/19/2015 03/06/12   Arville Go, CNM  predniSONE (DELTASONE) 50 MG tablet Take 1 tab by mouth daily for the next 4 days Patient not taking: Reported on 05/26/2015 05/21/15   Velvet Bathe, MD   BP 124/65 mmHg  Pulse 83  Temp(Src) 97.5 F (36.4 C) (Oral)  Resp 16  SpO2 97%  LMP 05/19/2015 Physical Exam  Constitutional: She appears well-developed and well-nourished. No distress.  HENT:  Head: Normocephalic and atraumatic.  Eyes:  Conjunctivae are normal. Pupils are equal, round, and reactive to light.  Neck: Normal range of motion. Neck supple.  Cardiovascular: Normal rate, regular rhythm, normal heart sounds and intact distal pulses.   +1 edema noted in ankles and feet bilaterally.  Pulmonary/Chest: Breath sounds normal. No accessory muscle usage. Tachypnea noted. No respiratory distress.  Patient is tachypneic at rest and shows increased work of breathing especially when laying flat.  Abdominal: Soft. Normal appearance and bowel sounds are normal. There is no tenderness. There is no CVA tenderness.  Patient complains of some fullness in the abdomen. No discernible abdominal edema.  Musculoskeletal: She exhibits no edema or tenderness.  Lymphadenopathy:    She has no cervical adenopathy.  Neurological: She is alert.  Skin: Skin is warm and dry. She is not diaphoretic.  Nursing note and vitals reviewed.   ED Course  Procedures (including critical care time) Labs Review Labs Reviewed  BASIC METABOLIC PANEL - Abnormal; Notable for the following:    Potassium 3.2 (*)    Calcium 8.6 (*)    All other components within normal limits  CBC - Abnormal; Notable for the following:    WBC 15.4 (*)    All other components within normal limits  LIPASE, BLOOD  URINALYSIS, ROUTINE W REFLEX MICROSCOPIC (NOT AT [facility])  PREGNANCY, URINE  I-STAT TROPOININ, ED    Imaging  Review Dg Chest 2 View  05/26/2015  CLINICAL DATA:  Shortness of breath and cough for 1 week EXAM: CHEST  2 VIEW COMPARISON:  May 19, 2015 FINDINGS: There has been virtually complete clearing of consolidation from the left base compared to prior study. A slight amount of residual atelectatic change is noted in the left base. The lungs elsewhere clear. Heart size and pulmonary vascularity are normal. No adenopathy. There is lower thoracic dextroscoliosis. IMPRESSION: Most of the infiltrate has cleared from the left base compared to 1 week prior. A small amount of atelectasis remains in the left base currently. Lungs elsewhere are now clear. Previous atelectasis in the right base has cleared. No new opacity. No change in cardiac silhouette. Electronically Signed   By: Lowella Grip III M.D.   On: 05/26/2015 17:01   Ct Angio Chest Pe W/cm &/or Wo Cm  05/26/2015  CLINICAL DATA:  Recent pneumonia EXAM: CT ANGIOGRAPHY CHEST WITH CONTRAST TECHNIQUE: Multidetector CT imaging of the chest was performed using the standard protocol during bolus administration of intravenous contrast. Multiplanar CT image reconstructions and MIPs were obtained to evaluate the vascular anatomy. CONTRAST:  176m OMNIPAQUE IOHEXOL 350 MG/ML SOLN COMPARISON:  Chest x-ray 05/26/2015 and 05/19/15 FINDINGS: Sagittal images of the spine are unremarkable. Central airways are patent. No mediastinal hematoma or adenopathy. Thoracic aorta is unremarkable. The study is of excellent technical quality. No pulmonary embolus. Heart size within normal limits. No pericardial effusion. The visualized upper abdomen is unremarkable. There is no pulmonary edema. Small bilateral basilar posterior atelectasis. Small atelectasis noted in left base anterolaterally. Mild perihilar peribronchial thickening. Mild bronchitic changes cannot be excluded. No significant hilar adenopathy.  There is no pulmonary edema. Review of the MIP images confirms the above  findings. IMPRESSION: 1. No pulmonary embolus is noted. 2. No mediastinal hematoma or adenopathy. 3. Mild streaky atelectasis bilateral lower lobe posteriorly. Mild atelectasis left base anterolaterally. No segmental infiltrates. No pulmonary edema. 4. Mild perihilar peribronchial thickening. Mild bronchitic changes cannot be excluded. Electronically Signed   By: LLahoma CrockerM.D.   On: 05/26/2015 19:46   I have  personally reviewed and evaluated these images and lab results as part of my medical decision-making.   EKG Interpretation   Date/Time:  Thursday May 26 2015 16:33:22 EST Ventricular Rate:  81 PR Interval:  137 QRS Duration: 109 QT Interval:  402 QTC Calculation: 467 R Axis:   85 Text Interpretation:  Sinus rhythm Probable left atrial enlargement  Anterior infarct, old Poor R wave progression ECG OTHERWISE WITHIN NORMAL  LIMITS since last tracing no significant change Confirmed by MILLER  MD,  Douglas ([id number]) on 05/26/2015 4:48:18 PM      MDM   Final diagnoses:  CAP (community acquired pneumonia)  Shortness of breath  Peripheral edema    Deniece Rankin presents with shortness of breath for the last 2 weeks and peripheral edema since last night.  Findings and plan of care discussed with Noemi Chapel, MD.  Low suspicion for PE, but still on the differential. Patient is low risk on Wells criteria. Patient appears uncomfortable and tachypneic, but not tachycardic and is afebrile. Patient noted to be hypertensive without a previous history of such. 7:01 PM Pt states she feels the same, even after a breathing treatment. CTA was ordered. Pt continues to maintain adequate oxygen saturations and remains non-tachycardic.  CT shows no evidence of a PE or other acute abnormalities, most notably there is no evidence of infiltrates. Results of the CT were communicated with patient. Patient remains not tachycardic and hypertension is resolved. Pt states her breathing feels the same, but  her pain has resolved and her O2 saturations remain excellent. In summary, patient is afebrile, not tachycardic, normotensive, nontoxic appearing, alert and oriented, and maintains an excellent oxygen saturation. Patient fits no admission criteria at this time. Patient was advised to return should she begin to feel worse. Patient was also advised to follow-up with a PCP within the next week. Patient voiced understanding of these instructions, accepts the plan, and is comfortable with discharge.  Filed Vitals:   05/26/15 1629 05/26/15 1818 05/26/15 1826 05/26/15 1923  BP: 169/100 131/81  124/65  Pulse: 76 68  83  Temp: 97.5 F (36.4 C)     TempSrc: Oral     Resp: 18 16  16   SpO2: 97% 95% 100% 97%     Lorayne Bender, PA-C 05/26/15 2030  Noemi Chapel, MD 05/29/15 [phone]

## 2015-05-26 NOTE — ED Notes (Signed)
Pt recently diagnosed with pneumonia. Finished antibiotics yesterday. Now having swelling in her feet/legs, pain in her right flank, headaches, and SOB. Says she's feeling like her heart is racing, HR 84 in triage. Says the doctor that saw her at Urgent care was worried the antibiotics may have done damage to her kidneys and caused her to start retaining fluid, wanted her to come to be seen

## 2015-05-26 NOTE — Discharge Instructions (Signed)
You have been seen today for shortness of breath and leg swelling. Your imaging and lab tests showed no new abnormalities. Follow up with PCP as needed. Return to ED should symptoms worsen. You may use ibuprofen or Vicodin for pain.   Emergency Department Resource Guide 1) Find a Doctor and Pay Out of Pocket Although you won't have to find out who is covered by your insurance plan, it is a good idea to ask around and get recommendations. You will then need to call the office and see if the doctor you have chosen will accept you as a new patient and what types of options they offer for patients who are self-pay. Some doctors offer discounts or will set up payment plans for their patients who do not have insurance, but you will need to ask so you aren't surprised when you get to your appointment.  2) Contact Your Local Health Department Not all health departments have doctors that can see patients for sick visits, but many do, so it is worth a call to see if yours does. If you don't know where your local health department is, you can check in your phone book. The CDC also has a tool to help you locate your state's health department, and many state websites also have listings of all of their local health departments.  3) Find a [facility] If your illness is not likely to be very severe or complicated, you may want to try a walk in clinic. These are popping up all over the country in pharmacies, drugstores, and shopping centers. They're usually staffed by nurse practitioners or physician assistants that have been trained to treat common illnesses and complaints. They're usually fairly quick and inexpensive. However, if you have serious medical issues or chronic medical problems, these are probably not your best option.  No Primary Care Doctor: - Call Health Connect at  [phone] - they can help you locate a primary care doctor that  accepts your insurance, provides certain services, etc. - Physician  Referral Service- [phone]  Chronic Pain Problems: Organization         Address  Phone   Notes  [facility]  [phone] Patients need to be referred by their primary care doctor.   Medication Assistance: Organization         Address  Phone   Notes  [facility] Medication [facility] [address] [place], [place] [address] [phone] --Must be a resident of [facility] -- Must have NO insurance coverage whatsoever (no Medicaid/ Medicare, etc.) -- The pt. MUST have a primary care doctor that directs their care regularly and follows them in the community   [facility]  [phone]   [employer]  [phone]    Agencies that provide inexpensive medical care: Organization         Address  Phone   Notes  [place]  [phone]   [facility]    [phone]   [facility] [place], [place] [address] [phone]   [place] [address], [place] [phone]   Planned Parenthood    [phone]   [facility]    [phone]   [place] and [place] [address], [place] Phone:  [phone], Fax:  [phone] Hours of Operation:  9 am - 6 pm, M-F.  Also accepts Medicaid/Medicare  and self-pay.  [facility] for [place], [address], [place] Phone: [phone], Fax: [phone]. Hours of Operation:  8:30 am - 5:30 pm, M-F.  Also accepts Medicaid and self-pay.  [facility] [place] [address] [place] Phone: [phone]   [place], [place], [place] [phone], Ext. [phone] Mondays & Thursdays: 7-9 AM.  First 15 patients are seen on a first come, first serve basis.    [place] Providers:  Organization         Address  Phone   Notes  [facility] [address],  [phone] Also accepts self-pay patients.  [facility] [address], [place]  [phone]   [place], [address], [place] [phone]   [facility] [address], [place] [phone]   Lucianne Lei [address], [place]   [phone] Only accepts [place] Access [place] patients after they have their name applied to their card.   Self-Pay (no insurance) in [facility]:  Organization         Address  Phone   Notes  Sickle Cell Patients, [facility] [place] [phone]   [facility] [place] [phone]   [facility] [place]  [place], [place],  [phone]   [facility]/Dr. Osei-Bonsu  [address], [place] or [address], [place] [phone] Phone number for both [place] and [place] locations is the same.  Urgent Medical and [facility] [address], [place] [phone]   [facility] [address] [place] or [address] [phone]   [facility] [address], [place] [phone], phone; [phone], fax Sees patients 1st and 3rd Saturday of every month.  Must not qualify for public or private insurance (i.e. Medicaid, Medicare, [place] Health Choice, Veterans' Benefits)  Household income should be no more than 200% of the poverty level The clinic cannot treat you if you are pregnant or think you are pregnant  Sexually transmitted diseases are not treated at the clinic.    Dental Care: Organization         Address  Phone  Notes  [facility] Department of [facility] [place] [phone] Accepts children up to age 51 who are enrolled  in [place] or [place]; pregnant women with a Medicaid card; and children who have applied for Medicaid or [place] Health Choice, but were declined, whose parents can pay a reduced fee at time of service.  [facility] Department of [facility]  [address], [place] [phone] Accepts children up to age 68 who are enrolled in [place] or [place]; pregnant women with a Medicaid card; and children who have applied for Medicaid or  Health Choice, but were declined, whose parents can pay a reduced fee at time of service.  [facility]  [place] [phone] Patients are seen by appointment only. Walk-ins are not accepted. [place] will see patients 22 years of age and older. Monday - Tuesday (8am-5pm) Most Wednesdays (  8:30-5pm) $30 per visit, cash only  [facility] Adult [facility] Access PROGRAM  [address], [facility] [phone] Patients are seen by appointment only. Walk-ins are not accepted. [place] will see patients 65 years of age and older. One Wednesday Evening (Monthly: Volunteer Based).  $30 per visit, cash only  [place]  [phone] for adults; Children under age 65, call Graduate Pediatric Dentistry at [phone]. Children aged 30-14, please call [phone] to request a pediatric application.  Dental services are provided in all areas of dental care including fillings, crowns and bridges, complete and partial dentures, implants, gum treatment, root canals, and extractions. Preventive care is also provided. Treatment is provided to both adults and children. Patients are selected via a lottery and there is often a waiting list.   [facility] [address], [place]  [phone] [email or web address]   [facility] [address] [place], [place] [phone], Ext. [phone] Second and Fourth Thursday of each month, opens at  6:30 AM; Clinic ends at 9 AM.  Patients are seen on a first-come first-served basis, and a limited number are seen during each clinic.   [facility]  [address] Hillard Danker [place], [place] [phone]   Eligibility Requirements You must have lived in [place], [place], or [place] counties for at least the last three months.   You cannot be eligible for state or federal sponsored [employer], including [employer], [place], or [employer].   You generally cannot be eligible for healthcare insurance through your employer.    How to apply: Eligibility screenings are held every Tuesday and Wednesday afternoon from 1:00 pm until 4:00 pm. You do not need an appointment for the interview!  [facility] [address] [place], [place]   [place]  [place] Department  [place]  [phone]    Behavioral Health Resources in the Community: Intensive Outpatient Programs Organization         Address  Phone  Notes  [place]. [address] [place], [place] [phone]   [facility] [address] [place], [place]   ADS: Alcohol & Drug Svcs [address], [place], [place]   [place] [address],  [place], [place] or [phone]   Substance Abuse Resources Organization         Address  Phone  Notes  Alcohol and Drug Services  [phone]   [place]  [phone]   The [place]   Chinita Pester  [phone]   Residential & Outpatient Substance Abuse Program  [phone]   Psychological Services Organization         Address  Phone  Notes  [facility]  [place]  [phone]   [place] [address] [place] or [phone]    Mobile Crisis Teams Organization         Address  Phone  Notes  [facility], Mobile Crisis Care Unit  [phone]   [facility]  [address] [place], [place]   [place] [address] [place] [phone]    Self-Help/Support Groups Organization         Address  Phone  Notes  Mental Health Assoc. of [place] - variety of support groups  [place] Call for more information  Narcotics Anonymous (NA), [facility] [address], [employer] [place]  2 meetings at this location   Special educational needs teacher         Address  Phone  Notes  ASAP Residential Treatment [place],    [place]  [phone]   [facility]  [address], [place] [phone], [place], [place]   [place], [place] [phone] Admissions: 8am-3pm M-F  Incentives Substance [place] [address],    [place], [place] [phone]   [facility] [address] [place], [place], [place]   [facility] [address],  [place], [place]   [facility] Programs - Intensive Outpatient [address] Kristeen Mans [address], [place], [place]   [facility] ([place] [address].,  [place], [place] [phone] or [phone]   [facility] Services (RTS) [address] [place], [place] Accepts Medicaid  [place] [address]  [place] [phone] Substance Abuse/Addiction Treatment   [facility] Organization         Address  Phone  Notes  [facility]  [phone]   Domenic Schwab, PhD [address] Arlis Porta [place], [place]   [phone] or [phone]   [place], [place] [phone]   [facility]  [address] [place], [place] [phone] Insurance/Medicaid/sponsorship through [facility] and Families [address], [address] [place]                                    [place], [place] [phone] [place] [address], [place] [phone]    Dr. Adele Schilder  [phone]   [facility] Dept. 1) [address], [place] 2) [place] 3)  [place] [address], [place] [phone]  [phone]   [place] [phone] or [phone] (After Hours)

## 2016-01-16 ENCOUNTER — Emergency Department (HOSPITAL_COMMUNITY)
Admission: EM | Admit: 2016-01-16 | Discharge: 2016-01-16 | Disposition: A | Discharge status: Home / Self Care | Payer: Worker's Compensation | Attending: Emergency Medicine | Admitting: Emergency Medicine

## 2016-01-16 ENCOUNTER — Encounter (HOSPITAL_COMMUNITY): Payer: Self-pay

## 2016-01-16 ENCOUNTER — Emergency Department (HOSPITAL_COMMUNITY): Payer: Worker's Compensation

## 2016-01-16 DIAGNOSIS — Y990 Civilian activity done for income or pay: Secondary | ICD-10-CM | POA: Diagnosis not present

## 2016-01-16 DIAGNOSIS — Y9389 Activity, other specified: Secondary | ICD-10-CM | POA: Insufficient documentation

## 2016-01-16 DIAGNOSIS — X501XXA Overexertion from prolonged static or awkward postures, initial encounter: Secondary | ICD-10-CM | POA: Insufficient documentation

## 2016-01-16 DIAGNOSIS — F17210 Nicotine dependence, cigarettes, uncomplicated: Secondary | ICD-10-CM | POA: Insufficient documentation

## 2016-01-16 DIAGNOSIS — Y92129 Unspecified place in nursing home as the place of occurrence of the external cause: Secondary | ICD-10-CM | POA: Insufficient documentation

## 2016-01-16 DIAGNOSIS — J45909 Unspecified asthma, uncomplicated: Secondary | ICD-10-CM | POA: Diagnosis not present

## 2016-01-16 DIAGNOSIS — M549 Dorsalgia, unspecified: Secondary | ICD-10-CM | POA: Diagnosis not present

## 2016-01-16 DIAGNOSIS — Z79899 Other long term (current) drug therapy: Secondary | ICD-10-CM | POA: Diagnosis not present

## 2016-01-16 DIAGNOSIS — Z792 Long term (current) use of antibiotics: Secondary | ICD-10-CM | POA: Insufficient documentation

## 2016-01-16 LAB — PREGNANCY, URINE: Preg Test, Ur: NEGATIVE

## 2016-01-16 MED ORDER — OXYCODONE-ACETAMINOPHEN 5-325 MG PO TABS
1.0000 | ORAL_TABLET | Freq: Once | ORAL | Status: AC
  Administered 2016-01-16: 1 via ORAL
  Filled 2016-01-16: qty 1

## 2016-01-16 MED ORDER — METHOCARBAMOL 500 MG PO TABS: 500.0000 mg | ORAL_TABLET | Freq: Two times a day (BID) | ORAL | 0 refills | Status: DC

## 2016-01-16 MED ORDER — NAPROXEN 500 MG PO TABS: 500.0000 mg | ORAL_TABLET | Freq: Two times a day (BID) | ORAL | 0 refills | Status: DC

## 2016-01-16 NOTE — ED Provider Notes (Signed)
[facility] DEPT Provider Note   CSN: [id number] Arrival date & time: 01/16/16  1742  First Provider Contact:  First MD Initiated Contact with Patient 01/16/16 1930   By signing my name below, I, Dyke Brackett, attest that this documentation has been prepared under the direction and in the presence of non-physician practitioner, Quincy Carnes, PA-C Electronically Signed: Dyke Brackett, Scribe. 01/16/2016. 7:59 PM.  History   Chief Complaint Chief Complaint  Patient presents with  . Back Pain    HPI Terris Bodin is a 40 y.o. female brought in by ambulance who presents to the Emergency Department complaining of sudden onset left lower back pain with radiation to left leg today PTA.  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 Pt states felt and heard a "pop" from her left lower back. Pain is worsened when bearing weight on left leg.She denies any numbness or weakness of her left leg. No bowel bladder incontinence. She is not tried any medications prior to arrival. No alleviating factors noted. Pt reports history of scoliosis, no prior surgeries.  The history is provided by the patient. No language interpreter was used.    Past Medical History:  Diagnosis Date  . Anemia    iron supplements in the past  . Anxiety    has been prescribed Xanax  . Asthma    has albuterol, advair prn, no attack x 2 years;triggered by strong smells (bleach and smoke)  . Benign breast cyst in female 1995   had bx done was normal  . DUB (dysfunctional uterine bleeding) 1995  . Family hx cleft lip 04/29/2012   FOB's son   . Gestational Hypertension 07/17/2012  . Headache(784.0)    during and prior to pregnancy;usually Rt side of head;can effect vision  . Heart murmur   . Hx of preeclampsia, prior pregnancy, currently pregnant 03/12/2012   Induced at 38wks, no meds    . Infection    Yeast inf;gets freq w/ antibxs or scented soaps  . Infection    BV;recently treated  for BV completed Flagyl  . Memory loss 2013   Currently having difficult time remembering things  . 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 Ovarian cyst in pregnancy 2013   seen on recent US  . Preeclampsia 1994   induced @ 38 weeks  . Scoliosis     Patient Active Problem List   Diagnosis Date Noted  . CAP (community acquired pneumonia) 05/19/2015  . Acute respiratory failure with hypoxia ([place]) 05/19/2015  . Hypokalemia 05/19/2015  . Normocytic normochromic anemia 05/19/2015  . Pneumonia 05/19/2015  . Nexplanon insertion 10/27/2012  . Retained products of conception, postpartum 09/29/2012  . 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 Elevated transaminase level 06/23/2012  . Anemia 06/09/2012  . 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 Dyspnea 05/16/2012  . Abnormal CXR 05/16/2012  . Scoliosis 04/29/2012  . Asthma 03/12/2012  . Smoker 03/12/2012  . Anxiety 03/12/2012    Past Surgical History:  Procedure Laterality Date  . DILATION AND EVACUATION N/A 09/29/2012   Procedure: DILATATION AND EVACUATION;  Surgeon: Mora Bellman, MD;  Location: [facility];  Service: Gynecology;  Laterality: N/A;  . NO PAST SURGERIES    . WISDOM TOOTH EXTRACTION  05/2011   all 4 removed    OB History    Gravida Para Term Preterm AB Living   3 2 2  0 1 2   SAB  TAB Ectopic Multiple Live Births   0 1 0 0 2       Home Medications    Prior to Admission medications   Medication Sig Start Date End Date Taking? Authorizing Provider  albuterol (PROVENTIL HFA;VENTOLIN HFA) 108 (90 BASE) MCG/ACT inhaler Inhale 2 puffs into the lungs every 4 (four) hours as needed for wheezing. 04/30/12   Donnel Saxon, CNM  azithromycin (ZITHROMAX) 250 MG tablet Take 1 tab by mouth daily for the next 4 days Patient not taking: Reported on 05/26/2015 05/21/15   Velvet Bathe, MD  benzonatate (TESSALON) 100 MG capsule Take 1 capsule (100 mg total) by mouth every 8 (eight) hours. 05/26/15   Shawn C Joy, PA-C    budesonide-formoterol (SYMBICORT) 160-4.5 MCG/ACT inhaler Take 2 puffs first thing in am and then another 2 puffs about 12 hours later. Patient taking differently: Inhale 2 puffs into the lungs 2 (two) times daily as needed (for shortness of breath).  05/30/12   Tanda Rockers, MD  cefdinir (OMNICEF) 300 MG capsule Take 1 capsule (300 mg total) by mouth 2 (two) times daily. Patient not taking: Reported on 05/26/2015 05/21/15   Velvet Bathe, MD  guaiFENesin (MUCINEX) 600 MG 12 hr tablet Take 1,200 mg by mouth 2 (two) times daily as needed for cough.    Historical Provider, MD  HYDROcodone-acetaminophen (NORCO/VICODIN) 5-325 MG tablet Take 1-2 tablets by mouth every 4 (four) hours as needed. 05/26/15   Shawn C Joy, PA-C  hydrOXYzine (VISTARIL) 50 MG capsule Take 1 capsule (50 mg total) by mouth every 6 (six) hours as needed for itching. Patient not taking: Reported on 05/19/2015 03/06/12   Arville Go, CNM  Multiple Vitamin (MULTIVITAMIN WITH MINERALS) TABS tablet Take 1 tablet by mouth daily.    Historical Provider, MD  predniSONE (DELTASONE) 50 MG tablet Take 1 tab by mouth daily for the next 4 days Patient not taking: Reported on 05/26/2015 05/21/15   Velvet Bathe, MD    Family History Family History  Problem Relation Age of Onset  . Cancer Mother     Throat  . Seizures Mother   . Ulcers Mother   . Pancreatitis Mother   . Cancer Father     Throat  . Hypertension Father   . Thyroid disease Father     Hyperthyoid  . Pancreatitis Father   . COPD Father   . Cirrhosis Father     Liver  . Hypertension Paternal Grandmother   . Heart failure Paternal Grandmother   . Hypertension Paternal Aunt   . Diabetes Maternal Aunt   . Asthma Sister   . Asthma Brother   . Asthma Cousin   . Asthma Paternal Aunt   . Stroke Maternal Uncle   . Stroke Paternal Uncle     x 2  . Sickle cell trait Other     Nephew    Social History Social History  Substance Use Topics  . Smoking status:  Current Every Day Smoker    Packs/day: 0.25    Years: 10.00    Types: Cigarettes  . Smokeless tobacco: Never Used  . Alcohol use No     Comment: Occasionally     Allergies   Bee venom and Tomato   Review of Systems Review of Systems  Constitutional: Negative for fever.  Musculoskeletal: Positive for back pain and myalgias.  All other systems reviewed and are negative.    Physical Exam Updated Vital Signs BP 131/84 (BP Location: Left Arm)   Pulse  98   Temp 99 F (37.2 C) (Oral)   Resp 18   Ht 5' 8"  (1.727 m)   Wt 130 lb (59 kg)   SpO2 99%   BMI 19.77 kg/m   Physical Exam  Constitutional: She is oriented to person, place, and time. She appears well-developed and well-nourished.  HENT:  Head: Normocephalic and atraumatic.  Mouth/Throat: Oropharynx is clear and moist.  Eyes: Conjunctivae and EOM are normal. Pupils are equal, round, and reactive to light.  Neck: Normal range of motion.  Cardiovascular: Normal rate, regular rhythm and normal heart sounds.   Pulmonary/Chest: Effort normal and breath sounds normal.  Abdominal: Soft. Bowel sounds are normal.  Musculoskeletal: Normal range of motion.  Left lumbar paraspinal region and SI joint are tender to palpation, there are no noted deformities or step-off, - SLR on left, increased pain noted with weight bearing on left leg; normal strength/sensation of left leg DP pulses intact bilaterally  Neurological: She is alert and oriented to person, place, and time.  Skin: Skin is warm and dry.  Psychiatric: She has a normal mood and affect.  Nursing note and vitals reviewed.    ED Treatments / Results  DIAGNOSTIC STUDIES:  Oxygen Saturation is 99% on RA, normal by my interpretation.    COORDINATION OF CARE:  7:30 PM Will order DG lumbar spine.Discussed treatment plan with pt at bedside and pt agreed to plan.  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 EXAM: LUMBAR SPINE - COMPLETE 4+ VIEW COMPARISON:  Bold FINDINGS: There is no evidence of lumbar spine fracture. Alignment is normal. Intervertebral disc spaces are maintained. IMPRESSION: Negative. Electronically Signed   By: Nolon Nations M.D.   On: 01/16/2016 21:33    Procedures Procedures (including critical care time)  Medications Ordered in ED Medications - No data to display   Initial Impression / Assessment and Plan / ED Course  I have reviewed the triage vital signs and the nursing notes.  Pertinent labs & imaging results that were available during my care of the patient were reviewed by me and considered in my medical decision making (see chart for details).  Clinical Course   40 year old female here with low back pain after lifting a resident's leg at her work. She is afebrile and nontoxic. She has tenderness of the left lumbar paraspinal region and SI joint. There are no noted deformities or step-off. She has a negative straight leg raise and left leg is neurovascularly intact. She has no current deficits to suggest cauda equina. Given abrupt onset of her pain and history of scoliosis, x-ray was obtained-- no acute findings noted.  Suspect lumbar strain.  Will d/c home with supportive care.  She requested set of crutches to assist with ambulation for the next few days which were given.  Patient does not have PCP currently, given follow-up with wellness clinic.  Discussed plan with patient, she acknowledged understanding and agreed with plan of care.  Return precautions given for new or worsening symptoms.  Final Clinical Impressions(s) / ED Diagnoses   Final diagnoses:  Back pain, unspecified location   I personally performed the  services described in  this documentation, which was scribed in my presence. The recorded information has been reviewed and is accurate.  New Prescriptions New Prescriptions   METHOCARBAMOL (ROBAXIN) 500 MG TABLET    Take 1 tablet (500 mg total) by mouth 2 (two) times daily.   NAPROXEN (NAPROSYN) 500 MG TABLET    Take 1 tablet (500 mg total) by mouth 2 (two) times daily with a meal.     Larene Pickett, PA-C 01/16/16 2152    Lacretia Leigh, MD 01/20/16 1540

## 2016-01-16 NOTE — Discharge Instructions (Signed)
Take the prescribed medication as directed. Recommend to rest your back tomorrow. [facility] if not improving in the next few days-- call to make appt. Return to the ED for new or worsening symptoms.

## 2016-01-16 NOTE — Progress Notes (Addendum)
Pt c/o severe low back pain. She stated she was helping a resident to eat dinner and when she bent down she felt something pop in her lower portion of her back. Pt also has pain shooting down her right leg. Ice pack remains on pts back. She states this happpened at 4pm. Received a phone call from the x-ray dept that pts needs a urine pregnancy test. PA-Pt returned fro m the x-ray dept. Tolerated well. Positioned with a pillow.

## 2016-01-16 NOTE — ED Triage Notes (Addendum)
BIB EMS from work w/ c/o hearing a "pop" from her left lower back while lifting a residents leg. Pt ambulatory at the scene w/ assistance. Pt arrives A+OX4, speaking in complete sentences. Pt denies hx of back injuries.

## 2016-06-07 ENCOUNTER — Ambulatory Visit (INDEPENDENT_AMBULATORY_CARE_PROVIDER_SITE_OTHER): Payer: BLUE CROSS/BLUE SHIELD | Admitting: Family Medicine

## 2016-06-07 ENCOUNTER — Ambulatory Visit (INDEPENDENT_AMBULATORY_CARE_PROVIDER_SITE_OTHER): Payer: BLUE CROSS/BLUE SHIELD

## 2016-06-07 VITALS — BP 136/80 | HR 103 | Temp 97.9°F | Resp 18 | Ht 68.0 in | Wt 134.0 lb

## 2016-06-07 DIAGNOSIS — N939 Abnormal uterine and vaginal bleeding, unspecified: Secondary | ICD-10-CM | POA: Diagnosis not present

## 2016-06-07 DIAGNOSIS — R079 Chest pain, unspecified: Secondary | ICD-10-CM

## 2016-06-07 DIAGNOSIS — R000 Tachycardia, unspecified: Secondary | ICD-10-CM | POA: Diagnosis not present

## 2016-06-07 DIAGNOSIS — M549 Dorsalgia, unspecified: Secondary | ICD-10-CM

## 2016-06-07 DIAGNOSIS — R519 Headache, unspecified: Secondary | ICD-10-CM

## 2016-06-07 DIAGNOSIS — R42 Dizziness and giddiness: Secondary | ICD-10-CM | POA: Diagnosis not present

## 2016-06-07 DIAGNOSIS — R51 Headache: Secondary | ICD-10-CM

## 2016-06-07 LAB — POCT URINALYSIS DIP (MANUAL ENTRY)
BILIRUBIN UA: NEGATIVE
BILIRUBIN UA: NEGATIVE
Glucose, UA: NEGATIVE
LEUKOCYTES UA: NEGATIVE
Nitrite, UA: NEGATIVE
Protein Ur, POC: NEGATIVE
Spec Grav, UA: 1.01
Urobilinogen, UA: 0.2
pH, UA: 6

## 2016-06-07 LAB — POCT CBC
Granulocyte percent: 64.7 %G (ref 37–80)
HCT, POC: 40.3 % (ref 37.7–47.9)
HEMOGLOBIN: 13.6 g/dL (ref 12.2–16.2)
LYMPH, POC: 2.5 (ref 0.6–3.4)
MCH, POC: 30 pg (ref 27–31.2)
MCHC: 33.8 g/dL (ref 31.8–35.4)
MCV: 88.8 fL (ref 80–97)
MID (CBC): 0.6 (ref 0–0.9)
MPV: 7.9 fL (ref 0–99.8)
POC Granulocyte: 5.8 (ref 2–6.9)
POC LYMPH PERCENT: 28.5 %L (ref 10–50)
POC MID %: 6.8 % (ref 0–12)
Platelet Count, POC: 238 10*3/uL (ref 142–424)
RBC: 4.54 M/uL (ref 4.04–5.48)
RDW, POC: 13.8 %
WBC: 8.9 10*3/uL (ref 4.6–10.2)

## 2016-06-07 LAB — POC MICROSCOPIC URINALYSIS (UMFC): MUCUS RE: ABSENT

## 2016-06-07 LAB — POCT URINE PREGNANCY: PREG TEST UR: NEGATIVE

## 2016-06-07 MED ORDER — NAPROXEN 500 MG PO TABS: 500.0000 mg | ORAL_TABLET | Freq: Two times a day (BID) | ORAL | 0 refills | Status: DC

## 2016-06-07 MED ORDER — CYCLOBENZAPRINE HCL 10 MG PO TABS: 10.0000 mg | ORAL_TABLET | Freq: Three times a day (TID) | ORAL | 0 refills | Status: DC | PRN

## 2016-06-07 MED ORDER — METOPROLOL TARTRATE 25 MG PO TABS: ORAL_TABLET | ORAL | 3 refills | Status: AC

## 2016-06-07 NOTE — Patient Instructions (Addendum)
Start Metoprolol 25 mg once daily as needed for palpitations.    IF you received an x-ray today, you will receive an invoice from [facility]. Please contact [facility] at [phone] with questions or concerns regarding your invoice.   IF you received labwork today, you will receive an invoice from [facility]. Please contact [facility] at [phone] with questions or concerns regarding your invoice.   Our billing staff will not be able to assist you with questions regarding bills from these companies.  You will be contacted with the lab results as soon as they are available. The fastest way to get your results is to activate your My Chart account. Instructions are located on the last page of this paperwork. If you have not heard from [place] regarding the results in 2 weeks, please contact this office.     Abnormal Uterine Bleeding Abnormal uterine bleeding can affect women at various stages in life, including teenagers, women in their reproductive years, pregnant women, and women who have reached menopause. Several kinds of uterine bleeding are considered abnormal, including:  Bleeding or spotting between periods.  Bleeding after sexual intercourse.  Bleeding that is heavier or more than normal.  Periods that last longer than usual.  Bleeding after menopause. Many cases of abnormal uterine bleeding are minor and simple to treat, while others are more serious. Any type of abnormal bleeding should be evaluated by your health care provider. Treatment will depend on the cause of the bleeding. Follow these instructions at home: Monitor your condition for any changes. The following actions may help to alleviate any discomfort you are experiencing:  Avoid the use of tampons and douches as directed by your health care provider.  Change your pads frequently. You should get regular pelvic exams and Pap tests. Keep all follow-up appointments for diagnostic tests as directed by  your health care provider. Contact a health care provider if:  Your bleeding lasts more than 1 week.  You feel dizzy at times. Get help right away if:  You pass out.  You are changing pads every 15 to 30 minutes.  You have abdominal pain.  You have a fever.  You become sweaty or weak.  You are passing large blood clots from the vagina.  You start to feel nauseous and vomit. This information is not intended to replace advice given to you by your health care provider. Make sure you discuss any questions you have with your health care provider. Document Released: 05/28/2005 Document Revised: 11/09/2015 Document Reviewed: 12/25/2012 Elsevier Interactive Patient Education  2017 [employer].

## 2016-06-07 NOTE — Progress Notes (Signed)
Patient ID: Anne Olson, female    DOB: [birth year], 40 y.o.   MRN: [id number]  PCP: Pcp Not In System  Chief Complaint  Patient presents with  . Headache  . Dizziness  . Chest Pain  . Back Pain    Subjective:  HPI  40 year old presents for evaluation of headache, dizziness, chest, and back pain and vaginal bleeding. Bleeding continuously since August, and heavy, mild, with occasional clotting. Darkened-light pink mixed clotts. Weak and fatigue with dizziness episodes w/ blurry vision. She waited to so long for evaluation due to no insurance until now. Last PAP in 2015, reports normal findings. Reports some pain in left flank. Hx in teens of irregular heavy period. Currently has a Nexplanon implant which was originally placed in 2015. No lower abdominal or pelvic pain. No pain with urination. Chest pain with back pain and under left arm Reports palpitations and jitteriness. No shortness of breath    Social History   Social History  . Marital status: Single    Spouse name: N/A  . Number of children: 1  . Years of education: 66   Occupational History  . Resident Care Assistant/Med tech    Social History Main Topics  . Smoking status: Current Every Day Smoker    Packs/day: 0.25    Years: 10.00    Types: Cigarettes  . Smokeless tobacco: Never Used  . Alcohol use No     Comment: Occasionally  . Drug use: No  . Sexual activity: No     Comment: Has Norplant in Lt arm that is still in place - placed in 1994   Other Topics Concern  . Not on file   Social History Narrative  . No narrative on file    Family History  Problem Relation Age of Onset  . Cancer Mother     Throat  . Seizures Mother   . Ulcers Mother   . Pancreatitis Mother   . Cancer Father     Throat  . Hypertension Father   . Thyroid disease Father     Hyperthyoid  . Pancreatitis Father   . COPD Father   . Cirrhosis Father     Liver  . Asthma Sister   . Asthma Brother   . Hypertension  Paternal Grandmother   . Heart failure Paternal Grandmother   . Hypertension Paternal Aunt   . Diabetes Maternal Aunt   . Asthma Cousin   . Asthma Paternal Aunt   . Stroke Maternal Uncle   . Stroke Paternal Uncle     x 2  . Sickle cell trait Other     Nephew   Review of Systems  See HPI  Patient Active Problem List   Diagnosis Date Noted  . CAP (community acquired pneumonia) 05/19/2015  . Acute respiratory failure with hypoxia ([place]) 05/19/2015  . Hypokalemia 05/19/2015  . Normocytic normochromic anemia 05/19/2015  . Pneumonia 05/19/2015  . Nexplanon insertion 10/27/2012  . Retained products of conception, postpartum 09/29/2012  . 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 Elevated transaminase level 06/23/2012  . Anemia 06/09/2012  . 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 Dyspnea 05/16/2012  . Abnormal CXR 05/16/2012  . Scoliosis 04/29/2012  . Asthma 03/12/2012  . Smoker 03/12/2012  . Anxiety 03/12/2012    Allergies  Allergen Reactions  . Bee Venom Anaphylaxis and Swelling    Throat swells  . Tomato Anaphylaxis and Hives    & throat swelling    Prior to Admission medications  Medication Sig Start Date End Date Taking? Authorizing Provider  methocarbamol (ROBAXIN) 500 MG tablet Take 1 tablet (500 mg total) by mouth 2 (two) times daily. 01/16/16  Yes Larene Pickett, PA-C  albuterol (PROVENTIL HFA;VENTOLIN HFA) 108 (90 BASE) MCG/ACT inhaler Inhale 2 puffs into the lungs every 4 (four) hours as needed for wheezing. 04/30/12   Donnel Saxon, CNM  budesonide-formoterol (SYMBICORT) 160-4.5 MCG/ACT inhaler Take 2 puffs first thing in am and then another 2 puffs about 12 hours later. Patient not taking: Reported on 06/07/2016 05/30/12   Tanda Rockers, MD  gabapentin (NEURONTIN) 300 MG capsule Take 300 mg by mouth 3 (three) times daily.    Historical Provider, MD  HYDROcodone-acetaminophen (NORCO/VICODIN) 5-325 MG tablet Take 1-2 tablets by mouth every 4 (four) hours as needed. Patient not  taking: Reported on 06/07/2016 05/26/15   Shawn C Joy, PA-C  hydrOXYzine (VISTARIL) 50 MG capsule Take 1 capsule (50 mg total) by mouth every 6 (six) hours as needed for itching. Patient not taking: Reported on 06/07/2016 03/06/12   Arville Go, CNM  ibuprofen (ADVIL,MOTRIN) 800 MG tablet Take 800 mg by mouth every 8 (eight) hours as needed.    Historical Provider, MD  Multiple Vitamin (MULTIVITAMIN WITH MINERALS) TABS tablet Take 1 tablet by mouth daily.    Historical Provider, MD    Past Medical, Surgical Family and Social History reviewed and updated.    Objective:   Today's Vitals   06/07/16 1656  BP: 136/80  Pulse: (!) 103  Resp: 18  Temp: 97.9 F (36.6 C)  TempSrc: Oral  SpO2: 100%  Weight: 134 lb (60.8 kg)  Height: 5' 8"  (1.727 m)    Wt Readings from Last 3 Encounters:  06/07/16 134 lb (60.8 kg)  01/16/16 130 lb (59 kg)  05/19/15 130 lb (59 kg)   Physical Exam  Constitutional: She is oriented to person, place, and time. She appears well-developed and well-nourished.  HENT:  Head: Normocephalic and atraumatic.  Right Ear: External ear normal.  Left Ear: External ear normal.  Nose: Nose normal.  Mouth/Throat: Oropharynx is clear and moist.  Eyes: Pupils are equal, round, and reactive to light.  Neck: Normal range of motion. Neck supple.  Cardiovascular: Normal rate, regular rhythm, normal heart sounds and intact distal pulses.   EKG:  Atrial enlargement with mild prolongation   Pulmonary/Chest: Effort normal and breath sounds normal.  Musculoskeletal: Normal range of motion.  Neurological: She is alert and oriented to person, place, and time.  Skin: Skin is warm and dry.  Psychiatric: She has a normal mood and affect. Her behavior is normal. Judgment and thought content normal.       Assessment & Plan:  1. Vaginal bleeding - POCT urine pregnancy - POCT CBC Plan: - Ambulatory referral to Gynecology-stat  2. Other acute back pain - POCT urinalysis  dipstick - POCT Microscopic Urinalysis ([facility]) - POCT urine pregnancy - Comprehensive metabolic panel  Plan: Cyclobenzaprine 10 mg up to 3 times daily as needed.  3. Intractable headache, unspecified chronicity pattern, unspecified headache type - TSH Plan: Cyclobenzaprine 10 mg up to 3 times daily as needed.  4. Dizziness - POCT CBC  5. Chest pain, unspecified type - TSH - DG Chest 2 View  6. Tachycardia - EKG 12-Lead Plan: Metoprolol 25 mg once daily as needed for palpitations.  Carroll Sage. Kenton Kingfisher, MSN, FNP-C [facility]

## 2016-06-08 ENCOUNTER — Telehealth: Payer: Self-pay | Admitting: Obstetrics and Gynecology

## 2016-06-08 LAB — COMPREHENSIVE METABOLIC PANEL
A/G RATIO: 1.4 (ref 1.2–2.2)
ALBUMIN: 4.2 g/dL (ref 3.5–5.5)
ALK PHOS: 86 IU/L (ref 39–117)
ALT: 6 IU/L (ref 0–32)
AST: 11 IU/L (ref 0–40)
BUN / CREAT RATIO: 9 (ref 9–23)
BUN: 6 mg/dL (ref 6–24)
CHLORIDE: 107 mmol/L — AB (ref 96–106)
CO2: 22 mmol/L (ref 18–29)
Calcium: 8.7 mg/dL (ref 8.7–10.2)
Creatinine, Ser: 0.67 mg/dL (ref 0.57–1.00)
GFR calc non Af Amer: 110 mL/min/{1.73_m2} (ref >59)
GFR, EST AFRICAN AMERICAN: 127 mL/min/{1.73_m2} (ref >59)
GLUCOSE: 94 mg/dL (ref 65–99)
Globulin, Total: 3.1 g/dL (ref 1.5–4.5)
POTASSIUM: 4.4 mmol/L (ref 3.5–5.2)
Sodium: 143 mmol/L (ref 134–144)
TOTAL PROTEIN: 7.3 g/dL (ref 6.0–8.5)

## 2016-06-08 LAB — TSH: TSH: 0.657 u[IU]/mL (ref 0.450–4.500)

## 2016-06-08 NOTE — Telephone Encounter (Signed)
Called and left a message for patient to call back to schedule a new patient doctor referral.

## 2016-06-12 ENCOUNTER — Ambulatory Visit (INDEPENDENT_AMBULATORY_CARE_PROVIDER_SITE_OTHER): Payer: BLUE CROSS/BLUE SHIELD | Admitting: Obstetrics and Gynecology

## 2016-06-12 ENCOUNTER — Encounter: Payer: Self-pay | Admitting: Obstetrics and Gynecology

## 2016-06-12 VITALS — BP 138/82 | HR 84 | Resp 15 | Ht 66.75 in | Wt 134.0 lb

## 2016-06-12 DIAGNOSIS — G8929 Other chronic pain: Secondary | ICD-10-CM

## 2016-06-12 DIAGNOSIS — F17200 Nicotine dependence, unspecified, uncomplicated: Secondary | ICD-10-CM

## 2016-06-12 DIAGNOSIS — R519 Headache, unspecified: Secondary | ICD-10-CM

## 2016-06-12 DIAGNOSIS — N939 Abnormal uterine and vaginal bleeding, unspecified: Secondary | ICD-10-CM | POA: Diagnosis not present

## 2016-06-12 DIAGNOSIS — Z124 Encounter for screening for malignant neoplasm of cervix: Secondary | ICD-10-CM

## 2016-06-12 DIAGNOSIS — R51 Headache: Secondary | ICD-10-CM | POA: Diagnosis not present

## 2016-06-12 NOTE — Progress Notes (Signed)
41 y.o. [id number] SingleAfrican AmericanF here for a consultation from Molli Barrows, MSN, FNP-C for vaginal bleeding since August.  She had a recent normal TSH and CBC. Negative UPT with her primary. The patient has a nexplanon in place since 2015.  Period Duration (Days): bleeding since August  Period Pattern: (!) Irregular Menstrual Flow: Heavy Menstrual Control: Maxi pad Menstrual Control Change Freq (Hours): changes pad every hour when bleeding heavy  Dysmenorrhea: (!) Severe Dysmenorrhea Symptoms: Cramping  She has been bleeding almost every day since August. Varies from spotting to heavy. More heavy than light. Can go through a pad every 1-2 hours. If she is more active she bleeds heavily. Not sexually active.  No cramps.  Prior to August she was having normal monthly cycles.  In August she hurt her back and has been struggling since then. She also c/o bad daily headaches. Her primary gave her flexeril and ibuprofen, not working.   Patient's last menstrual period was 01/11/2016 (approximate).          Sexually active: No.  The current method of family planning is Nexplanon.    Exercising: Yes.    walking/ PT Smoker:  yes  Health Maintenance: Pap:  09-24-12 WNL NEG HR HPV  History of abnormal Pap:  no MMG:  Never Colonoscopy:  Never BMD:   Never TDaP:  07-31-12 Gardasil: N/A   reports that she has been smoking Cigarettes.  She has a 2.50 pack-year smoking history. She has never used smokeless tobacco. She reports that she drinks alcohol. She reports that she does not use drugs. She is a Librarian, academic, Emergency planning/management officer. She has a 96 year old daughter and a 25 year old daughter. She has a 36 year old grandson. Live near by.   Past Medical History:  Diagnosis Date  . Anemia    iron supplements in the past  . Anxiety    has been prescribed Xanax  . Asthma    has albuterol, advair prn, no attack x 2 years;triggered by strong smells (bleach and smoke)  . Benign breast cyst in female  1995   had bx done was normal  . DUB (dysfunctional uterine bleeding) 1995  . Family hx cleft lip 04/29/2012   FOB's son   . Gestational Hypertension 07/17/2012  . Headache(784.0)    during and prior to pregnancy;usually Rt side of head;can effect vision  . Heart murmur   . Hx of preeclampsia, prior pregnancy, currently pregnant 03/12/2012   Induced at 38wks, no meds    . Infection    Yeast inf;gets freq w/ antibxs or scented soaps  . Infection    BV;recently treated for BV completed Flagyl  . Memory loss 2013   Currently having difficult time remembering things  . 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 Ovarian cyst in pregnancy 2013   seen on recent US  . Preeclampsia 1994   induced @ 38 weeks  . Scoliosis   She is forget, gets confused, emotional, PMS. She also c/o severe headaches.  Her memory issues have been going on since she was pregnant with her 41 year old. Worse in the last few months.  She is on Neurontin for back pain. Thinks it's affecting her mood and memory.  She was recently on Lopressor for palpitations.    Past Surgical History:  Procedure Laterality Date  . DILATION AND EVACUATION N/A 09/29/2012   Procedure: DILATATION AND EVACUATION;  Surgeon: Mora Bellman, MD;  Location:  [facility];  Service: Gynecology;  Laterality: N/A;  . WISDOM TOOTH EXTRACTION  05/2011   all 4 removed    Current Outpatient Prescriptions  Medication Sig Dispense Refill  . albuterol (PROVENTIL HFA;VENTOLIN HFA) 108 (90 BASE) MCG/ACT inhaler Inhale 2 puffs into the lungs every 4 (four) hours as needed for wheezing. 1 Inhaler 2  . cyclobenzaprine (FLEXERIL) 10 MG tablet Take 1 tablet (10 mg total) by mouth 3 (three) times daily as needed for muscle spasms. 30 tablet 0  . gabapentin (NEURONTIN) 300 MG capsule Take 300 mg by mouth 3 (three) times daily.    . hydrOXYzine (VISTARIL) 50 MG capsule Take 1 capsule (50 mg total) by mouth every 6 (six) hours as needed for itching.  30 capsule 0  . ibuprofen (ADVIL,MOTRIN) 800 MG tablet Take 800 mg by mouth every 8 (eight) hours as needed.    . methocarbamol (ROBAXIN) 500 MG tablet Take 1 tablet (500 mg total) by mouth 2 (two) times daily. 20 tablet 0  . metoprolol tartrate (LOPRESSOR) 25 MG tablet Take 1 tablet once daily as needed for palpitations. 180 tablet 3  . Multiple Vitamin (MULTIVITAMIN WITH MINERALS) TABS tablet Take 1 tablet by mouth daily.    . budesonide-formoterol (SYMBICORT) 160-4.5 MCG/ACT inhaler Take 2 puffs first thing in am and then another 2 puffs about 12 hours later. (Patient not taking: Reported on 06/12/2016) 1 Inhaler 12   No current facility-administered medications for this visit.     Family History  Problem Relation Age of Onset  . Cancer Mother     Throat  . Seizures Mother   . Ulcers Mother   . Pancreatitis Mother   . Cancer Father     Throat  . Hypertension Father   . Thyroid disease Father     Hyperthyoid  . Pancreatitis Father   . COPD Father   . Cirrhosis Father     Liver  . Asthma Sister   . Asthma Brother   . Hypertension Paternal Grandmother   . Heart failure Paternal Grandmother   . Hypertension Paternal Aunt   . Diabetes Maternal Aunt   . Asthma Cousin   . Asthma Paternal Aunt   . Stroke Maternal Uncle   . Stroke Paternal Uncle     x 2  . Sickle cell trait Other     Nephew  Mom died at 10 of a MI  Review of Systems  Constitutional: Negative.   HENT: Negative.   Eyes: Negative.   Respiratory: Negative.   Cardiovascular: Negative.   Gastrointestinal: Negative.   Endocrine: Negative.   Genitourinary: Positive for menstrual problem.       Heavy menstrual bleeding since August 2017   Musculoskeletal: Negative.   Skin: Negative.   Allergic/Immunologic: Negative.   Neurological: Negative.   Psychiatric/Behavioral: Negative.     Exam:   BP 138/82 (BP Location: Right Arm, Patient Position: Sitting, Cuff Size: Normal)   Pulse 84   Resp 15   Ht 5' 6.75"  (1.695 m)   Wt 134 lb (60.8 kg)   LMP 01/11/2016 (Approximate)   BMI 21.14 kg/m   Weight change: @WEIGHTCHANGE @ Height:   Height: 5' 6.75" (169.5 cm)  Ht Readings from Last 3 Encounters:  06/12/16 5' 6.75" (1.695 m)  06/07/16 5' 8"  (1.727 m)  01/16/16 5' 8"  (1.727 m)    General appearance: alert, cooperative and appears stated age Head: Normocephalic, without obvious abnormality, atraumatic Neck: no adenopathy, supple, symmetrical, trachea midline and thyroid normal to  inspection and palpation Lungs: clear to auscultation bilaterally Cardiovascular: regular rate and rhythm Breasts: normal appearance, no masses or tenderness Heart: regular rate and rhythm Abdomen: soft, non-tender; bowel sounds normal; no masses,  no organomegaly Extremities: extremities normal, atraumatic, no cyanosis or edema Skin: Skin color, texture, turgor normal. No rashes or lesions Lymph nodes: Cervical, supraclavicular, and axillary nodes normal. No abnormal inguinal nodes palpated Neurologic: Grossly normal Norplant palpated in her right upper arm, 6 rods palpated in her left upper arm c/w h/o norplant insertion 23 years ago (she has been told she didn't need to have it removed).   Pelvic: External genitalia:  no lesions              Urethra:  normal appearing urethra with no masses, tenderness or lesions              Bartholins and Skenes: normal                 Vagina: normal appearing vagina with normal color and discharge, no lesions              Cervix: no lesions               Bimanual Exam:  Uterus:  normal size, contour, position, consistency, mobility, non-tender              Adnexa: no mass, fullness, tenderness               Rectovaginal: Confirms               Anus:  normal sphincter tone, no lesions  Chaperone was present for exam.  A:  Abnormal uterine bleeding on Nexplanon, normal CBC, TSH, negative UPT. Normal exam  C/O bad daily headaches, some issues with her  memory  Smoker    P:   Return for a GYN ultrasound, possible sonohysterogram, possible endometrial biopsy  Pap with hpv  Recommended she get a mammogram (# given)  She will f/u with her primary for her headaches. Given that and her memory loss she will discuss possible Neurology consult with her primary.  Discussed removal of her nexplanon (may be contributing to her bleeding), I would also consider removal of the norplant  CC: Molli Barrows, MSN, FNP-C  Letter sent

## 2016-06-15 ENCOUNTER — Telehealth: Payer: Self-pay | Admitting: Obstetrics and Gynecology

## 2016-06-15 NOTE — Telephone Encounter (Signed)
Spoke with patient regarding benefit for ultrasound. Patient understood. Patient advises she has multiple appointments at this time, due to a Workers' Compensation injury and will call back next week to schedule, but states she probably will not be able to schedule until the week of 06/25/16.   Patient also expressed her desire to pursue an evaluation with a neurologist.  I advised patient I will forward this information to Dr Talbert Nan.  Patient is agreeable.  Routing to Dr Talbert Nan

## 2016-06-18 NOTE — Telephone Encounter (Signed)
That's fine for her ultrasound. I've sent a copy of her note to her primary about possible Neurology consult.  She should call her primary to discuss

## 2016-06-20 NOTE — Addendum Note (Signed)
Addended by: Dorothy Spark on: 06/20/2016 10:46 AM   Modules accepted: Orders

## 2016-06-22 LAB — IPS PAP TEST WITH HPV

## 2016-06-25 NOTE — Telephone Encounter (Signed)
Called patient to follow up and schedule recommended ultrasound. Left message requesting a return call

## 2016-07-02 NOTE — Telephone Encounter (Signed)
Called patient to follow up and schedule recommended ultrasound. Left Voicemail requesting a call back.

## 2016-07-12 NOTE — Telephone Encounter (Signed)
Please call the patient in a month to check on setting up her ultrasound (see prior note)

## 2016-07-12 NOTE — Telephone Encounter (Signed)
Called and spoke with patient to follow up with scheduling recommended ultrasound. Patient continues to decline scheduling.  Patient states, she has a Workers' Compensation injury, and continues to have "issues with Workers' Compensation".  Patient advises she will call back when she is ready to proceed with scheduling.  Routing to Dr Talbert Nan for review  cc: Lamont Snowball

## 2016-07-16 NOTE — Telephone Encounter (Signed)
Defer ultrasound order for one month.   Routing to provider for final review.  Will close encounter.

## 2016-07-26 NOTE — Telephone Encounter (Signed)
Placed patient in recall for [place]

## 2016-08-29 ENCOUNTER — Telehealth: Payer: Self-pay | Admitting: *Deleted

## 2016-08-29 NOTE — Telephone Encounter (Signed)
Follow-up call to patient regarding recommended pelvic ultrasound. See previous phone encounter.  Per ROI can leave message on voice mail. Voice mail has first and last name confirmation. Left message calling to get update on plans for scheduling ultrasound. Call back and ask for [facility] or triage nurse.

## 2016-09-04 NOTE — Telephone Encounter (Signed)
Routing to provider for final review. Patient agreeable to disposition. Will close encounter.

## 2016-09-04 NOTE — Telephone Encounter (Signed)
Routed to Dr Quincy Simmonds by mistake. Routing to Dr Talbert Nan for final review. Encounter closed.

## 2016-09-04 NOTE — Telephone Encounter (Signed)
Called patient to follow up on recommended ultrasound. Patient is ready to move forward with scheduling recommended ultrasound. Patient is scheduled 09/10/16 with Dr Talbert Nan. Patient is aware of date, arrival time and cancellation policy. Patient has not additional questions.  Routing to Dr Talbert Nan  cc: Lamont Snowball

## 2016-09-08 ENCOUNTER — Ambulatory Visit (INDEPENDENT_AMBULATORY_CARE_PROVIDER_SITE_OTHER): Payer: BLUE CROSS/BLUE SHIELD | Admitting: Urgent Care

## 2016-09-08 VITALS — BP 153/90 | HR 99 | Temp 98.3°F | Resp 40 | Ht 66.5 in | Wt 129.4 lb

## 2016-09-08 DIAGNOSIS — M25561 Pain in right knee: Secondary | ICD-10-CM

## 2016-09-08 DIAGNOSIS — M25551 Pain in right hip: Secondary | ICD-10-CM

## 2016-09-08 DIAGNOSIS — R252 Cramp and spasm: Secondary | ICD-10-CM

## 2016-09-08 DIAGNOSIS — M25562 Pain in left knee: Secondary | ICD-10-CM

## 2016-09-08 DIAGNOSIS — M419 Scoliosis, unspecified: Secondary | ICD-10-CM | POA: Diagnosis not present

## 2016-09-08 DIAGNOSIS — G8929 Other chronic pain: Secondary | ICD-10-CM | POA: Diagnosis not present

## 2016-09-08 DIAGNOSIS — M5442 Lumbago with sciatica, left side: Secondary | ICD-10-CM | POA: Diagnosis not present

## 2016-09-08 DIAGNOSIS — M25552 Pain in left hip: Secondary | ICD-10-CM

## 2016-09-08 LAB — POCT URINALYSIS DIP (MANUAL ENTRY)
BILIRUBIN UA: NEGATIVE
Glucose, UA: NEGATIVE
Ketones, POC UA: NEGATIVE
Leukocytes, UA: NEGATIVE
NITRITE UA: NEGATIVE
Protein Ur, POC: NEGATIVE
Spec Grav, UA: 1.01 (ref 1.030–1.035)
Urobilinogen, UA: 1 (ref ?–2.0)
pH, UA: 6 (ref 5.0–8.0)

## 2016-09-08 LAB — POCT CBC
Granulocyte percent: 54.5 %G (ref 37–80)
HEMATOCRIT: 43.5 % (ref 37.7–47.9)
Hemoglobin: 14.4 g/dL (ref 12.2–16.2)
LYMPH, POC: 2.9 (ref 0.6–3.4)
MCH, POC: 30 pg (ref 27–31.2)
MCHC: 33.2 g/dL (ref 31.8–35.4)
MCV: 90.3 fL (ref 80–97)
MID (cbc): 0.5 (ref 0–0.9)
MPV: 8.5 fL (ref 0–99.8)
PLATELET COUNT, POC: 213 10*3/uL (ref 142–424)
POC GRANULOCYTE: 4.1 (ref 2–6.9)
POC LYMPH %: 39.3 % (ref 10–50)
POC MID %: 6.2 %M (ref 0–12)
RBC: 4.81 M/uL (ref 4.04–5.48)
RDW, POC: 13.9 %
WBC: 7.5 10*3/uL (ref 4.6–10.2)

## 2016-09-08 MED ORDER — KETOROLAC TROMETHAMINE 60 MG/2ML IM SOLN
60.0000 mg | Freq: Once | INTRAMUSCULAR | Status: AC
  Administered 2016-09-08: 60 mg via INTRAMUSCULAR

## 2016-09-08 MED ORDER — TRAMADOL HCL 50 MG PO TABS: 50.0000 mg | ORAL_TABLET | Freq: Three times a day (TID) | ORAL | 0 refills | Status: DC | PRN

## 2016-09-08 NOTE — Patient Instructions (Signed)
Leg Cramps Leg cramps occur when a muscle or muscles tighten and you have no control over this tightening (involuntary muscle contraction). Muscle cramps can develop in any muscle, but the most common place is in the calf muscles of the leg. Those cramps can occur during exercise or when you are at rest. Leg cramps are painful, and they may last for a few seconds to a few minutes. Cramps may return several times before they finally stop. Usually, leg cramps are not caused by a serious medical problem. In many cases, the cause is not known. Some common causes include:  Overexertion.  Overuse from repetitive motions, or doing the same thing over and over.  Remaining in a certain position for a long period of time.  Improper preparation, form, or technique while performing a sport or an activity.  Dehydration.  Injury.  Side effects of some medicines.  Abnormally low levels of the salts and ions in your blood (electrolytes), especially potassium and calcium. These levels could be low if you are taking water pills (diuretics) or if you are pregnant. Follow these instructions at home: Watch your condition for any changes. Taking the following actions may help to lessen any discomfort that you are feeling:  Stay well-hydrated. Drink enough fluid to keep your urine clear or pale yellow.  Try massaging, stretching, and relaxing the affected muscle. Do this for several minutes at a time.  For tight or tense muscles, use a warm towel, heating pad, or hot shower water directed to the affected area.  If you are sore or have pain after a cramp, applying ice to the affected area may relieve discomfort.  Put ice in a plastic bag.  Place a towel between your skin and the bag.  Leave the ice on for 20 minutes, 2-3 times per day.  Avoid strenuous exercise for several days if you have been having frequent leg cramps.  Make sure that your diet includes the essential minerals for your muscles to  work normally.  Take medicines only as directed by your health care provider. Contact a health care provider if:  Your leg cramps get more severe or more frequent, or they do not improve over time.  Your foot becomes cold, numb, or blue. This information is not intended to replace advice given to you by your health care provider. Make sure you discuss any questions you have with your health care provider. Document Released: 07/05/2004 Document Revised: 11/03/2015 Document Reviewed: 05/05/2014 Elsevier Interactive Patient Education  2017 Elsevier Inc.    Hip Pain The hip is the joint between the upper legs and the lower pelvis. The bones, cartilage, tendons, and muscles of your hip joint support your body and allow you to move around. Hip pain can range from a minor ache to severe pain in one or both of your hips. The pain may be felt on the inside of the hip joint near the groin, or the outside near the buttocks and upper thigh. You may also have swelling or stiffness. Follow these instructions at home: Managing pain, stiffness, and swelling   If directed, apply ice to the injured area.  Put ice in a plastic bag.  Place a towel between your skin and the bag.  Leave the ice on for 20 minutes, 2-3 times a day  Sleep with a pillow between your legs on your most comfortable side.  Avoid any activities that cause pain. General instructions   Take over-the-counter and prescription medicines only as told by  your health care provider.  Do any exercises as told by your health care provider.  Record the following:  How often you have hip pain.  The location of your pain.  What the pain feels like.  What makes the pain worse.  Keep all follow-up visits as told by your health care provider. This is important. Contact a health care provider if:  You cannot put weight on your leg.  Your pain or swelling continues or gets worse after one week.  It gets harder to walk.  You  have a fever. Get help right away if:  You fall.  You have a sudden increase in pain and swelling in your hip.  Your hip is red or swollen or very tender to touch. Summary  Hip pain can range from a minor ache to severe pain in one or both of your hips.  The pain may be felt on the inside of the hip joint near the groin, or the outside near the buttocks and upper thigh.  Avoid any activities that cause pain.  Record how often you have hip pain, the location of the pain, what makes it worse and what it feels like. This information is not intended to replace advice given to you by your health care provider. Make sure you discuss any questions you have with your health care provider. Document Released: 11/15/2009 Document Revised: 04/30/2016 Document Reviewed: 04/30/2016 Elsevier Interactive Patient Education  2017 Elsevier Inc.     Sciatica Sciatica is pain, numbness, weakness, or tingling along the path of the sciatic nerve. The sciatic nerve starts in the lower back and runs down the back of each leg. The nerve controls the muscles in the lower leg and in the back of the knee. It also provides feeling (sensation) to the back of the thigh, the lower leg, and the sole of the foot. Sciatica is a symptom of another medical condition that pinches or puts pressure on the sciatic nerve. Generally, sciatica only affects one side of the body. Sciatica usually goes away on its own or with treatment. In some cases, sciatica may keep coming back (recur). What are the causes? This condition is caused by pressure on the sciatic nerve, or pinching of the sciatic nerve. This may be the result of:  A disk in between the bones of the spine (vertebrae) bulging out too far (herniated disk).  Age-related changes in the spinal disks (degenerative disk disease).  A pain disorder that affects a muscle in the buttock (piriformis syndrome).  Extra bone growth (bone spur) near the sciatic nerve.  An  injury or break (fracture) of the pelvis.  Pregnancy.  Tumor (rare). What increases the risk? The following factors may make you more likely to develop this condition:  Playing sports that place pressure or stress on the spine, such as football or weight lifting.  Having poor strength and flexibility.  A history of back injury.  A history of back surgery.  Sitting for long periods of time.  Doing activities that involve repetitive bending or lifting.  Obesity. What are the signs or symptoms? Symptoms can vary from mild to very severe, and they may include:  Any of these problems in the lower back, leg, hip, or buttock:  Mild tingling or dull aches.  Burning sensations.  Sharp pains.  Numbness in the back of the calf or the sole of the foot.  Leg weakness.  Severe back pain that makes movement difficult. These symptoms may get worse when  you cough, sneeze, or laugh, or when you sit or stand for long periods of time. Being overweight may also make symptoms worse. In some cases, symptoms may recur over time. How is this diagnosed? This condition may be diagnosed based on:  Your symptoms.  A physical exam. Your health care provider may ask you to do certain movements to check whether those movements trigger your symptoms.  You may have tests, including:  Blood tests.  X-rays.  MRI.  CT scan. How is this treated? In many cases, this condition improves on its own, without any treatment. However, treatment may include:  Reducing or modifying physical activity during periods of pain.  Exercising and stretching to strengthen your abdomen and improve the flexibility of your spine.  Icing and applying heat to the affected area.  Medicines that help:  To relieve pain and swelling.  To relax your muscles.  Injections of medicines that help to relieve pain, irritation, and inflammation around the sciatic nerve (steroids).  Surgery. Follow these instructions  at home: Medicines   Take over-the-counter and prescription medicines only as told by your health care provider.  Do not drive or operate heavy machinery while taking prescription pain medicine. Managing pain   If directed, apply ice to the affected area.  Put ice in a plastic bag.  Place a towel between your skin and the bag.  Leave the ice on for 20 minutes, 2-3 times a day.  After icing, apply heat to the affected area before you exercise or as often as told by your health care provider. Use the heat source that your health care provider recommends, such as a moist heat pack or a heating pad.  Place a towel between your skin and the heat source.  Leave the heat on for 20-30 minutes.  Remove the heat if your skin turns bright red. This is especially important if you are unable to feel pain, heat, or cold. You may have a greater risk of getting burned. Activity   Return to your normal activities as told by your health care provider. Ask your health care provider what activities are safe for you.  Avoid activities that make your symptoms worse.  Take brief periods of rest throughout the day. Resting in a lying or standing position is usually better than sitting to rest.  When you rest for longer periods, mix in some mild activity or stretching between periods of rest. This will help to prevent stiffness and pain.  Avoid sitting for long periods of time without moving. Get up and move around at least one time each hour.  Exercise and stretch regularly, as told by your health care provider.  Do not lift anything that is heavier than 10 lb (4.5 kg) while you have symptoms of sciatica. When you do not have symptoms, you should still avoid heavy lifting, especially repetitive heavy lifting.  When you lift objects, always use proper lifting technique, which includes:  Bending your knees.  Keeping the load close to your body.  Avoiding twisting. General instructions   Use  good posture.  Avoid leaning forward while sitting.  Avoid hunching over while standing.  Maintain a healthy weight. Excess weight puts extra stress on your back and makes it difficult to maintain good posture.  Wear supportive, comfortable shoes. Avoid wearing high heels.  Avoid sleeping on a mattress that is too soft or too hard. A mattress that is firm enough to support your back when you sleep may help to reduce  your pain.  Keep all follow-up visits as told by your health care provider. This is important. Contact a health care provider if:  You have pain that wakes you up when you are sleeping.  You have pain that gets worse when you lie down.  Your pain is worse than you have experienced in the past.  Your pain lasts longer than 4 weeks.  You experience unexplained weight loss. Get help right away if:  You lose control of your bowel or bladder (incontinence).  You have:  Weakness in your lower back, pelvis, buttocks, or legs that gets worse.  Redness or swelling of your back.  A burning sensation when you urinate. This information is not intended to replace advice given to you by your health care provider. Make sure you discuss any questions you have with your health care provider. Document Released: 05/22/2001 Document Revised: 11/01/2015 Document Reviewed: 02/04/2015 Elsevier Interactive Patient Education  2017 [employer].

## 2016-09-08 NOTE — Progress Notes (Signed)
MRN: [id number] DOB: 1975/07/01  Subjective:   Anne Olson is a 41 y.o. female presenting for chief complaint of leg cramps (both legs since 3 am-seems some better now. Also has back & buttock pain)  Reports sudden onset of bilateral leg pain, cramping, spasms in her thighs into her lower legs. Has sciatica and herniated disc of her back, received steroid injection on 09/04/2016 with Dr. Maxie Better, [place]. Has tried Flexeril, Neurontin and ibuprofen without any relief. Tries to hydrate with water, cranberry juice. Walks daily, is active and stays on her feet a lot. Denies fever, trauma, loss of motor control, numbness, incontinence, rashes. Denies history of kidney disease.  Anne Olson has a current medication list which includes the following prescription(s): albuterol, cyclobenzaprine, gabapentin, hydroxyzine, ibuprofen, lidocaine, methocarbamol, metoprolol tartrate, multivitamin with minerals, and budesonide-formoterol. Also is allergic to bee venom and tomato.  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 Also  has a past surgical history that includes Wisdom tooth extraction (05/2011) and Dilation and evacuation (N/A, 09/29/2012).  Objective:   Vitals: BP (!) 153/90   Pulse 99   Temp 98.3 F (36.8 C) (Oral)   Resp (!) 40   Ht 5' 6.5" (1.689 m)   Wt 129 lb 6 oz (58.7 kg)   SpO2 99%   BMI 20.57 kg/m   Physical Exam  Constitutional: She is oriented to person, place, and time. She appears well-developed and well-nourished.  Cardiovascular: Normal rate.   Pulmonary/Chest: Effort  normal.  Musculoskeletal:       Right knee: She exhibits normal range of motion, no swelling, no ecchymosis and no erythema. No tenderness found.       Left knee: She exhibits normal range of motion, no swelling, no ecchymosis and no erythema. No tenderness found.  Neurological: She is alert and oriented to person, place, and time. She displays normal reflexes (Patellar and Achilles tendons intact). She exhibits normal muscle tone. Coordination (wincing and rising gingerly from sitting position) abnormal.  Skin: Skin is warm and dry.   Results for orders placed or performed in visit on 09/08/16 (from the past 24 hour(s))  POCT urinalysis dipstick     Status: Abnormal   Collection Time: 09/08/16  3:50 PM  Result Value Ref Range   Color, UA yellow yellow   Clarity, UA clear clear   Glucose, UA negative negative   Bilirubin, UA negative negative   Ketones, POC UA negative negative   Spec Grav, UA 1.010 1.030 - 1.035   Blood, UA trace-lysed (A) negative   pH, UA 6.0 5.0 - 8.0   Protein Ur, POC negative negative   Urobilinogen, UA 1.0 Negative - 2.0   Nitrite, UA Negative Negative   Leukocytes, UA Negative Negative  POCT CBC     Status: None   Collection Time: 09/08/16  4:04 PM  Result Value Ref Range   WBC 7.5 4.6 - 10.2 K/uL   Lymph, poc 2.9 0.6 - 3.4   POC LYMPH PERCENT 39.3 10 - 50 %L   MID (cbc) 0.5 0 - 0.9   POC MID % 6.2 0 - 12 %M   POC Granulocyte 4.1 2 - 6.9   Granulocyte percent 54.5 37 - 80 %G  RBC 4.81 4.04 - 5.48 M/uL   Hemoglobin 14.4 12.2 - 16.2 g/dL   HCT, POC 43.5 37.7 - 47.9 %   MCV 90.3 80 - 97 fL   MCH, POC 30.0 27 - 31.2 pg   MCHC 33.2 31.8 - 35.4 g/dL   RDW, POC 13.9 %   Platelet Count, POC 213 142 - 424 K/uL   MPV 8.5 0 - 99.8 fL   Assessment and Plan :   1. Leg cramps 2. Arthralgia of both lower legs 3. Chronic bilateral low back pain with left-sided sciatica 4. Scoliosis, unspecified scoliosis type, unspecified spinal region 5. Pain of both hip  joints - Labs pending, unclear etiology. Her symptoms are most likely related to her sciatica, ruptured disc, scoliosis. Offered IM Toradol in clinic. Maintain Neurontin, Flexeril. Use Tramadol for breakthrough pain. Patient will get in touch with her orthopedist Monday morning for a recheck.  Jaynee Eagles, PA-C [facility] [phone] 09/08/2016  3:21 PM

## 2016-09-09 LAB — CK: Total CK: 66 U/L (ref 24–173)

## 2016-09-09 LAB — BASIC METABOLIC PANEL
BUN/Creatinine Ratio: 8 — ABNORMAL LOW (ref 9–23)
BUN: 6 mg/dL (ref 6–24)
CALCIUM: 9.5 mg/dL (ref 8.7–10.2)
CO2: 19 mmol/L (ref 18–29)
CREATININE: 0.76 mg/dL (ref 0.57–1.00)
Chloride: 103 mmol/L (ref 96–106)
GFR calc Af Amer: 113 mL/min/{1.73_m2} (ref >59)
GFR calc non Af Amer: 98 mL/min/{1.73_m2} (ref >59)
GLUCOSE: 88 mg/dL (ref 65–99)
Potassium: 4.3 mmol/L (ref 3.5–5.2)
SODIUM: 141 mmol/L (ref 134–144)

## 2016-09-11 ENCOUNTER — Ambulatory Visit (INDEPENDENT_AMBULATORY_CARE_PROVIDER_SITE_OTHER): Payer: BLUE CROSS/BLUE SHIELD

## 2016-09-11 ENCOUNTER — Other Ambulatory Visit: Payer: Self-pay | Admitting: Obstetrics and Gynecology

## 2016-09-11 ENCOUNTER — Encounter: Payer: Self-pay | Admitting: Obstetrics and Gynecology

## 2016-09-11 ENCOUNTER — Ambulatory Visit (INDEPENDENT_AMBULATORY_CARE_PROVIDER_SITE_OTHER): Payer: BLUE CROSS/BLUE SHIELD | Admitting: Obstetrics and Gynecology

## 2016-09-11 VITALS — BP 122/80 | HR 72 | Resp 14 | Wt 132.0 lb

## 2016-09-11 DIAGNOSIS — Z978 Presence of other specified devices: Secondary | ICD-10-CM

## 2016-09-11 DIAGNOSIS — N921 Excessive and frequent menstruation with irregular cycle: Secondary | ICD-10-CM

## 2016-09-11 DIAGNOSIS — N939 Abnormal uterine and vaginal bleeding, unspecified: Secondary | ICD-10-CM

## 2016-09-11 DIAGNOSIS — Z975 Presence of (intrauterine) contraceptive device: Secondary | ICD-10-CM

## 2016-09-11 NOTE — Progress Notes (Signed)
GYNECOLOGY  VISIT   HPI: 41 y.o.   Single  African American  female   [id number] with Patient's last menstrual period was 09/06/2016.   here for follow up abnormal uterine bleeding. The patient has a nexplanon, placed in 2015 and has been bleeding most of the time since August. She has had a normal exam, normal CBC, normal TSH. Not currently sexually active. She is a smoker and can't go on OCP's.  If she is sexually active, it will be with a woman.  The bleeding started after an injury in August at work. She had 2 ruptured disc's from the injury, has had 2 epidurals for pain.   She also has norplant in her arm for 23 years. Was told she didn't need it out.   GYNECOLOGIC HISTORY: Patient's last menstrual period was 09/06/2016. Contraception:Nexplanon  Menopausal hormone therapy: na        OB History    Gravida Para Term Preterm AB Living   3 2 2  0 1 2   SAB TAB Ectopic Multiple Live Births   0 1 0 0 2         Patient Active Problem List   Diagnosis Date Noted  . CAP (community acquired pneumonia) 05/19/2015  . Acute respiratory failure with hypoxia ([place]) 05/19/2015  . Hypokalemia 05/19/2015  . Normocytic normochromic anemia 05/19/2015  . Pneumonia 05/19/2015  . Nexplanon insertion 10/27/2012  . Retained products of conception, postpartum 09/29/2012  . 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 Elevated transaminase level 06/23/2012  . Anemia 06/09/2012  . 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 Dyspnea 05/16/2012  . Abnormal CXR 05/16/2012  . Scoliosis 04/29/2012  . Asthma 03/12/2012  . Smoker 03/12/2012  . Anxiety 03/12/2012    Past Medical History:  Diagnosis Date  . Anemia    iron supplements in the past  . Anxiety    has been prescribed Xanax  . Asthma    has albuterol, advair prn, no attack x 2 years;triggered by strong smells (bleach and smoke)  . Benign breast cyst in female 1995   had bx done was normal  . DUB (dysfunctional uterine bleeding) 1995  . Family hx cleft lip  04/29/2012   FOB's son   . Gestational Hypertension 07/17/2012  . Headache(784.0)    during and prior to pregnancy;usually Rt side of head;can effect vision  . Heart murmur   . Hx of preeclampsia, prior pregnancy, currently pregnant 03/12/2012   Induced at 38wks, no meds    . Infection    Yeast inf;gets freq w/ antibxs or scented soaps  . Infection    BV;recently treated for BV completed Flagyl  . Memory loss 2013   Currently having difficult time remembering things  . 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 Ovarian cyst in pregnancy 2013   seen on recent US  . Preeclampsia 1994   induced @ 38 weeks  . Scoliosis     Past Surgical History:  Procedure Laterality Date  . DILATION AND EVACUATION N/A 09/29/2012   Procedure: DILATATION AND EVACUATION;  Surgeon: Mora Bellman, MD;  Location: [facility];  Service: Gynecology;  Laterality: N/A;  . WISDOM TOOTH EXTRACTION  05/2011   all 4 removed    Current Outpatient Prescriptions  Medication Sig Dispense Refill  . albuterol (PROVENTIL HFA;VENTOLIN HFA) 108 (90 BASE) MCG/ACT inhaler Inhale 2 puffs into the lungs every 4 (four) hours as needed for wheezing. 1 Inhaler 2  . budesonide-formoterol (  SYMBICORT) 160-4.5 MCG/ACT inhaler Take 2 puffs first thing in am and then another 2 puffs about 12 hours later. 1 Inhaler 12  . cyclobenzaprine (FLEXERIL) 10 MG tablet Take 1 tablet (10 mg total) by mouth 3 (three) times daily as needed for muscle spasms. 30 tablet 0  . etonogestrel (NEXPLANON) 68 MG IMPL implant 1 each by Subdermal route once.    . gabapentin (NEURONTIN) 300 MG capsule Take 300 mg by mouth 3 (three) times daily.    . hydrOXYzine (VISTARIL) 50 MG capsule Take 1 capsule (50 mg total) by mouth every 6 (six) hours as needed for itching. 30 capsule 0  . ibuprofen (ADVIL,MOTRIN) 800 MG tablet Take 800 mg by mouth every 8 (eight) hours as needed.    . lidocaine (LIDODERM) 5 % Place 1 patch onto the skin daily. Remove &  Discard patch within 12 hours or as directed by MD    . methocarbamol (ROBAXIN) 500 MG tablet Take 1 tablet (500 mg total) by mouth 2 (two) times daily. 20 tablet 0  . metoprolol tartrate (LOPRESSOR) 25 MG tablet Take 1 tablet once daily as needed for palpitations. 180 tablet 3  . Multiple Vitamin (MULTIVITAMIN WITH MINERALS) TABS tablet Take 1 tablet by mouth daily.    . traMADol (ULTRAM) 50 MG tablet Take 1 tablet (50 mg total) by mouth every 8 (eight) hours as needed. 30 tablet 0   No current facility-administered medications for this visit.      ALLERGIES: Bee venom and Tomato  Family History  Problem Relation Age of Onset  . Cancer Mother     Throat  . Seizures Mother   . Ulcers Mother   . Pancreatitis Mother   . Cancer Father     Throat  . Hypertension Father   . Thyroid disease Father     Hyperthyoid  . Pancreatitis Father   . COPD Father   . Cirrhosis Father     Liver  . Asthma Sister   . Asthma Brother   . Hypertension Paternal Grandmother   . Heart failure Paternal Grandmother   . Hypertension Paternal Aunt   . Diabetes Maternal Aunt   . Asthma Cousin   . Asthma Paternal Aunt   . Stroke Maternal Uncle   . Stroke Paternal Uncle     x 2  . Sickle cell trait Other     Nephew    Social History   Social History  . Marital status: Single    Spouse name: N/A  . Number of children: 1  . Years of education: 81   Occupational History  . Resident Care Assistant/Med tech    Social History Main Topics  . Smoking status: Current Every Day Smoker    Packs/day: 0.25    Years: 10.00    Types: Cigarettes  . Smokeless tobacco: Never Used  . Alcohol use Yes     Comment: Occasionally  . Drug use: No  . Sexual activity: No     Comment: Has Norplant in Lt arm that is still in place - placed in 1994   Other Topics Concern  . Not on file   Social History Narrative  . No narrative on file    Review of Systems  Cardiovascular: Positive for chest pain,  palpitations and leg swelling.  Gastrointestinal:       Bloating  Genitourinary:       Breast tenderness  Excess menstrual bleeding Unscheduled menstrual bleeding   Musculoskeletal: Positive for joint pain and  myalgias.  Neurological: Positive for headaches.       Difficulty with memory and speech    PHYSICAL EXAMINATION:    BP 122/80 (BP Location: Right Arm, Patient Position: Sitting, Cuff Size: Normal)   Pulse 72   Resp 14   Wt 132 lb (59.9 kg)   LMP 09/06/2016   BMI 20.99 kg/m     General appearance: alert, cooperative and appears stated age Norplant rods palpated in her left upper arm (nexplanon is on the right)  Ultrasound images reviewed with the patient  ASSESSMENT Abnormal bleeding on nexplanon, normal ultrasound, normal CBC, normal TSH. Not sexually active (when is plans to be with a woman)    PLAN Return for nexplanon removal Discussed removal of the norplant as well Not needing contraception Will calendar her bleeding off of the nexplanon   An After Visit Summary was printed and given to the patient.

## 2016-09-12 ENCOUNTER — Telehealth: Payer: Self-pay | Admitting: Obstetrics and Gynecology

## 2016-09-12 NOTE — Telephone Encounter (Signed)
Dr. Talbert Nan, forwarding for review and advise.

## 2016-09-12 NOTE — Telephone Encounter (Signed)
Awaiting information from the manufacturer. Gay Filler is waiting for the call. Will route to [facility]

## 2016-09-12 NOTE — Telephone Encounter (Signed)
Call to patient to review benefit for implanon removal. Patient prefers to schedule at the end of the month to allow benefits to refresh from recent visits.   Patient scheduled for 10-04-16 with Dr. Talbert Nan. She notes Dr. Talbert Nan is 'researching removing my Norplant that I've had over 20 years' and anticipates a call to discuss. She states she is ok waiting for the removal because she's experienced her abnormal bleeding for months.   Patient agreeable to call from nurse to discuss.

## 2016-09-19 NOTE — Telephone Encounter (Signed)
Left message to call Kaitlyn at [phone]. 

## 2016-09-19 NOTE — Telephone Encounter (Signed)
According to the manufacturer, all norplants expired by 2004, it was recommended they all be removed by 2009.There is no long term safety data and they have been removed from the Data Base. Please let the patient know and have her set up removal of both the norplant and nexplanon. Please put her appointment at the end of the day.

## 2016-09-21 NOTE — Telephone Encounter (Signed)
Patient returning your call.

## 2016-09-21 NOTE — Telephone Encounter (Signed)
Spoke with patient. Advised of message as seen below from [place]. Patient is agreeable and verbalizes understanding. Appointment scheduled for 10/04/2016 at 3:30 pm with Dr.Jertson for nexplanon and norplant removal. Patient is agreeable to date and time.  Will need precert for Norplant removal.   Routing to provider for final review. Patient agreeable to disposition. Will close encounter.

## 2016-09-21 NOTE — Telephone Encounter (Signed)
Left message to call Kaitlyn at [phone]. 

## 2016-10-01 ENCOUNTER — Telehealth: Payer: Self-pay | Admitting: Obstetrics and Gynecology

## 2016-10-01 NOTE — Telephone Encounter (Signed)
Patient agreeable to fulfill current obligation and defer nexplanon/norplant removal until 10/2016. Patient requests to recheck benefit 10-23-16 and proceed with scheduling 1 week after that because she is moving houses the week of 10-23-16.  Routing to provider for review prior to closing.

## 2016-10-01 NOTE — Telephone Encounter (Signed)
After previous benefit call, patient requested her benefits rechecked and call again to notify of any updates or changes. Rechecked benefits for Nexplanon removal and Norplant removal. Reviewed with patient who verbalized understanding. Patient states she would prefer to meet a current obligation prior to moving forward with removal of Nexplanon and Norplant. Agreeable to return call with plan after discussing with Nurse.  Reviewed with clinical supervisor and provider and based on patients medical needs, ok to defer one month as discussed with patient.  Call to patient to review. Left voicemail noting cancelled appointment from 10-04-16 and request to return call to review plan and reschedule removal.   Forwarding to provider for review.

## 2016-10-04 ENCOUNTER — Ambulatory Visit: Payer: BLUE CROSS/BLUE SHIELD | Admitting: Obstetrics and Gynecology

## 2016-10-06 ENCOUNTER — Other Ambulatory Visit: Payer: Self-pay | Admitting: Urgent Care

## 2016-10-06 DIAGNOSIS — M5442 Lumbago with sciatica, left side: Secondary | ICD-10-CM

## 2016-10-06 DIAGNOSIS — M25562 Pain in left knee: Principal | ICD-10-CM

## 2016-10-06 DIAGNOSIS — M25552 Pain in left hip: Secondary | ICD-10-CM

## 2016-10-06 DIAGNOSIS — M25551 Pain in right hip: Secondary | ICD-10-CM

## 2016-10-06 DIAGNOSIS — G8929 Other chronic pain: Secondary | ICD-10-CM

## 2016-10-06 DIAGNOSIS — M25561 Pain in right knee: Secondary | ICD-10-CM

## 2017-02-07 ENCOUNTER — Encounter: Payer: Self-pay | Admitting: Obstetrics and Gynecology

## 2017-06-12 ENCOUNTER — Other Ambulatory Visit: Payer: Self-pay | Admitting: Family Medicine

## 2017-06-13 NOTE — Telephone Encounter (Signed)
Refill provided, office visit needed for additional refills.

## 2017-10-21 ENCOUNTER — Other Ambulatory Visit: Payer: Self-pay | Admitting: Family Medicine

## 2017-10-22 NOTE — Telephone Encounter (Signed)
Needs office visit for additional refills.  Please schedule patient.

## 2020-05-02 ENCOUNTER — Other Ambulatory Visit (HOSPITAL_COMMUNITY)
Admission: RE | Admit: 2020-05-02 | Discharge: 2020-05-02 | Disposition: A | Discharge status: Home / Self Care | Payer: Medicaid Other | Source: Ambulatory Visit | Attending: Oral Surgery | Admitting: Oral Surgery

## 2020-05-02 DIAGNOSIS — Z20822 Contact with and (suspected) exposure to covid-19: Secondary | ICD-10-CM | POA: Diagnosis not present

## 2020-05-02 DIAGNOSIS — Z01812 Encounter for preprocedural laboratory examination: Secondary | ICD-10-CM | POA: Diagnosis not present

## 2020-05-02 LAB — SARS CORONAVIRUS 2 (TAT 6-24 HRS): SARS Coronavirus 2: NEGATIVE

## 2020-05-02 NOTE — H&P (Signed)
HISTORY AND PHYSICAL  Anne Olson is a 44 y.o. female patient with CC: bad teeth.  No diagnosis found.  Past Medical History:  Diagnosis Date  . Anemia    iron supplements in the past  . Anxiety    has been prescribed Xanax  . Asthma    has albuterol, advair prn, no attack x 2 years;triggered by strong smells (bleach and smoke)  . Benign breast cyst in female 1995   had bx done was normal  . DUB (dysfunctional uterine bleeding) 1995  . Family hx cleft lip 04/29/2012   FOB's son   . Gestational Hypertension 07/17/2012  . Headache(784.0)    during and prior to pregnancy;usually Rt side of head;can effect vision  . Heart murmur   . Hx of preeclampsia, prior pregnancy, currently pregnant 03/12/2012   Induced at 38wks, no meds    . Infection    Yeast inf;gets freq w/ antibxs or scented soaps  . Infection    BV;recently treated for BV completed Flagyl  . Memory loss 2013   Currently having difficult time remembering things  . 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 Ovarian cyst in pregnancy 2013   seen on recent US  . Preeclampsia 1994   induced @ 38 weeks  . Scoliosis     No current facility-administered medications for this encounter.   Current Outpatient Medications  Medication Sig Dispense Refill  . albuterol (PROVENTIL HFA;VENTOLIN HFA) 108 (90 BASE) MCG/ACT inhaler Inhale 2 puffs into the lungs every 4 (four) hours as needed for wheezing. 1 Inhaler 2  . budesonide-formoterol (SYMBICORT) 160-4.5 MCG/ACT inhaler Take 2 puffs first thing in am and then another 2 puffs about 12 hours later. (Patient taking differently: Inhale 2 puffs into the lungs in the morning and at bedtime. ) 1 Inhaler 12  . buPROPion (WELLBUTRIN SR) 150 MG 12 hr tablet Take 150 mg by mouth 2 (two) times daily.    . cyclobenzaprine (FLEXERIL) 10 MG tablet Take 1 tablet (10 mg total) by mouth at bedtime as needed for muscle spasms. Office visit needed for additional refills. 1st  notice. (Patient taking differently: Take 10 mg by mouth at bedtime as needed for muscle spasms. ) 15 tablet 0  . etonogestrel (NEXPLANON) 68 MG IMPL implant 1 each by Subdermal route once.    . fluticasone (FLONASE) 50 MCG/ACT nasal spray Place 2 sprays into both nostrils daily.    Marland Kitchen gabapentin (NEURONTIN) 300 MG capsule Take 300 mg by mouth 3 (three) times daily as needed (Pain).     Marland Kitchen ibuprofen (ADVIL,MOTRIN) 800 MG tablet Take 800 mg by mouth every 8 (eight) hours as needed for mild pain or moderate pain.     Marland Kitchen levocetirizine (XYZAL) 5 MG tablet Take 5 mg by mouth daily.    Marland Kitchen lidocaine (LIDODERM) 5 % Place 1 patch onto the skin every 12 (twelve) hours as needed (Back pain). Remove & Discard patch within 12 hours or as directed by MD     . methocarbamol (ROBAXIN) 500 MG tablet Take 1 tablet (500 mg total) by mouth 2 (two) times daily. (Patient taking differently: Take 500 mg by mouth 3 (three) times daily as needed for muscle spasms (Back pain). ) 20 tablet 0  . metoprolol tartrate (LOPRESSOR) 25 MG tablet Take 1 tablet once daily as needed for palpitations. (Patient taking differently: Take 25 mg by mouth daily as needed (palpitations.). ) 180 tablet 3  . traMADol (  ULTRAM) 50 MG tablet Take 1 tablet (50 mg total) by mouth every 8 (eight) hours as needed. (Patient taking differently: Take 50 mg by mouth every 8 (eight) hours as needed for moderate pain. ) 30 tablet 0   Allergies  Allergen Reactions  . Bee Venom Anaphylaxis and Swelling    Throat swells  . Tomato Anaphylaxis and Hives    & throat swelling   Active Problems:   * No active hospital problems. *  Vitals: currently breastfeeding. Lab results:No results found for this or any previous visit (from the past 27 hour(s)). Radiology Results: No results found. General appearance: alert, cooperative and no distress Head: Normocephalic, without obvious abnormality, atraumatic Eyes: negative Nose: Nares normal. Septum midline. Mucosa  normal. No drainage or sinus tenderness. Throat: Multiple decayed teeth, severe dental staining. No purulence, edema, fluctuance, trismus. Hyperplastic left maxillary tuberosity. Neck: no adenopathy and supple, symmetrical, trachea midline Resp: clear to auscultation bilaterally Cardio: regular rate and rhythm, S1, S2 normal, no murmur, click, rub or gallop  Assessment: Non-restorable teeth secondary to dental caries. Hyperplastic left maxillary tuberosity.  Plan: Multiple dental extractions with alveoloplasty. Reduction hyperplastic left maxillary tuberosity. GA, Day surgery.   Diona Browner 05/02/2020

## 2020-05-03 ENCOUNTER — Encounter (HOSPITAL_COMMUNITY): Payer: Self-pay | Admitting: Oral Surgery

## 2020-05-03 NOTE — Anesthesia Preprocedure Evaluation (Addendum)
Anesthesia Evaluation  Patient identified by MRN, date of birth, ID band Patient awake    Reviewed: Allergy & Precautions, NPO status , Patient's Chart, lab work & pertinent test results  History of Anesthesia Complications Negative for: history of anesthetic complications  Airway Mallampati: II  TM Distance: >3 FB Neck ROM: Full    Dental  (+) Dental Advisory Given, Poor Dentition, Chipped   Pulmonary asthma , Current SmokerPatient did not abstain from smoking.,    Pulmonary exam normal        Cardiovascular Normal cardiovascular exam   PRN metoprolol for palpitations, none used for over one month    Neuro/Psych  Headaches, PSYCHIATRIC DISORDERS Anxiety Depression    GI/Hepatic negative GI ROS, Neg liver ROS,   Endo/Other  negative endocrine ROS  Renal/GU negative Renal ROS     Musculoskeletal  Scoliosis    Abdominal   Peds  Hematology negative hematology ROS (+)   Anesthesia Other Findings Covid test negative HSV  Reproductive/Obstetrics                           Anesthesia Physical Anesthesia Plan  ASA: II  Anesthesia Plan: General   Post-op Pain Management:    Induction: Intravenous  PONV Risk Score and Plan: 3 and Treatment may vary due to age or medical condition, Ondansetron, Dexamethasone and Midazolam  Airway Management Planned: Nasal ETT  Additional Equipment: None  Intra-op Plan:   Post-operative Plan: Extubation in OR  Informed Consent: I have reviewed the patients History and Physical, chart, labs and discussed the procedure including the risks, benefits and alternatives for the proposed anesthesia with the patient or authorized representative who has indicated his/her understanding and acceptance.     Dental advisory given  Plan Discussed with: CRNA and Anesthesiologist  Anesthesia Plan Comments:        Anesthesia Quick Evaluation

## 2020-05-03 NOTE — Progress Notes (Signed)
PCP:  Dr. Ferrel Logan Cardiologist:  Denies  EKG:  DOS CXR:  06/07/16 ECHO:  Denies Stress Test:  Denies Cardiac Cath:  Denies  Fasting Blood Sugar-  N/A Checks Blood Sugar__N/A_ times a day  OSA/CPAP:  No  ASA/Blood Thinners:  No  Covid test negative 11/22  Anesthesia Review:  NO  Patient denies shortness of breath, fever, cough, and chest pain at PAT appointment.  Patient verbalized understanding of instructions provided today at the PAT appointment.  Patient asked to review instructions at home and day of surgery.

## 2020-05-04 ENCOUNTER — Encounter (HOSPITAL_COMMUNITY)
Admission: RE | Disposition: A | Discharge status: Home / Self Care | Payer: Self-pay | Source: Home / Self Care | Attending: Oral Surgery

## 2020-05-04 ENCOUNTER — Ambulatory Visit (HOSPITAL_COMMUNITY): Payer: Medicaid Other | Admitting: Anesthesiology

## 2020-05-04 ENCOUNTER — Encounter (HOSPITAL_COMMUNITY): Payer: Self-pay | Admitting: Cardiology

## 2020-05-04 ENCOUNTER — Other Ambulatory Visit: Payer: Self-pay

## 2020-05-04 ENCOUNTER — Ambulatory Visit (HOSPITAL_COMMUNITY)
Admission: RE | Admit: 2020-05-04 | Discharge: 2020-05-04 | Disposition: A | Discharge status: Home / Self Care | Payer: Medicaid Other | Attending: Oral Surgery | Admitting: Oral Surgery

## 2020-05-04 ENCOUNTER — Encounter (HOSPITAL_COMMUNITY): Payer: Self-pay | Admitting: Oral Surgery

## 2020-05-04 DIAGNOSIS — K029 Dental caries, unspecified: Secondary | ICD-10-CM | POA: Diagnosis not present

## 2020-05-04 DIAGNOSIS — K056 Periodontal disease, unspecified: Secondary | ICD-10-CM | POA: Insufficient documentation

## 2020-05-04 DIAGNOSIS — M2607 Excessive tuberosity of jaw: Secondary | ICD-10-CM | POA: Diagnosis not present

## 2020-05-04 DIAGNOSIS — Z7951 Long term (current) use of inhaled steroids: Secondary | ICD-10-CM | POA: Diagnosis not present

## 2020-05-04 DIAGNOSIS — Z79899 Other long term (current) drug therapy: Secondary | ICD-10-CM | POA: Diagnosis not present

## 2020-05-04 HISTORY — PX: MULTIPLE EXTRACTIONS WITH ALVEOLOPLASTY: SHX5342

## 2020-05-04 LAB — BASIC METABOLIC PANEL
Anion gap: 7 (ref 5–15)
BUN: 6 mg/dL (ref 6–20)
CO2: 23 mmol/L (ref 22–32)
Calcium: 8.8 mg/dL — ABNORMAL LOW (ref 8.9–10.3)
Chloride: 106 mmol/L (ref 98–111)
Creatinine, Ser: 0.83 mg/dL (ref 0.44–1.00)
GFR, Estimated: >60 mL/min (ref >60)
Glucose, Bld: 94 mg/dL (ref 70–99)
Potassium: 3.7 mmol/L (ref 3.5–5.1)
Sodium: 136 mmol/L (ref 135–145)

## 2020-05-04 LAB — CBC
HCT: 41.6 % (ref 36.0–46.0)
Hemoglobin: 13.1 g/dL (ref 12.0–15.0)
MCH: 28.1 pg (ref 26.0–34.0)
MCHC: 31.5 g/dL (ref 30.0–36.0)
MCV: 89.3 fL (ref 80.0–100.0)
Platelets: 220 10*3/uL (ref 150–400)
RBC: 4.66 MIL/uL (ref 3.87–5.11)
RDW: 14.8 % (ref 11.5–15.5)
WBC: 5.2 10*3/uL (ref 4.0–10.5)
nRBC: 0 % (ref 0.0–0.2)

## 2020-05-04 LAB — POCT PREGNANCY, URINE: Preg Test, Ur: NEGATIVE

## 2020-05-04 SURGERY — MULTIPLE EXTRACTION WITH ALVEOLOPLASTY
Anesthesia: General | Site: Mouth | Laterality: Bilateral

## 2020-05-04 MED ORDER — SODIUM CHLORIDE 0.9 % IV SOLN
INTRAVENOUS | Status: AC | PRN
  Administered 2020-05-04: 1000 mL

## 2020-05-04 MED ORDER — FENTANYL CITRATE (PF) 100 MCG/2ML IJ SOLN
25.0000 ug | INTRAMUSCULAR | Status: DC | PRN
  Administered 2020-05-04: 25 ug via INTRAVENOUS

## 2020-05-04 MED ORDER — PHENYLEPHRINE HCL-NACL 10-0.9 MG/250ML-% IV SOLN
INTRAVENOUS | Status: DC | PRN
  Administered 2020-05-04: 25 ug/min via INTRAVENOUS

## 2020-05-04 MED ORDER — OXYCODONE HCL 5 MG/5ML PO SOLN
ORAL | Status: AC
  Filled 2020-05-04: qty 5

## 2020-05-04 MED ORDER — FENTANYL CITRATE (PF) 250 MCG/5ML IJ SOLN
INTRAMUSCULAR | Status: DC | PRN
  Administered 2020-05-04 (×2): 50 ug via INTRAVENOUS

## 2020-05-04 MED ORDER — OXYCODONE-ACETAMINOPHEN 5-325 MG PO TABS
1.0000 | ORAL_TABLET | ORAL | 0 refills | Status: DC | PRN
Start: 2020-05-04 — End: 2021-12-21

## 2020-05-04 MED ORDER — OXYCODONE HCL 5 MG/5ML PO SOLN
5.0000 mg | Freq: Once | ORAL | Status: AC | PRN
  Administered 2020-05-04: 5 mg via ORAL

## 2020-05-04 MED ORDER — OXYCODONE HCL 5 MG PO TABS: 5.0000 mg | ORAL_TABLET | Freq: Once | ORAL | Status: AC | PRN

## 2020-05-04 MED ORDER — ORAL CARE MOUTH RINSE: 15.0000 mL | Freq: Once | OROMUCOSAL | Status: AC

## 2020-05-04 MED ORDER — FENTANYL CITRATE (PF) 250 MCG/5ML IJ SOLN
INTRAMUSCULAR | Status: AC
  Filled 2020-05-04: qty 5

## 2020-05-04 MED ORDER — FENTANYL CITRATE (PF) 100 MCG/2ML IJ SOLN
INTRAMUSCULAR | Status: AC
  Administered 2020-05-04: 25 ug via INTRAVENOUS
  Filled 2020-05-04: qty 2

## 2020-05-04 MED ORDER — PROPOFOL 10 MG/ML IV BOLUS
INTRAVENOUS | Status: AC
  Filled 2020-05-04: qty 20

## 2020-05-04 MED ORDER — LIDOCAINE-EPINEPHRINE 2 %-1:100000 IJ SOLN
INTRAMUSCULAR | Status: DC | PRN
  Administered 2020-05-04: 20 mL via INTRADERMAL

## 2020-05-04 MED ORDER — 0.9 % SODIUM CHLORIDE (POUR BTL) OPTIME
TOPICAL | Status: DC | PRN
  Administered 2020-05-04: 1000 mL

## 2020-05-04 MED ORDER — LIDOCAINE 2% (20 MG/ML) 5 ML SYRINGE
INTRAMUSCULAR | Status: DC | PRN
  Administered 2020-05-04: 40 mg via INTRAVENOUS

## 2020-05-04 MED ORDER — AMOXICILLIN 500 MG PO CAPS: 500.0000 mg | ORAL_CAPSULE | Freq: Three times a day (TID) | ORAL | 0 refills | Status: DC

## 2020-05-04 MED ORDER — PROPOFOL 10 MG/ML IV BOLUS
INTRAVENOUS | Status: DC | PRN
  Administered 2020-05-04: 130 mg via INTRAVENOUS
  Administered 2020-05-04: 50 mg via INTRAVENOUS

## 2020-05-04 MED ORDER — OXYMETAZOLINE HCL 0.05 % NA SOLN
NASAL | Status: DC | PRN
  Administered 2020-05-04 (×2): 2 via NASAL

## 2020-05-04 MED ORDER — LIDOCAINE-EPINEPHRINE 2 %-1:100000 IJ SOLN
INTRAMUSCULAR | Status: AC
  Filled 2020-05-04: qty 1

## 2020-05-04 MED ORDER — MIDAZOLAM HCL 2 MG/2ML IJ SOLN
INTRAMUSCULAR | Status: AC
  Filled 2020-05-04: qty 2

## 2020-05-04 MED ORDER — DEXAMETHASONE SODIUM PHOSPHATE 10 MG/ML IJ SOLN
INTRAMUSCULAR | Status: DC | PRN
  Administered 2020-05-04: 5 mg via INTRAVENOUS

## 2020-05-04 MED ORDER — CHLORHEXIDINE GLUCONATE 0.12 % MT SOLN
15.0000 mL | Freq: Once | OROMUCOSAL | Status: AC
  Administered 2020-05-04: 15 mL via OROMUCOSAL
  Filled 2020-05-04: qty 15

## 2020-05-04 MED ORDER — ONDANSETRON HCL 4 MG/2ML IJ SOLN
INTRAMUSCULAR | Status: DC | PRN
  Administered 2020-05-04: 4 mg via INTRAVENOUS

## 2020-05-04 MED ORDER — MIDAZOLAM HCL 5 MG/5ML IJ SOLN
INTRAMUSCULAR | Status: DC | PRN
  Administered 2020-05-04: 2 mg via INTRAVENOUS

## 2020-05-04 MED ORDER — CEFAZOLIN SODIUM-DEXTROSE 2-4 GM/100ML-% IV SOLN
2.0000 g | INTRAVENOUS | Status: AC
  Administered 2020-05-04: 2 g via INTRAVENOUS
  Filled 2020-05-04: qty 100

## 2020-05-04 MED ORDER — ROCURONIUM BROMIDE 10 MG/ML (PF) SYRINGE
PREFILLED_SYRINGE | INTRAVENOUS | Status: DC | PRN
  Administered 2020-05-04: 60 mg via INTRAVENOUS

## 2020-05-04 MED ORDER — LACTATED RINGERS IV SOLN: INTRAVENOUS | Status: DC | PRN

## 2020-05-04 MED ORDER — PROMETHAZINE HCL 25 MG/ML IJ SOLN: 6.2500 mg | INTRAMUSCULAR | Status: DC | PRN

## 2020-05-04 MED ORDER — LACTATED RINGERS IV SOLN: INTRAVENOUS | Status: DC

## 2020-05-04 SURGICAL SUPPLY — 41 items
BUR CROSS CUT FISSURE 1.6 (BURR) ×2 IMPLANT
BUR CROSS CUT FISSURE 1.6MM (BURR) ×1
BUR EGG ELITE 4.0 (BURR) ×2 IMPLANT
BUR EGG ELITE 4.0MM (BURR) ×1
CANISTER SUCT 3000ML PPV (MISCELLANEOUS) ×3 IMPLANT
COVER SURGICAL LIGHT HANDLE (MISCELLANEOUS) ×3 IMPLANT
COVER WAND RF STERILE (DRAPES) ×1 IMPLANT
DECANTER SPIKE VIAL GLASS SM (MISCELLANEOUS) IMPLANT
DRAPE U-SHAPE 76X120 STRL (DRAPES) ×3 IMPLANT
GAUZE PACKING FOLDED 2  STR (GAUZE/BANDAGES/DRESSINGS) ×3
GAUZE PACKING FOLDED 2 STR (GAUZE/BANDAGES/DRESSINGS) ×1 IMPLANT
GLOVE BIO SURGEON STRL SZ 6.5 (GLOVE) IMPLANT
GLOVE BIO SURGEON STRL SZ8 (GLOVE) ×3 IMPLANT
GLOVE BIO SURGEONS STRL SZ 6.5 (GLOVE)
GLOVE BIOGEL PI IND STRL 6.5 (GLOVE) IMPLANT
GLOVE BIOGEL PI IND STRL 7.0 (GLOVE) IMPLANT
GLOVE BIOGEL PI INDICATOR 6.5 (GLOVE)
GLOVE BIOGEL PI INDICATOR 7.0 (GLOVE)
GOWN STRL REUS W/ TWL LRG LVL3 (GOWN DISPOSABLE) ×1 IMPLANT
GOWN STRL REUS W/ TWL XL LVL3 (GOWN DISPOSABLE) ×1 IMPLANT
GOWN STRL REUS W/TWL LRG LVL3 (GOWN DISPOSABLE) ×3
GOWN STRL REUS W/TWL XL LVL3 (GOWN DISPOSABLE) ×3
IV NS 1000ML (IV SOLUTION) ×3
IV NS 1000ML BAXH (IV SOLUTION) ×1 IMPLANT
KIT BASIN OR (CUSTOM PROCEDURE TRAY) ×3 IMPLANT
KIT TURNOVER KIT B (KITS) ×3 IMPLANT
NDL HYPO 25GX1X1/2 BEV (NEEDLE) ×2 IMPLANT
NDL PRECISIONGLIDE 27X1.5 (NEEDLE) IMPLANT
NEEDLE HYPO 25GX1X1/2 BEV (NEEDLE) ×6 IMPLANT
NEEDLE PRECISIONGLIDE 27X1.5 (NEEDLE) IMPLANT
NS IRRIG 1000ML POUR BTL (IV SOLUTION) ×3 IMPLANT
PAD ARMBOARD 7.5X6 YLW CONV (MISCELLANEOUS) ×3 IMPLANT
POSITIONER HEAD DONUT 9IN (MISCELLANEOUS) ×3 IMPLANT
SLEEVE IRRIGATION ELITE 7 (MISCELLANEOUS) ×3 IMPLANT
SPONGE SURGIFOAM ABS GEL 12-7 (HEMOSTASIS) IMPLANT
SUT CHROMIC 3 0 PS 2 (SUTURE) ×7 IMPLANT
SYR BULB IRRIG 60ML STRL (SYRINGE) ×2 IMPLANT
SYR CONTROL 10ML LL (SYRINGE) ×3 IMPLANT
TRAY ENT MC OR (CUSTOM PROCEDURE TRAY) ×3 IMPLANT
TUBING IRRIGATION (MISCELLANEOUS) ×3 IMPLANT
YANKAUER SUCT BULB TIP NO VENT (SUCTIONS) ×3 IMPLANT

## 2020-05-04 NOTE — Anesthesia Postprocedure Evaluation (Signed)
Anesthesia Post Note  Patient: Anne Olson  Procedure(s) Performed: MULTIPLE EXTRACTION WITH ALVEOLOPLASTY (Bilateral Mouth)     Patient location during evaluation: PACU Anesthesia Type: General Level of consciousness: awake and alert Pain management: pain level controlled Vital Signs Assessment: post-procedure vital signs reviewed and stable Respiratory status: spontaneous breathing, nonlabored ventilation and respiratory function stable Cardiovascular status: blood pressure returned to baseline and stable Postop Assessment: no apparent nausea or vomiting Anesthetic complications: no   No complications documented.  Last Vitals:  Vitals:   05/04/20 1059 05/04/20 1115  BP: (!) 147/86 (!) 153/96  Pulse: 93 85  Resp: 13 (!) 22  Temp:  (!) 36.3 C  SpO2: 97% 97%    Last Pain:  Vitals:   05/04/20 1115  TempSrc:   PainSc: [place]

## 2020-05-04 NOTE — H&P (Signed)
H&P documentation  -History and Physical Reviewed  -Patient has been re-examined  -No change in the plan of care  Anne Olson

## 2020-05-04 NOTE — Transfer of Care (Signed)
Immediate Anesthesia Transfer of Care Note  Patient: Anne Olson  Procedure(s) Performed: MULTIPLE EXTRACTION WITH ALVEOLOPLASTY (Bilateral Mouth)  Patient Location: PACU  Anesthesia Type:General  Level of Consciousness: awake, alert , patient cooperative and responds to stimulation  Airway & Oxygen Therapy: Patient Spontanous Breathing and Patient connected to face mask oxygen  Post-op Assessment: Report given to RN and Post -op Vital signs reviewed and stable  Post vital signs: Reviewed and stable  Last Vitals:  Vitals Value Taken Time  BP 159/94 05/04/20 1014  Temp    Pulse 104 05/04/20 1016  Resp 21 05/04/20 1016  SpO2 100 % 05/04/20 1016  Vitals shown include unvalidated device data.  Last Pain:  Vitals:   05/04/20 1013  TempSrc:   PainSc: (P) 0-No pain      Patients Stated Pain Goal: (P) 0 (55/01/58 6825)  Complications: No complications documented.

## 2020-05-04 NOTE — Op Note (Signed)
05/04/2020  10:00 AM  PATIENT:  Anne Olson  44 y.o. female  PRE-OPERATIVE DIAGNOSIS:  NONRESTORABLE TEETH #5, 6, 7, 8, 9, 10, 11, 12, 13, 19, 20, 23, 24, 25, 26, 28, HYPERPLASTIC LEFT MAXILLARY PALATAL TUBEROSITY POST-OPERATIVE DIAGNOSIS:  SAME  PROCEDURE:  Procedure(s): MULTIPLE EXTRACTION  TEETH #5, 6, 7, 8, 9, 10, 11, 12, 13, 19, 20, 23, 24, 25, 26, 28, ALVEOLOPLASTY, REMOVAL HYPERPLASTIC LEFT MAXILLARY PALATAL TUBEROSITY WITH ALVEOLOPLASTY  SURGEON:  Surgeon(s): Diona Browner, DDS  ANESTHESIA:   local and general  EBL:  minimal  DRAINS: none   SPECIMEN:  No Specimen  COUNTS:  YES  PLAN OF CARE: Discharge to home after PACU  PATIENT DISPOSITION:  PACU - hemodynamically stable.   PROCEDURE DETAILS: Dictation # [id number]  Gae Bon, DMD 05/04/2020 10:00 AM

## 2020-05-04 NOTE — Progress Notes (Signed)
Pt 70mg wasted in medroom with SSharyn Creamer RN

## 2020-05-04 NOTE — Anesthesia Procedure Notes (Signed)
Procedure Name: Intubation Date/Time: 05/04/2020 9:07 AM Performed by: Glynda Jaeger, CRNA Pre-anesthesia Checklist: Patient identified, Patient being monitored, Timeout performed, Emergency Drugs available and Suction available Patient Re-evaluated:Patient Re-evaluated prior to induction Oxygen Delivery Method: Circle System Utilized Preoxygenation: Pre-oxygenation with 100% oxygen Induction Type: IV induction Ventilation: Mask ventilation without difficulty Laryngoscope Size: Mac and 4 Grade View: Grade I Tube type: Oral Nasal Tubes: Left, Nasal prep performed and Nasal Rae Tube size: 7.0 mm Number of attempts: 1 Placement Confirmation: ETT inserted through vocal cords under direct vision,  positive ETCO2 and breath sounds checked- equal and bilateral Tube secured with: Tape Dental Injury: Teeth and Oropharynx as per pre-operative assessment

## 2020-05-04 NOTE — Discharge Instructions (Signed)
Dental Extraction, Care After These instructions give you information about caring for yourself after your procedure. Your doctor may also give you more specific instructions. Call your doctor if you have any problems or questions after your procedure. Follow these instructions at home: Mouth care  Follow instructions from your doctor about how to take care of the area where your tooth was taken out. Make sure you: ? Wash your hands with soap and water before you touch your mouth or your gauze pads. If you do not have soap and water, use hand sanitizer. ? Change your gauze and take it out as told by your doctor. ? Leave stitches (sutures) in place. They may need to stay in place for 2 weeks or longer, or they may dissolve on their own over several weeks.  If you have bleeding that does not stop, fold a clean piece of gauze and place it on the bleeding gum. Bite down on it gently but firmly. Do not chew on the gauze.  Do not do any of these things until your doctor says it is okay: ? Rinse your mouth. ? Brush or floss near the area where your tooth was taken out. You may brush your other teeth. ? Spit.  After your doctor says that you may rinse your mouth: ? Gargle with a salt-water mixture 3-4 times a day or as needed. To make a salt-water mixture, completely dissolve -1 tsp of salt in 1 cup of warm water. ? Rinse very gently. Do not rinse with a lot of force, because doing that can affect how your mouth heals.  If you wear fake teeth (dental prostheses), talk with your doctor about when you may start to wear them again. Eating and drinking   Do not drink through a straw until your doctor says it is okay.  Eat foods that are cool and have a soft texture, as told by your doctor. Some examples are ice cream and yogurt.  Avoid hot drinks and spicy foods until your mouth has healed. Activity  Do not drive or use heavy machinery for 24 hours if you were given a medicine to help you relax  (sedative) during your procedure.  Do not drive or use heavy machinery while taking prescription pain medicine.  Return to your normal activities as told by your doctor. Ask your doctor what activities are safe for you. Managing pain and swelling  If told, put ice on your cheek on the side of your mouth where the tooth was taken out: ? Put ice in a plastic bag. ? Place a towel between your skin and the bag. ? Leave the ice on for 20 minutes, 2-3 times a day. General instructions  Take over-the-counter and prescription medicines only as told by your doctor.  If you are taking prescription pain medicine, take actions to prevent or treat constipation. Your doctor may recommend that you: ? Drink enough fluid to keep your pee (urine) pale yellow. ? Limit foods that are high in fat and processed sugars, such as fried or sweet foods. ? Take an over-the-counter or prescription medicine for constipation.  If you were prescribed an antibiotic medicine, take it as told by your doctor. Do not stop taking the antibiotic even if you start to feel better.  Do not use any products that contain nicotine or tobacco. These include cigarettes and e-cigarettes. If you need help quitting, ask your doctor.  Keep all follow-up visits as told by your doctor. This is important. Contact a  doctor if:  You have pain that does not get better after you take your medicine.  You have any of the following: ? A fever. ? Feeling sick to your stomach (nausea). ? Throwing up (vomiting). ? Chills.  You have any of these in or near the place where your tooth used to be (socket): ? A lot of redness on your face. ? A lot of swelling in your mouth or on your face. ? A small amount of clear fluid or pus. ? New bleeding.  Your symptoms get worse.  You get new symptoms.  You lose feeling (numbness) in your lip or jaw and do not get it back.  You have tingling in your lip or jaw that does not go away. Get help  right away if:  You have very bad bleeding.  You have bleeding that does not stop after you bite down on many gauze pads.  You have very bad pain that does not get better with medicine.  You have swelling that gets worse instead of better.  You have a lot of clear fluid or pus coming from where your tooth was taken out.  You have trouble swallowing.  You cannot open your mouth.  You have shortness of breath.  You have chest pain. Summary  If you have bleeding that does not stop, fold a clean piece of gauze and place it on the bleeding gum. Bite on the gauze gently but firmly.  Do not rinse your mouth or spit until your doctor says that it is okay.  Avoid hot drinks and spicy foods until your mouth heals. This information is not intended to replace advice given to you by your health care provider. Make sure you discuss any questions you have with your health care provider. Document Revised: 05/10/2017 Document Reviewed: 03/14/2017 Elsevier Patient Education  [place].

## 2020-05-04 NOTE — Op Note (Signed)
NAMEDYANN, GOODSPEED MEDICAL RECORD NO:[id number] ACCOUNT [id number] DATE OF BIRTH:[birth year] FACILITY: [facility] LOCATION: [facility] PHYSICIAN:Katherleen Folkes M. Hermela Hardt, DDS  OPERATIVE REPORT  DATE OF PROCEDURE:  05/04/2020  PREOPERATIVE DIAGNOSES:  Nonrestorable teeth secondary to dental caries and periodontal disease, numbers 5, 6, 7, 8, 9, 10, 11, 12, 13, 19, 20, 23, 24, 25, 26, 28 and hyperplastic left maxillary palatal tuberosity.  POSTOPERATIVE DIAGNOSES:  Nonrestorable teeth secondary to dental caries and periodontal disease, numbers 5, 6, 7, 8, 9, 10, 11, 12, 13, 19, 20, 23, 24, 25, 26, 28 and hyperplastic left maxillary palatal tuberosity.  PROCEDURE:  Extraction of teeth numbers 5, 6, 7, 8, 9, 10, 11, 12, 13, 19, 23, 24, 25, 26, 28, alveoplasty right and left maxilla and mandible, removal of hyperplastic left maxillary palatal tuberosity.  SURGEON:  Diona Browner, DDS  ANESTHESIA:  General, nasal intubation.  Dr. Fransisco Beau attending.  DESCRIPTION OF PROCEDURE:  The patient was taken to the operating room and placed on the table in supine position.  General anesthesia was administered and a nasal endotracheal tube was placed and secured.  The eyes were protected and the patient was  draped for surgery.  A timeout was performed.  The posterior pharynx was suctioned and a throat pack was placed, 2% lidocaine 1:100,000 epinephrine was infiltrated in an inferior alveolar block on the right and left sides and in buccal and palatal  infiltration in the maxilla around the teeth to be removed.  A bite block was placed on the right side of the mouth and a sweetheart retractor was used to retract the tongue.  A #15 blade was used to make an incision proximal to tooth 19 along the  alveolar crest and was carried forward in the buccal gingival sulcus around tooth #20 and then on the alveolar crest until #23 was encountered and then the incision was carried forward into the buccal sulcus from 23, 24, 25, 26 to  tooth #28.  Another  incision was made in the lingual sulcus around these teeth.  The periosteum was reflected.  The teeth were elevated and removed from the mouth with the dental forceps.  The sockets were curetted.  The tissue was trimmed to allow for primary closure and  then reflected to expose the alveolar crest, which was irregular in contour owing to severe periodontal disease.  Alveoplasty was then performed to remove undercuts and level the bone.  An egg-shaped bur and the Stryker handpiece under irrigation was  used for this followed by the bone file.  Then, the area was irrigated and closed with 3-0 chromic.  The left maxilla was operated first on the top.  Then, a 15 blade was used to make a wedge incision around the fibrous tuberosity.  The tissue was then  removed with the rongeur.  The incision was carried forward to tooth #13 and then was carried forward in the buccal and palatal sulcus until tooth #6 was encountered.  The periosteum was reflected from around these teeth.  The teeth were elevated and  removed with the dental forceps.  The sockets were curetted.  The tissue was trimmed to allow for primary closure and then reflected to expose the alveolar crest, which was irregular in contour.  Alveoplasty was then performed using the egg bur followed  by the bone file and the area was irrigated and closed with 3-0 chromic.  The bite block and sweetheart retractor were repositioned to the other side of the mouth.  The right side was operated  next.  A 15 blade was used to make an incision along the  alveolar crest in the molar region, carried forward to tooth #28 and joined the original incision on the left side at tooth #26.  The periosteum was reflected.  The teeth were elevated and removed with the dental forceps.  The sockets were curetted.  The  bone was smoothed with egg bur and bone file owing to undercuts in the anterior region.  Then, the area was irrigated and closed with 3-0 chromic.   The incision was then created along the alveolar ridge at approximately tooth #3, carried forward to  teeth numbers 5 and 6.  The periosteum was reflected from around the teeth.  The teeth were elevated and removed with the dental forceps.  The sockets were then curetted and the periosteum was reflected to expose the alveolar crest.  Alveoplasty was  performed as the bone was irregular in contour and had undercuts in the area.  The egg bur and bone file were used for this and then the area was irrigated and closed with 3-0 chromic.  The oral cavity was then irrigated and suctioned.  The throat pack  was removed.  The patient was left in care of anesthesia for extubation and transport to recovery room with plans for discharge home through day surgery.  ESTIMATED BLOOD LOSS:  Minimum.  COMPLICATIONS:  None.  SPECIMENS:  None.  HN/NUANCE  D:05/04/2020 T:05/04/2020 JOB:[id number]

## 2020-05-05 ENCOUNTER — Encounter (HOSPITAL_COMMUNITY): Payer: Self-pay | Admitting: Oral Surgery

## 2020-05-31 ENCOUNTER — Telehealth: Payer: Self-pay | Admitting: Student in an Organized Health Care Education/Training Program

## 2021-02-02 NOTE — Telephone Encounter (Signed)
error 

## 2021-12-21 ENCOUNTER — Other Ambulatory Visit: Payer: Self-pay

## 2021-12-21 ENCOUNTER — Encounter (HOSPITAL_BASED_OUTPATIENT_CLINIC_OR_DEPARTMENT_OTHER): Payer: Self-pay | Admitting: Emergency Medicine

## 2021-12-21 ENCOUNTER — Emergency Department (HOSPITAL_BASED_OUTPATIENT_CLINIC_OR_DEPARTMENT_OTHER): Payer: Medicaid Other

## 2021-12-21 ENCOUNTER — Emergency Department (HOSPITAL_BASED_OUTPATIENT_CLINIC_OR_DEPARTMENT_OTHER)
Admission: EM | Admit: 2021-12-21 | Discharge: 2021-12-22 | Disposition: A | Discharge status: Home / Self Care | Payer: Medicaid Other | Attending: Emergency Medicine | Admitting: Emergency Medicine

## 2021-12-21 ENCOUNTER — Emergency Department (HOSPITAL_BASED_OUTPATIENT_CLINIC_OR_DEPARTMENT_OTHER): Payer: Medicaid Other | Admitting: Radiology

## 2021-12-21 DIAGNOSIS — Z79899 Other long term (current) drug therapy: Secondary | ICD-10-CM | POA: Insufficient documentation

## 2021-12-21 DIAGNOSIS — S0181XA Laceration without foreign body of other part of head, initial encounter: Secondary | ICD-10-CM

## 2021-12-21 DIAGNOSIS — Y9341 Activity, dancing: Secondary | ICD-10-CM | POA: Diagnosis not present

## 2021-12-21 DIAGNOSIS — S0083XA Contusion of other part of head, initial encounter: Secondary | ICD-10-CM

## 2021-12-21 DIAGNOSIS — Z23 Encounter for immunization: Secondary | ICD-10-CM | POA: Diagnosis not present

## 2021-12-21 DIAGNOSIS — S02622A Fracture of subcondylar process of left mandible, initial encounter for closed fracture: Secondary | ICD-10-CM

## 2021-12-21 DIAGNOSIS — W01198A Fall on same level from slipping, tripping and stumbling with subsequent striking against other object, initial encounter: Secondary | ICD-10-CM | POA: Insufficient documentation

## 2021-12-21 DIAGNOSIS — W010XXA Fall on same level from slipping, tripping and stumbling without subsequent striking against object, initial encounter: Secondary | ICD-10-CM

## 2021-12-21 DIAGNOSIS — S0990XA Unspecified injury of head, initial encounter: Secondary | ICD-10-CM | POA: Diagnosis present

## 2021-12-21 DIAGNOSIS — S63501A Unspecified sprain of right wrist, initial encounter: Secondary | ICD-10-CM

## 2021-12-21 MED ORDER — OXYCODONE-ACETAMINOPHEN 5-325 MG PO TABS: 1.0000 | ORAL_TABLET | Freq: Four times a day (QID) | ORAL | 0 refills | Status: DC | PRN

## 2021-12-21 MED ORDER — OXYCODONE-ACETAMINOPHEN 5-325 MG PO TABS
1.0000 | ORAL_TABLET | Freq: Once | ORAL | Status: AC
  Administered 2021-12-21: 1 via ORAL
  Filled 2021-12-21: qty 1

## 2021-12-21 MED ORDER — TETANUS-DIPHTH-ACELL PERTUSSIS 5-2.5-18.5 LF-MCG/0.5 IM SUSY
0.5000 mL | PREFILLED_SYRINGE | Freq: Once | INTRAMUSCULAR | Status: AC
  Administered 2021-12-21: 0.5 mL via INTRAMUSCULAR
  Filled 2021-12-21: qty 0.5

## 2021-12-21 MED ORDER — ALBUTEROL SULFATE (2.5 MG/3ML) 0.083% IN NEBU
5.0000 mg | INHALATION_SOLUTION | Freq: Once | RESPIRATORY_TRACT | Status: AC
  Administered 2021-12-22: 5 mg via RESPIRATORY_TRACT
  Filled 2021-12-21: qty 6

## 2021-12-21 MED ORDER — CEPHALEXIN 250 MG PO CAPS
500.0000 mg | ORAL_CAPSULE | Freq: Once | ORAL | Status: AC
  Administered 2021-12-22: 500 mg via ORAL
  Filled 2021-12-21: qty 2

## 2021-12-21 MED ORDER — ACETAMINOPHEN 500 MG PO TABS
1000.0000 mg | ORAL_TABLET | Freq: Once | ORAL | Status: AC
  Administered 2021-12-21: 1000 mg via ORAL
  Filled 2021-12-21: qty 2

## 2021-12-21 MED ORDER — CEPHALEXIN 500 MG PO CAPS: 500.0000 mg | ORAL_CAPSULE | Freq: Four times a day (QID) | ORAL | 0 refills | Status: DC

## 2021-12-21 NOTE — ED Provider Notes (Signed)
[facility] EMERGENCY DEPT Provider Note   CSN: [id number] Arrival date & time: 12/21/21  2031     History  Chief Complaint  Patient presents with   Anne Olson is a 46 y.o. female.  Pt s/p slip and fall. Was doing dance class, and socks slipped on floor, causing to fall, hitting head, lac to chin. No loc, was dazed, moderate headache post fall. Also c/o pain to left face and neck area. No radicular pain. No numbness/weakness. No nv. No back pain. No chest pain or sob. No abd pain or nv. Denies other pain or injury. Last tetanus not known. No anticoag use. Felt fine prior to slip and fall. No faintness or dizziness.   The history is provided by the patient, medical records and a significant other.  Fall Associated symptoms include headaches. Pertinent negatives include no chest pain, no abdominal pain and no shortness of breath.       Home Medications Prior to Admission medications   Medication Sig Start Date End Date Taking? Authorizing Provider  albuterol (PROVENTIL HFA;VENTOLIN HFA) 108 (90 BASE) MCG/ACT inhaler Inhale 2 puffs into the lungs every 4 (four) hours as needed for wheezing. 04/30/12   Nigel Bridgeman, CNM  amoxicillin (AMOXIL) 500 MG capsule Take 1 capsule (500 mg total) by mouth 3 (three) times daily. 05/04/20   Ocie Doyne, DMD  budesonide-formoterol (SYMBICORT) 160-4.5 MCG/ACT inhaler Take 2 puffs first thing in am and then another 2 puffs about 12 hours later. Patient taking differently: Inhale 2 puffs into the lungs in the morning and at bedtime.  05/30/12   Nyoka Cowden, MD  buPROPion (WELLBUTRIN SR) 150 MG 12 hr tablet Take 150 mg by mouth 2 (two) times daily. 03/28/20   [provider]  cyclobenzaprine (FLEXERIL) 10 MG tablet Take 1 tablet (10 mg total) by mouth at bedtime as needed for muscle spasms. Office visit needed for additional refills. 1st notice. Patient taking differently: Take 10 mg by mouth at bedtime as  needed for muscle spasms.  10/22/17   Wallis Bamberg, PA-C  etonogestrel (NEXPLANON) 68 MG IMPL implant 1 each by Subdermal route once.    [provider]  fluticasone (FLONASE) 50 MCG/ACT nasal spray Place 2 sprays into both nostrils daily.    [provider]  gabapentin (NEURONTIN) 300 MG capsule Take 300 mg by mouth 3 (three) times daily as needed (Pain).     [provider]  ibuprofen (ADVIL,MOTRIN) 800 MG tablet Take 800 mg by mouth every 8 (eight) hours as needed for mild pain or moderate pain.     [provider]  levocetirizine (XYZAL) 5 MG tablet Take 5 mg by mouth daily. 03/28/20   [provider]  lidocaine (LIDODERM) 5 % Place 1 patch onto the skin every 12 (twelve) hours as needed (Back pain). Remove & Discard patch within 12 hours or as directed by MD     [provider]  methocarbamol (ROBAXIN) 500 MG tablet Take 1 tablet (500 mg total) by mouth 2 (two) times daily. Patient taking differently: Take 500 mg by mouth 3 (three) times daily as needed for muscle spasms (Back pain).  01/16/16   Garlon Hatchet, PA-C  metoprolol tartrate (LOPRESSOR) 25 MG tablet Take 1 tablet once daily as needed for palpitations. Patient taking differently: Take 25 mg by mouth daily as needed (palpitations.).  06/07/16   Bing Neighbors, FNP  oxyCODONE-acetaminophen (PERCOCET) 5-325 MG tablet Take 1 tablet by mouth  every 4 (four) hours as needed. 05/04/20   Ocie DoyneJensen, Scott, DMD      Allergies    Bee venom and Tomato    Review of Systems   Review of Systems  Constitutional:  Negative for fever.  HENT:  Negative for nosebleeds.   Eyes:  Negative for redness.  Respiratory:  Negative for shortness of breath.   Cardiovascular:  Negative for chest pain.  Gastrointestinal:  Negative for abdominal pain and vomiting.  Genitourinary:  Negative for flank pain.  Musculoskeletal:  Positive for neck pain.  Skin:  Positive for wound.  Neurological:  Positive for  headaches. Negative for weakness and numbness.  Hematological:  Does not bruise/bleed easily.  Psychiatric/Behavioral:  Negative for confusion.     Physical Exam Updated Vital Signs BP (!) 165/87 (BP Location: Left Arm)   Pulse 98   Temp 98.3 F (36.8 C)   Resp 16   Ht 1.727 m (5\' 8" )   Wt 59 kg   SpO2 100%   BMI 19.77 kg/m  Physical Exam Vitals and nursing note reviewed.  Constitutional:      Appearance: Normal appearance. She is well-developed.  HENT:     Head:     Comments: Tenderness left face and scalp. 1 cm lac to chin. Tenderness near left angle/neck of mandible. No gross malocclusion noted.     Right Ear: Tympanic membrane normal.     Left Ear: Tympanic membrane normal.     Nose: Nose normal.     Mouth/Throat:     Mouth: Mucous membranes are moist.  Eyes:     General: No scleral icterus.    Conjunctiva/sclera: Conjunctivae normal.     Pupils: Pupils are equal, round, and reactive to light.  Neck:     Trachea: No tracheal deviation.  Cardiovascular:     Rate and Rhythm: Normal rate and regular rhythm.     Pulses: Normal pulses.     Heart sounds: Normal heart sounds. No murmur heard.    No friction rub. No gallop.  Pulmonary:     Effort: Pulmonary effort is normal. No respiratory distress.     Breath sounds: Normal breath sounds.  Chest:     Chest wall: No tenderness.  Abdominal:     General: There is no distension.     Tenderness: There is no abdominal tenderness.  Genitourinary:    Comments: No cva tenderness.  Musculoskeletal:        General: No swelling.     Cervical back: Normal range of motion and neck supple. No rigidity. No muscular tenderness.     Comments: Mid cervical tenderness, otherwise, CTLS spine, non tender, aligned, no step off. Tenderness right wrist. No focal scaphoid tenderness. Radial pulse 2+, skin intact. Good rom bil ext, no other focal pain or bony tenderness noted.   Skin:    General: Skin is warm and dry.     Findings: No  rash.  Neurological:     Mental Status: She is alert.     Comments: Alert, speech normal. GCS 15. Motor/sens grossly intact bil.   Psychiatric:        Mood and Affect: Mood normal.     ED Results / Procedures / Treatments   Labs (all labs ordered are listed, but only abnormal results are displayed) Labs Reviewed - No data to display  EKG None  Radiology CT Cervical Spine Wo Contrast  Result Date: 12/21/2021 CLINICAL DATA:  Head trauma EXAM: CT HEAD WITHOUT CONTRAST CT MAXILLOFACIAL  WITHOUT CONTRAST CT CERVICAL SPINE WITHOUT CONTRAST TECHNIQUE: Multidetector CT imaging of the head, cervical spine, and maxillofacial structures were performed using the standard protocol without intravenous contrast. Multiplanar CT image reconstructions of the cervical spine and maxillofacial structures were also generated. RADIATION DOSE REDUCTION: This exam was performed according to the departmental dose-optimization program which includes automated exposure control, adjustment of the mA and/or kV according to patient size and/or use of iterative reconstruction technique. COMPARISON:  None Available. FINDINGS: CT HEAD FINDINGS Brain: No evidence of acute infarction, hemorrhage, hydrocephalus, extra-axial collection or mass lesion/mass effect. Slight prominence of the anterior extra-axial CSF spaces. Vascular: No hyperdense vessels.  No unexpected calcification Skull: Normal. Negative for fracture or focal lesion. Other: None CT MAXILLOFACIAL FINDINGS Osseous: Mastoid air cells are clear. Acute mildly comminuted and displaced fracture involving the left mandibular neck. No definite contralateral right mandibular fracture. Pterygoid plates and zygomatic arches are intact. No acute nasal bone fracture. Orbits: Negative. No traumatic or inflammatory finding. Sinuses: Clear. Soft tissues: Chin laceration or wound CT CERVICAL SPINE FINDINGS Alignment: Straightening of the cervical spine. No subluxation. Facet alignment  within normal limits Skull base and vertebrae: No acute fracture. No primary bone lesion or focal pathologic process. Soft tissues and spinal canal: No prevertebral fluid or swelling. No visible canal hematoma. Disc levels: Moderate disc space narrowing and degenerative change C5-C6 and C6-C7 Upper chest: Apical emphysema Other: None IMPRESSION: 1. No CT evidence for acute intracranial abnormality. Slight prominence of the anterior extra-axial CSF spaces could be due to mild age advanced atrophy. 2. Straightening of the cervical spine with degenerative change. No acute fracture 3. Acute mildly comminuted and displaced left mandibular neck fracture 4. Emphysema Electronically Signed   By: Jasmine Pang M.D.   On: 12/21/2021 22:40   CT Head Wo Contrast  Result Date: 12/21/2021 CLINICAL DATA:  Head trauma EXAM: CT HEAD WITHOUT CONTRAST CT MAXILLOFACIAL WITHOUT CONTRAST CT CERVICAL SPINE WITHOUT CONTRAST TECHNIQUE: Multidetector CT imaging of the head, cervical spine, and maxillofacial structures were performed using the standard protocol without intravenous contrast. Multiplanar CT image reconstructions of the cervical spine and maxillofacial structures were also generated. RADIATION DOSE REDUCTION: This exam was performed according to the departmental dose-optimization program which includes automated exposure control, adjustment of the mA and/or kV according to patient size and/or use of iterative reconstruction technique. COMPARISON:  None Available. FINDINGS: CT HEAD FINDINGS Brain: No evidence of acute infarction, hemorrhage, hydrocephalus, extra-axial collection or mass lesion/mass effect. Slight prominence of the anterior extra-axial CSF spaces. Vascular: No hyperdense vessels.  No unexpected calcification Skull: Normal. Negative for fracture or focal lesion. Other: None CT MAXILLOFACIAL FINDINGS Osseous: Mastoid air cells are clear. Acute mildly comminuted and displaced fracture involving the left  mandibular neck. No definite contralateral right mandibular fracture. Pterygoid plates and zygomatic arches are intact. No acute nasal bone fracture. Orbits: Negative. No traumatic or inflammatory finding. Sinuses: Clear. Soft tissues: Chin laceration or wound CT CERVICAL SPINE FINDINGS Alignment: Straightening of the cervical spine. No subluxation. Facet alignment within normal limits Skull base and vertebrae: No acute fracture. No primary bone lesion or focal pathologic process. Soft tissues and spinal canal: No prevertebral fluid or swelling. No visible canal hematoma. Disc levels: Moderate disc space narrowing and degenerative change C5-C6 and C6-C7 Upper chest: Apical emphysema Other: None IMPRESSION: 1. No CT evidence for acute intracranial abnormality. Slight prominence of the anterior extra-axial CSF spaces could be due to mild age advanced atrophy. 2. Straightening of the cervical spine  with degenerative change. No acute fracture 3. Acute mildly comminuted and displaced left mandibular neck fracture 4. Emphysema Electronically Signed   By: Jasmine Pang M.D.   On: 12/21/2021 22:40   CT Maxillofacial WO CM  Result Date: 12/21/2021 CLINICAL DATA:  Head trauma EXAM: CT HEAD WITHOUT CONTRAST CT MAXILLOFACIAL WITHOUT CONTRAST CT CERVICAL SPINE WITHOUT CONTRAST TECHNIQUE: Multidetector CT imaging of the head, cervical spine, and maxillofacial structures were performed using the standard protocol without intravenous contrast. Multiplanar CT image reconstructions of the cervical spine and maxillofacial structures were also generated. RADIATION DOSE REDUCTION: This exam was performed according to the departmental dose-optimization program which includes automated exposure control, adjustment of the mA and/or kV according to patient size and/or use of iterative reconstruction technique. COMPARISON:  None Available. FINDINGS: CT HEAD FINDINGS Brain: No evidence of acute infarction, hemorrhage, hydrocephalus,  extra-axial collection or mass lesion/mass effect. Slight prominence of the anterior extra-axial CSF spaces. Vascular: No hyperdense vessels.  No unexpected calcification Skull: Normal. Negative for fracture or focal lesion. Other: None CT MAXILLOFACIAL FINDINGS Osseous: Mastoid air cells are clear. Acute mildly comminuted and displaced fracture involving the left mandibular neck. No definite contralateral right mandibular fracture. Pterygoid plates and zygomatic arches are intact. No acute nasal bone fracture. Orbits: Negative. No traumatic or inflammatory finding. Sinuses: Clear. Soft tissues: Chin laceration or wound CT CERVICAL SPINE FINDINGS Alignment: Straightening of the cervical spine. No subluxation. Facet alignment within normal limits Skull base and vertebrae: No acute fracture. No primary bone lesion or focal pathologic process. Soft tissues and spinal canal: No prevertebral fluid or swelling. No visible canal hematoma. Disc levels: Moderate disc space narrowing and degenerative change C5-C6 and C6-C7 Upper chest: Apical emphysema Other: None IMPRESSION: 1. No CT evidence for acute intracranial abnormality. Slight prominence of the anterior extra-axial CSF spaces could be due to mild age advanced atrophy. 2. Straightening of the cervical spine with degenerative change. No acute fracture 3. Acute mildly comminuted and displaced left mandibular neck fracture 4. Emphysema Electronically Signed   By: Jasmine Pang M.D.   On: 12/21/2021 22:40   DG Wrist Complete Right  Result Date: 12/21/2021 CLINICAL DATA:  Fall.  Pain.  Fell on right wrist. EXAM: RIGHT WRIST - COMPLETE 3+ VIEW COMPARISON:  None available FINDINGS: Neutral ulnar variance. Joint spaces are preserved. Mild chronic cystic lucencies within the mid to distal capitate. No acute fracture is seen. No dislocation. IMPRESSION: No acute fracture. Electronically Signed   By: Neita Garnet M.D.   On: 12/21/2021 21:34    Procedures .Marland KitchenLaceration  Repair  Date/Time: 12/21/2021 11:32 PM  Performed by: Cathren Laine, MD Authorized by: Cathren Laine, MD   Consent:    Consent given by:  Patient Laceration details:    Location:  Face   Face location:  Chin   Length (cm):  1.5 Pre-procedure details:    Preparation:  Patient was prepped and draped in usual sterile fashion and imaging obtained to evaluate for foreign bodies Exploration:    Imaging outcome: foreign body not noted     Wound exploration: entire depth of wound visualized     Contaminated: no   Treatment:    Area cleansed with:  Saline   Amount of cleaning:  Standard   Irrigation solution:  Sterile saline Skin repair:    Repair method:  Sutures   Suture size:  5-0   Suture material:  Prolene   Suture technique:  Simple interrupted   Number of sutures:  3 Repair  type:    Repair type:  Simple Post-procedure details:    Dressing:  Sterile dressing   Procedure completion:  Tolerated well, no immediate complications     Medications Ordered in ED Medications - No data to display  ED Course/ Medical Decision Making/ A&P                           Medical Decision Making Problems Addressed: Chin laceration, initial encounter: acute illness or injury Closed subcondylar fracture of left side of mandible, initial encounter [facility]): acute illness or injury Contusion of chin, initial encounter: acute illness or injury Fall from slip, trip, or stumble, initial encounter: acute illness or injury with systemic symptoms that poses a threat to life or bodily functions Sprain of right wrist, initial encounter: acute illness or injury  Amount and/or Complexity of Data Reviewed Independent Historian:     Details: signif other, hx External Data Reviewed: notes. Radiology: ordered and independent interpretation performed. Decision-making details documented in ED Course. Discussion of management or test interpretation with external provider(s): ENT.   Risk OTC  drugs. Prescription drug management. Decision regarding hospitalization.   Iv ns. Continuous pulse ox and cardiac monitoring. Labs ordered/sent. Imaging ordered.   Diff dx includes facial fractures, head injury, wrist fx, etc - dispo decision including potential need for admission considered - will get imaging and reassess.   Reviewed nursing notes and prior charts for additional history. External reports reviewed. Additional history from: family member.   Cardiac monitor: sinus rhythm, rate 88.  Xrays reviewed/interpreted by me - no fx.   CT reviewed/interpreted by me - no hem. +left mandible neck fx.   Wound sutured. Sterile dressing.   Splint right wrist. Icepack to sore areas.   Percocet po.   Maxillofacial/mandible trauma consulted.   Maxillofacial/ENT re-consulted - await return call.   Keflex po.   Pain controlled.   2359, awaiting call back from ENT/MF trauma - signed out to Dr Preston Fleeting to f/u with consultant for plan.             Final Clinical Impression(s) / ED Diagnoses Final diagnoses:  None    Rx / DC Orders ED Discharge Orders     None         Cathren Laine, MD 12/21/21 2359

## 2021-12-21 NOTE — Discharge Instructions (Addendum)
It was our pleasure to provide your ER care today - we hope that you feel better.  Your imaging studies show a left sided mandible fracture - follow up closely with maxillofacial trauma doctor/ENT doctor in the next 1-2 days - call office this morning to arrange appointment - when you call, tell them that you were in the ER tonight, diagnosed with mandible fracture, and that we need them to follow up with you tomorrow.   Follow soft diet until cleared seen by ENT specialist (no solid or hard foods).   Your wrist xrays were read as showing no definite fracture - exam is most c/w wrist sprain, although as discussed, occult fracture (fracture not visible on initial xrays) is a possibility - wear splint, ice/coldpack to area, and follow up with primary care doctor in 1-2 weeks if symptoms fail to improve/resolve.   Take antibiotic (keflex) as prescribed. Take motrin or aleve as need for pain. You may also take percocet as need for pain. No driving for the next 6 hours or when taking percocet. Also, do not take tylenol or acetaminophen containing medication when taking percocet.   Keep sutured wound very clean. Have sutures removed in one week.   Return to ER if worse, new symptoms, fevers, new, worsening or severe pain, infection of wound, or other concern.

## 2021-12-21 NOTE — ED Triage Notes (Addendum)
Slip fall on right wrist and left side of face. Lac on chin.  Reports that everything went black and she was down for a few seconds. Able to move fingers and extend elbow

## 2021-12-22 NOTE — ED Notes (Signed)
RT educated the pt on smoking cessation and the importance of quitting to avoid asthma exacerbations in the future. Pt also advised of the different ways she has available to quit smoking as well. Pt verbalizes understanding of information and teaching at this time.

## 2021-12-22 NOTE — ED Provider Notes (Signed)
No callback from consultant.  Patient will be discharged with instructions to call the office in the morning for follow-up appointment.   Dione Booze, MD 12/22/21 0040

## 2021-12-28 ENCOUNTER — Ambulatory Visit (INDEPENDENT_AMBULATORY_CARE_PROVIDER_SITE_OTHER): Payer: Medicaid Other | Admitting: Otolaryngology

## 2022-11-02 ENCOUNTER — Encounter: Payer: Self-pay | Admitting: Podiatry

## 2022-11-02 ENCOUNTER — Ambulatory Visit (INDEPENDENT_AMBULATORY_CARE_PROVIDER_SITE_OTHER): Payer: Medicaid Other | Admitting: Podiatry

## 2022-11-02 ENCOUNTER — Ambulatory Visit (INDEPENDENT_AMBULATORY_CARE_PROVIDER_SITE_OTHER): Payer: Medicaid Other

## 2022-11-02 DIAGNOSIS — M722 Plantar fascial fibromatosis: Secondary | ICD-10-CM

## 2022-11-02 MED ORDER — TRIAMCINOLONE ACETONIDE 10 MG/ML IJ SUSP
20.0000 mg | Freq: Once | INTRAMUSCULAR | Status: AC
  Administered 2022-11-02: 20 mg

## 2022-11-02 NOTE — Patient Instructions (Signed)

## 2022-11-06 NOTE — Progress Notes (Signed)
Subjective:   Patient ID: Anne Olson, female   DOB: 47 y.o.   MRN: [id number]   HPI Patient states she has developed pain in the bottom of both heels for several months and its gotten worse recently.  She is tried massage and soaking and shoe gear changes.  Patient smokes quarter pack per day tries to be active   Review of Systems  All other systems reviewed and are negative.       Objective:  Physical Exam Vitals and nursing note reviewed.  Constitutional:      Appearance: She is well-developed.  Pulmonary:     Effort: Pulmonary effort is normal.  Musculoskeletal:        General: Normal range of motion.  Skin:    General: Skin is warm.  Neurological:     Mental Status: She is alert.     Neurovascular status intact muscle strength adequate range of motion adequate with patient found to have exquisite discomfort in the plantar heel region bilateral inflammation fluid around the medial band with pain.  Good digital perfusion well-oriented     Assessment:  Acute Planter fasciitis bilateral     Plan:  H&P reviewed sterile prep injected the plantar fascia as bilateral 3 mg Kenalog 5 mg Xylocaine educated her on condition shoe gear modifications and physical therapy and we will see back  X-rays indicate small spur no indication stress fracture arthritis

## 2022-12-20 ENCOUNTER — Encounter: Payer: Self-pay | Admitting: Podiatry

## 2022-12-20 ENCOUNTER — Ambulatory Visit: Payer: Medicaid Other | Admitting: Podiatry

## 2022-12-20 DIAGNOSIS — M722 Plantar fascial fibromatosis: Secondary | ICD-10-CM

## 2022-12-20 DIAGNOSIS — M21622 Bunionette of left foot: Secondary | ICD-10-CM | POA: Diagnosis not present

## 2022-12-20 NOTE — Progress Notes (Signed)
Subjective:   Patient ID: Anne Olson, female   DOB: 47 y.o.   MRN: [id number]   HPI Patient presents stating her heels are still really hurting her she has lesions on both feet and she just has overall tender type tissue with patient walking long hours   ROS      Objective:  Physical Exam  Neurovascular status found to be intact significant depression of the arch noted bilateral with keratotic lesion formation subleft heel and fifth metatarsal bilateral with diminished fat pad     Assessment:  Several different problems with fascial inflammation noted lesion formation and poor foot structure H&P reviewed     Plan:  Reviewed I debrided lesions I discussed Planter fasciitis we discussed shoe gear modifications insert therapy and I advised on stretching exercises physical therapy.  Patient's discharge will be seen back as needed may require further injections or ultimately other procedures in the future which I advised her today

## 2024-03-03 ENCOUNTER — Ambulatory Visit: Payer: Self-pay

## 2024-03-03 ENCOUNTER — Encounter: Payer: Self-pay | Admitting: Gastroenterology

## 2024-03-03 NOTE — Telephone Encounter (Signed)
 FYI Only or Action Required?: FYI only for provider.  Patient called to make a new patient appointment with pulmonary. Patient instructed to follow up with PCP if symptoms worsen before her pulmonary appointment. Patient is transferred to PAS for scheduling.   Called Nurse Triage reporting Shortness of Breath.  Symptoms began about a month ago.  Interventions attempted: Rescue inhaler, Maintenance inhaler, and Nebulizer treatments.  Symptoms are: stable.  Triage Disposition: See PCP Within 2 Weeks  Patient/caregiver understands and will follow disposition?: No, wishes to speak with PCP  Copied from CRM #[id number]. Topic: Clinical - Red Word Triage >> Mar 03, 2024 10:26 AM Rilla NOVAK wrote: [facility] that prompted transfer to Nurse Triage: Increased shortness of breath   ----------------------------------------------------------------------- From previous Reason for Contact - Scheduling: Patient/patient representative is calling to schedule an appointment. Refer to attachments for appointment information.    ----------------------------------------------------------------------- From previous Reason for Contact - Other: Reason for CRM: Patient stating a referral to pulm was sent on yesterday. Patient had Reason for Disposition  [1] MILD longstanding difficulty breathing (e.g., minimal/no SOB at rest, SOB with walking, pulse < 100) AND [2] SAME as normal  Answer Assessment - Initial Assessment Questions Patient reports she does use her inhaler and nebulizer. Patient was referred to pulmonary due to asthma. Patient is given instructions to follow up with PCP if any issues arise before she is able to see pulmonary. Patient is transferred over to PAS for scheduling new patient appointment.   1. RESPIRATORY STATUS: Describe your breathing? (e.g., wheezing, shortness of breath, unable to speak, severe coughing)      Shortness of breath, wheezing at times 2. ONSET: When did this  breathing problem begin?      Increase in shortness of breath over the last month.  3. PATTERN Does the difficult breathing come and go, or has it been constant since it started?      Comes and goes-more shortness of breath with any endurance. 4. SEVERITY: How bad is your breathing? (e.g., mild, moderate, severe)      mild 5. RECURRENT SYMPTOM: Have you had difficulty breathing before? If Yes, ask: When was the last time? and What happened that time?      yes 6. CARDIAC HISTORY: Do you have any history of heart disease? (e.g., heart attack, angina, bypass surgery, angioplasty)      Heart murmur 7. LUNG HISTORY: Do you have any history of lung disease?  (e.g., pulmonary embolus, asthma, emphysema)     asthma 8. CAUSE: What do you think is causing the breathing problem?      Unsure of what is causing the increase in shortness of breath 9. OTHER SYMPTOMS: Do you have any other symptoms? (e.g., chest pain, cough, dizziness, fever, runny nose)     Runny nose, cough due to allergies.  10. O2 SATURATION MONITOR:  Do you use an oxygen saturation monitor (pulse oximeter) at home? If Yes, ask: What is your reading (oxygen level) today? What is your usual oxygen saturation reading? (e.g., 95%)       Oxygen levels are checked at home-98-100% 11. PREGNANCY: Is there any chance you are pregnant? When was your last menstrual period?       no 12. TRAVEL: Have you traveled out of the country in the last month? (e.g., travel history, exposures)       no  Protocols used: Breathing Difficulty-A-AH

## 2024-03-06 ENCOUNTER — Ambulatory Visit (INDEPENDENT_AMBULATORY_CARE_PROVIDER_SITE_OTHER)

## 2024-03-06 VITALS — BP 120/84 | HR 87 | Ht 68.5 in | Wt 117.0 lb

## 2024-03-06 DIAGNOSIS — J4540 Moderate persistent asthma, uncomplicated: Secondary | ICD-10-CM

## 2024-03-06 DIAGNOSIS — F17210 Nicotine dependence, cigarettes, uncomplicated: Secondary | ICD-10-CM | POA: Diagnosis not present

## 2024-03-06 DIAGNOSIS — Z716 Tobacco abuse counseling: Secondary | ICD-10-CM

## 2024-03-06 LAB — CBC WITH DIFFERENTIAL/PLATELET
Basophils Absolute: 0 K/uL (ref 0.0–0.1)
Basophils Relative: 0.4 % (ref 0.0–3.0)
Eosinophils Absolute: 0 K/uL (ref 0.0–0.7)
Eosinophils Relative: 0.3 % (ref 0.0–5.0)
HCT: 41.1 % (ref 36.0–46.0)
Hemoglobin: 13.5 g/dL (ref 12.0–15.0)
Lymphocytes Relative: 33.8 % (ref 12.0–46.0)
Lymphs Abs: 1.7 K/uL (ref 0.7–4.0)
MCHC: 32.8 g/dL (ref 30.0–36.0)
MCV: 85.5 fl (ref 78.0–100.0)
Monocytes Absolute: 0.5 K/uL (ref 0.1–1.0)
Monocytes Relative: 9.6 % (ref 3.0–12.0)
Neutro Abs: 2.8 K/uL (ref 1.4–7.7)
Neutrophils Relative %: 55.9 % (ref 43.0–77.0)
Platelets: 221 K/uL (ref 150.0–400.0)
RBC: 4.81 Mil/uL (ref 3.87–5.11)
RDW: 15.1 % (ref 11.5–15.5)
WBC: 5 K/uL (ref 4.0–10.5)

## 2024-03-06 LAB — POCT EXHALED NITRIC OXIDE: FeNO level (ppb): <5

## 2024-03-06 MED ORDER — AIRSUPRA 90-80 MCG/ACT IN AERO: 2.0000 | INHALATION_SPRAY | Freq: Four times a day (QID) | RESPIRATORY_TRACT | 4 refills | Status: DC | PRN

## 2024-03-06 MED ORDER — BUDESONIDE-FORMOTEROL FUMARATE 160-4.5 MCG/ACT IN AERO: INHALATION_SPRAY | RESPIRATORY_TRACT | 12 refills | Status: AC

## 2024-03-06 MED ORDER — BUDESONIDE-FORMOTEROL FUMARATE 160-4.5 MCG/ACT IN AERO: INHALATION_SPRAY | RESPIRATORY_TRACT | 12 refills | Status: DC

## 2024-03-06 MED ORDER — PREDNISONE 20 MG PO TABS: 40.0000 mg | ORAL_TABLET | Freq: Every day | ORAL | 0 refills | Status: DC

## 2024-03-06 MED ORDER — PREDNISONE 20 MG PO TABS: 30.0000 mg | ORAL_TABLET | Freq: Every day | ORAL | 0 refills | Status: AC

## 2024-03-06 NOTE — Patient Instructions (Signed)
 Notification of test results are managed in the following manner: If there are any recommendations or changes to the plan of care discussed in office today, we will contact you and let you know what they are. If you do not hear from us , then your results are normal/expected and you can view them through your MyChart account, or a letter will be sent to you. Thank you again for trusting us  with your care [facility].

## 2024-03-06 NOTE — Progress Notes (Signed)
 New Patient Pulmonology Office Visit   Subjective:  Patient ID: Anne Olson, female    DOB: 07-Aug-1975  MRN: [id number]  Referred by: Wilkie Majel GORMAN DEWAINE  CC:  Chief Complaint  Patient presents with   Consult    Asthma consult for worsening SOB at rest and exertion over the last two months and more frequent asthma attacks (3 over the last 2 months)     HPI Anne Olson is a 48 y.o. female with asthma, prior pneumonias, anxiety/depression presents for evaluation of asthma. Has seasonal allergies which is perenial. On levocetrizine and flonase.  Last PNA last year, likely was asthma flare but was Rx with abx.   Since June 2025 she was treated for asthma exacerbation with prednisone . Her SOB has worsened since then. Intermittent cough w some phlegm, clear but almost daily. Seasonal allergies are currently controlled. Intermittent wheezing.   ASTHMA:  First diagnosed: teens.  FH: sister Triggers: chlorox wipes, vicks vapor. No trigger to season change.  Intubated: none.  Best PEFR: last done many years ago. Does not remember.  Last steroid use: multiple times in the past. Last use June 2025. 2-3 times/year use.  Times albuterol  used: symbicort  80 ordered by PCP.  Treatment: ventolin  only. Using it 2-3 times/day or more. Recently prescribed symbicort  but has not used it.  Others: pregnancy- worsening w 1st pregnancy, allergy symptoms- yes, throat choking/stridor- no, GERD- no but on prilosec, OSA- no  Lung Health: Functional status: <1 block.  Covid vaccine: UTD w COVId. Influenza vaccine: will get it through PCP.  Pneumonococcal vaccine: through PCP.  Smoking: 4-5 cig/day currently , prior 1/2 pk for at least 25 years.  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   Testing: Labs: No eosinophilia.  CT chest 2016 reviewed by me: Motion artifact.  Bibasilar ground glass opacities likely atelectasis likely underlying emphysematous changes.  No  PE.  No prior PFTs. Asthma Control Test ACT Total Score  03/06/2024  9:35 AM 10    Allergies: Bee venom and Tomato  History: Past Medical History:  Diagnosis Date   Anemia    iron supplements in the past   Anxiety    has been prescribed Xanax   Asthma    has albuterol , advair prn, no attack x 2 years;triggered by strong smells (bleach and smoke)   Benign breast cyst in female 1995   had bx done was normal   DUB (dysfunctional uterine bleeding) 1995   Family hx cleft lip 04/29/2012   FOB's son    Gestational Hypertension 07/17/2012   Headache(784.0)    during and prior to pregnancy;usually Rt side of head;can effect vision   Heart murmur    Hx of preeclampsia, prior pregnancy, currently pregnant 03/12/2012   Induced at 38wks, no meds     Infection    Yeast inf;gets freq w/ antibxs or scented soaps   Infection    BV;recently treated for BV completed Flagyl    Memory loss 2013   Currently having difficult time remembering things   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   Ovarian cyst in pregnancy 2013   seen on recent US    Preeclampsia 1994   induced @ 38 weeks   Scoliosis    Past Surgical History:  Procedure Laterality Date   DILATION AND EVACUATION N/A 09/29/2012   Procedure: DILATATION AND EVACUATION;  Surgeon: Winton Felt, MD;  Location: [facility];  Service: Gynecology;  Laterality: N/A;   MULTIPLE  EXTRACTIONS WITH ALVEOLOPLASTY Bilateral 05/04/2020   Procedure: MULTIPLE EXTRACTION WITH ALVEOLOPLASTY;  Surgeon: Sheryle Hamilton, DDS;  Location: [facility];  Service: Oral Surgery;  Laterality: Bilateral;   WISDOM TOOTH EXTRACTION  05/2011   all 4 removed   Family History  Problem Relation Age of Onset   Cancer Mother        Throat   Seizures Mother    Ulcers Mother    Pancreatitis Mother    Cancer Father        Throat   Hypertension Father    Thyroid  disease Father        Hyperthyoid   Pancreatitis Father    COPD Father    Cirrhosis Father         Liver   Asthma Sister    Asthma Brother    Hypertension Paternal Grandmother    Heart failure Paternal Grandmother    Hypertension Paternal Aunt    Diabetes Maternal Aunt    Asthma Cousin    Asthma Paternal Aunt    Stroke Maternal Uncle    Stroke Paternal Uncle        x 2   Sickle cell trait Other        Nephew   Social History   Socioeconomic History   Marital status: Significant Other    Spouse name: Not on file   Number of children: 1   Years of education: 18   Highest education level: Not on file  Occupational History   Occupation: Resident Care Assistant/Med tech  Tobacco Use   Smoking status: Every Day    Current packs/day: 0.25    Average packs/day: 0.3 packs/day for 10.0 years (2.5 ttl pk-yrs)    Types: Cigarettes   Smokeless tobacco: Never   Tobacco comments:    Has smoked for 20 years, about 1/4 ppd  Substance and Sexual Activity   Alcohol use: Yes    Comment: Occasionally   Drug use: No   Sexual activity: Never    Partners: Male    Birth control/protection: Implant, None    Comment: Has Norplant in Lt arm that is still in place - placed in 1994  Other Topics Concern   Not on file  Social History Narrative   Not on file   Social Drivers of Health   Financial Resource Strain: Not on file  Food Insecurity: Not on file  Transportation Needs: Not on file  Physical Activity: Not on file  Stress: Not on file  Social Connections: Unknown (08/06/2022)   Received from [facility]   Social Network    Social Network: Not on file  Intimate Partner Violence: Unknown (08/06/2022)   Received from [facility]   HITS    Physically Hurt: Not on file    Insult or Talk Down To: Not on file    Threaten Physical Harm: Not on file    Scream or Curse: Not on file    Immunization status: Immunization History  Administered Date(s) Administered   Influenza Split 04/30/2012   Influenza,inj,Quad PF,6+ Mos 05/21/2015   Pfizer(Comirnaty)Fall Seasonal Vaccine 12  years and older 04/26/2023   Pneumococcal Polysaccharide-23 08/14/2012   Tdap 07/31/2012, 12/21/2021    Current Meds:  Current Outpatient Medications:    Albuterol -Budesonide  (AIRSUPRA ) 90-80 MCG/ACT AERO, Inhale 2 puffs into the lungs every 6 (six) hours as needed., Disp: 21.4 g, Rfl: 4   buPROPion (WELLBUTRIN SR) 150 MG 12 hr tablet, Take 150 mg by mouth 2 (two) times daily., Disp: , Rfl:  etonogestrel  (NEXPLANON ) 68 MG IMPL implant, 1 each by Subdermal route once., Disp: , Rfl:    fluticasone (FLONASE) 50 MCG/ACT nasal spray, Place 2 sprays into both nostrils daily., Disp: , Rfl:    gabapentin (NEURONTIN) 300 MG capsule, Take 300 mg by mouth 3 (three) times daily as needed (Pain). , Disp: , Rfl:    ibuprofen  (ADVIL ,MOTRIN ) 800 MG tablet, Take 800 mg by mouth every 8 (eight) hours as needed for mild pain or moderate pain. , Disp: , Rfl:    levocetirizine (XYZAL) 5 MG tablet, Take 5 mg by mouth daily., Disp: , Rfl:    lidocaine  (LIDODERM ) 5 %, Place 1 patch onto the skin every 12 (twelve) hours as needed (Back pain). Remove & Discard patch within 12 hours or as directed by MD , Disp: , Rfl:    metoprolol  tartrate (LOPRESSOR ) 25 MG tablet, Take 1 tablet once daily as needed for palpitations. (Patient taking differently: Take 25 mg by mouth daily as needed (palpitations.).), Disp: 180 tablet, Rfl: 3   omeprazole (PRILOSEC) 20 MG capsule, Take 20 mg by mouth daily., Disp: , Rfl:    Vitamin D, Ergocalciferol, (DRISDOL) 1.25 MG (50000 UNIT) CAPS capsule, Take 50,000 Units by mouth 2 (two) times a week. (Patient taking differently: Take 50,000 Units by mouth once a week.), Disp: , Rfl:    budesonide -formoterol  (SYMBICORT ) 160-4.5 MCG/ACT inhaler, Take 2 puffs first thing in am and then another 2 puffs about 12 hours later., Disp: 10.2 each, Rfl: 12   cyclobenzaprine  (FLEXERIL ) 10 MG tablet, Take 1 tablet (10 mg total) by mouth at bedtime as needed for muscle spasms. Office visit needed for  additional refills. 1st notice. (Patient not taking: Reported on 03/06/2024), Disp: 15 tablet, Rfl: 0   hydrochlorothiazide (HYDRODIURIL) 12.5 MG tablet, Take 12.5 mg by mouth daily., Disp: , Rfl:    methocarbamol  (ROBAXIN ) 500 MG tablet, Take 1 tablet (500 mg total) by mouth 2 (two) times daily. (Patient not taking: Reported on 03/06/2024), Disp: 20 tablet, Rfl: 0   oxyCODONE -acetaminophen  (PERCOCET/ROXICET) 5-325 MG tablet, Take 1-2 tablets by mouth every 6 (six) hours as needed for severe pain. (Patient not taking: Reported on 03/06/2024), Disp: 20 tablet, Rfl: 0   predniSONE  (DELTASONE ) 20 MG tablet, Take 1.5 tablets (30 mg total) by mouth daily with breakfast., Disp: 15 tablet, Rfl: 0      Objective:  BP 120/84   Pulse 87   Ht 5' 8.5 (1.74 m)   Wt 117 lb (53.1 kg)   SpO2 100% Comment: RA  BMI 17.53 kg/m  BMI Readings from Last 3 Encounters:  03/06/24 17.53 kg/m  12/21/21 19.77 kg/m  05/04/20 19.01 kg/m    General: not in distress patient.  Lungs: poor air entry b/l  Heart: regular rate rhythm, no murmur appreciated.  Abdomen: non tender, non distended. Normal BS.  Neuro: axox3.  Moving all extremiteies.    Diagnostic Review:  Last CBC Lab Results  Component Value Date   WBC 5.2 05/04/2020   HGB 13.1 05/04/2020   HCT 41.6 05/04/2020   MCV 89.3 05/04/2020   MCH 28.1 05/04/2020   RDW 14.8 05/04/2020   PLT 220 05/04/2020       Assessment & Plan:   Assessment & Plan Moderate persistent allergic asthma Has current asthma.  Dc symbicort  80 and start 160 2 puff BID.  Asthma action plan discussion. Prednisone  30mg   x 10 days provided to be taken only in case of emergency and not currently.  PEFR  to be done at home and kept tabs on.  Feno <5.  This result suggests low (<25) Type 2 (T2) airway inflammation indicating a low likelihood of active T2-driven airway inflammation; reduced probability of response to inhaled corticosteroids.    Orders:   Pulmonary Function  Test; Future   DG Chest 2 View; Future   budesonide -formoterol  (SYMBICORT ) 160-4.5 MCG/ACT inhaler; Take 2 puffs first thing in am and then another 2 puffs about 12 hours later.   Albuterol -Budesonide  (AIRSUPRA ) 90-80 MCG/ACT AERO; Inhale 2 puffs into the lungs every 6 (six) hours as needed.   POCT EXHALED NITRIC OXIDE    CBC with Differential/Platelet; Future   IgE; Future   RESPIRATORY ALLERGY PANEL REGION II W/ RFLX: [place]; Future  Encounter for smoking cessation counseling Smoking/Tobacco Cessation Counseling Getting Rx through PCP with wellbutrin.  Anne Olson is a current user of tobacco or nicotine products. She is ready to quit at this time. Counseling provided today addressed the risks of continued use and the benefits of cessation. Discussed tobacco/nicotine use history, readiness to quit, and evidence-based treatment options including behavioral strategies, support resources, and pharmacologic therapies. Provided encouragement and educational materials on steps and resources to quit smoking. Patient questions were addressed, and follow-up recommended for continued support. Total time spent on counseling: 4 minutes.        Return in about 2 months (around 05/06/2024).   Anne Willhoite, MD

## 2024-03-09 ENCOUNTER — Other Ambulatory Visit: Payer: Self-pay

## 2024-03-09 NOTE — Telephone Encounter (Signed)
 A user error has taken place: encounter opened in error, closed for administrative reasons.

## 2024-03-10 NOTE — Progress Notes (Signed)
 [facility]- [facility] Outpatient Visit   Anne Olson  DOB: 07/19/1975  MRN: [id number] 03/11/2024     Subjective:    Anne Olson is a 48 y.o. (DOB 05-03-1976) female.   HPI History of Present Illness The patient is a 48 year old female who presents today with complaints of low back pain. She is accompanied by her wife.  She has been experiencing chronic back pain for approximately 5 to 6 years, which has progressively worsened over time. The pain, initially localized to her lower back, has now spread to her upper back, neck, and left hip. She also reports cramping in her legs and feet that disrupts her sleep. Upon waking, she often feels stiff and experiences varying degrees of pain. Her daily activities are significantly impacted by this constant pain. She also reports occasional numbness and tingling in her legs, describing it as a sensation akin to ants crawling on her skin. This sensation is more pronounced when she experiences cramping in her left thigh and hip. She has a history of a pinched nerve on the left side and bilateral sciatica.   She has not undergone any testing in the past 6 years. She sustained a back injury 6 to 7 years ago, resulting in ruptured discs and a pinched nerve on the left side. She underwent therapy and received injections. She has no history of diabetes. She has difficulty walking on her toes due to foot pain. She reports no recent incidents that could have exacerbated her condition. She avoids push-pull motions and repetitive bending, and exercises to alleviate her symptoms. She has not lifted or pushed anything recently.   She has been trying to manage her pain without medication. She has taken tramadol  and Flexeril  in the past, both of which made her sleepy, so she took them at night. She has been using ibuprofen  for pain relief but had to discontinue it due to stomach issues. Tylenol  Extra Strength  has proven ineffective for her. She occasionally uses a heating pad for relief. She has been prescribed Cymbalta but has not yet started the medication. She has taken Neurontin in the past but discontinued it due to grogginess. She took prednisone  in 11/2023 for her asthma but is unsure if it helped her back pain.  She has plantar fasciitis in both feet and a heel spur in the right foot, which are causing issues with her walking. She is seeing a foot specialist for this.  SOCIAL HISTORY Marital Status: Married   IMAGING Results  Lumbar XR taken and reviewed today showing moderate spondylosis with mild scoliosis curve from L1- L5 with apex to the left. There is diffuse facet arthrosis and disc space narrowing from L4-S1. No acute fractures or listhesis.  Lumbar MRI 2017 reviewed today showing Mild to moderate spondylosis in the lower lumbar spine. Mild canal stenosis at L4-5 and L5-S1 secondary to posterior disc bulges, ligamentum flavum and facet hypertrophy. Associated annular fissures in the dorsal aspects of the L4-5 and L5-S1 discs. At L5-S1, the left S1 nerve root is displaced dorsally by the disc bulge. At this level ligamentum flavum flavum and facet hypertrophy contribute to moderate left and mild right foraminal stenosis.    Reviewed and updated this visit by provider: Tobacco  Allergies  Meds  Problems  Med Hx  Surg Hx  Fam Hx  PDMP       Review of Systems  Constitutional:  Negative for activity change, appetite change, chills, diaphoresis, fatigue, fever  and unexpected weight change.  HENT: Negative.    Eyes: Negative.   Respiratory: Negative.  Negative for shortness of breath.   Cardiovascular:  Negative for chest pain, palpitations and leg swelling.  Gastrointestinal: Negative.   Endocrine: Negative.   Genitourinary: Negative.   Musculoskeletal:  Positive for back pain. Negative for arthralgias, gait problem, joint swelling, myalgias and neck pain.  Skin:  Negative  for color change and wound.  Allergic/Immunologic: Negative.   Neurological:  Negative for weakness, numbness and headaches.  Hematological: Negative.   Psychiatric/Behavioral: Negative.  Negative for hallucinations, self-injury and sleep disturbance.       Objective:   Vitals:   03/10/24 1025  BP: (!) 169/101  Height: 5' 8.5 (1.74 m)  Weight: 117 lb (53.1 kg)  BMI (Calculated): 17.5    Constitutional: She appears healthy. No distress.  Musculoskeletal:        General: No tenderness or edema.     Cervical back: Normal range of motion.     Lumbar back: Positive right straight leg raise test and positive left straight leg raise test.  Neurological: She is alert and oriented to person, place, and time.  Skin: Skin is warm and dry.  Back Exam   Tenderness  The patient is experiencing tenderness in the lumbar (paraspinal musculature).  Range of Motion  Extension:  abnormal Back extension: pain with facet loading. Flexion:  abnormal  Lateral bend right:  normal  Lateral bend left:  normal  Rotation right:  normal  Rotation left:  normal   Muscle Strength  The patient has normal back strength.  Tests  Straight leg raise right: positive Straight leg raise left: positive  Reflexes  Patellar:  normal Achilles:  normal Biceps:  normal  Other  Toe walk: abnormal Heel walk: abnormal Sensation: normal Gait: normal  Erythema: no back redness Scars: absent       Physical Exam Musculoskeletal: Gait: Difficulty walking on tippy toes, more pronounced on the left side. Back: Tenderness noted on palpation, particularly in the upper back region. No fractures observed. Lower extremities: Strength testing showed slight weakness in the left leg compared to the right. Pain noted in lower back upon leg movement. Difficulty walking on tippy toes, more pronounced on the left side.      Assessment / Plan:   ASSESSMENT 1. Back pain, unspecified back location, unspecified back  pain laterality, unspecified chronicity (Primary) -     XR Spine Lumbar Min 4 Views 2. Lumbar radiculopathy -     predniSONE  (DELTASONE ) 10 mg tablet; 10 mg pred pack. Take as directed for 6 days, Normal -     cyclobenzaprine  (FLEXERIL ) 5 mg tablet; Take one tablet to two tablets (5-10 mg dose) by mouth at bedtime as needed for Muscle spasms for up to 30 days., Starting Tue 03/10/2024, Until Thu 04/09/2024 at 2359, Normal -     traMADol  (ULTRAM ) 50 mg tablet; Take one tablet (50 mg dose) by mouth every 8 (eight) hours as needed for Pain for up to 5 days. Max Daily Amount: 150 mg, Starting Tue 03/10/2024, Until Sun 03/15/2024 at 2359, Normal    PLAN Assessment & Plan 1. Low back pain: Her symptoms suggest a possible pinched nerve, contributing to the numbness and tingling radiating from her back. The presence of bulging disks at L4-5 and L5-S1, as seen in her 2017 MRI, could be exacerbating her condition. The x-rays taken today indicate potential arthritic changes in the disk spaces, which could also be causing  nerve irritation on the left side.  A prednisone  pack will be prescribed to manage inflammation and pain. She is advised to take 6 pills on the first day, reducing by one pill each subsequent day until completion. Potential side effects of prednisone , including increased appetite and sleep disturbances, were discussed. She is advised to take the medication in the morning. A prescription for Flexeril  5 mg, up to 10 mg at night as needed, will be provided. She is advised to start with the 5 mg dose and only increase if necessary. A prescription for tramadol  50 mg, 15 tablets, will be provided for use as needed at night. She is advised to discontinue gabapentin and start Cymbalta as prescribed. If there is no improvement in her condition, an MRI may be considered.   Follow-up: A follow-up appointment will be scheduled in 3 weeks.    Questions were encouraged and answered. Patient was instructed to  call with any new concerns or problems.  Depression screening, including interpretation, was performed.  Total time spent performing and reviewing the screening was > 5 minutes.  Risks, benefits, and alternatives of the medications and treatment plan prescribed today were discussed, and patient expressed understanding. Plan follow-up as discussed or as needed if any worsening symptoms or change in condition.    Elveria Hoit, PA   Documentation for time-based billing:  Total time spent of date of service was 45 minutes.  Patient care activities included preparing to see the patient such as reviewing the patient record, performing a medically appropriate history and physical examination, counseling and educating the patient, family, and/or caregiver, ordering prescription medications, tests, or procedures, and documenting clinical information in the electronic or other health record.

## 2024-03-11 LAB — RESPIRATORY ALLERGY PANEL REGION II W/ RFLX: ~~LOC~~
Allergen, A. alternata, m6: <0.1 kU/L
Allergen, Cedar tree, t12: <0.1 kU/L
Allergen, Comm Silver Birch, t9: <0.1 kU/L
Allergen, Cottonwood, t14: <0.1 kU/L
Allergen, Mouse Urine Protein, e78: <0.1 kU/L
Allergen, Mulberry, t76: <0.1 kU/L
Allergen, Oak,t7: <0.1 kU/L
Allergen, P. notatum, m1: <0.1 kU/L
Aspergillus fumigatus, m3: <0.1 kU/L
Bermuda Grass: <0.1 kU/L
Box Elder IgE: <0.1 kU/L
CLADOSPORIUM HERBARUM (M2) IGE: <0.1 kU/L
COMMON RAGWEED (SHORT) (W1) IGE: <0.1 kU/L
Cat Dander: <0.1 kU/L
Class: 0
Class: 0
Class: 0
Class: 0
Class: 0
Class: 0
Class: 0
Class: 0
Class: 0
Class: 0
Class: 0
Class: 0
Class: 0
Class: 0
Class: 0
Class: 0
Class: 0
Class: 0
Class: 0
Class: 0
Class: 0
Class: 0
Class: 0
Class: 0
Cockroach: <0.1 kU/L
D. farinae: <0.1 kU/L
Dog Dander: <0.1 kU/L
Elm IgE: <0.1 kU/L
IgE (Immunoglobulin E), Serum: 22 kU/L (ref <=114)
IgE (Immunoglobulin E), Serum: <22 kU/L (ref <=114)
Johnson Grass: <0.1 kU/L
Pecan/Hickory Tree IgE: <0.1 kU/L
Rough Pigweed  IgE: <0.1 kU/L
Sheep Sorrel IgE: <0.1 kU/L
Timothy Grass: <0.1 kU/L

## 2024-03-11 LAB — INTERPRETATION:

## 2024-03-12 ENCOUNTER — Telehealth: Payer: Self-pay

## 2024-03-12 ENCOUNTER — Ambulatory Visit: Payer: Self-pay

## 2024-03-12 DIAGNOSIS — R918 Other nonspecific abnormal finding of lung field: Secondary | ICD-10-CM

## 2024-03-12 NOTE — Telephone Encounter (Signed)
 Chest x-ray read as suspicious right lower lobe pneumonia.  On my review the changes are minuscule.  Left a voicemail to patient's phone as well as message to her to call our office if she feels she is having worsening shortness of breath, fevers or increased cough.  However getting a repeat chest x-ray per recommendation in 6 weeks.

## 2024-03-12 NOTE — Telephone Encounter (Signed)
 Has the patient previously tried and failed other medications for their diagnosis? If yes, please list the medication(s) previously tried for this diagnosis AND the outcome(s).

## 2024-03-12 NOTE — Telephone Encounter (Signed)
 She has tried and failed Symbicort  inhaler and Pulmicort  nebs.  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

## 2024-03-12 NOTE — Telephone Encounter (Signed)
*  Pulm  Pharmacy Patient Advocate Encounter   Received notification from Fax that prior authorization for Airsupra  90-80MCG/ACT aerosol  is required/requested.   Insurance verification completed.   The patient is insured through [facility].   Per test claim: PA required; PA started via CoverMyMeds. KEY [id number] . Waiting for clinical questions to populate.

## 2024-03-13 NOTE — Telephone Encounter (Signed)
 Your request has been denied Denied. Per the health plan's preferred drug list, at least 2 preferred drugs must be tried before requesting this drug or tell us  why the member cannot try any preferred alternatives. Please send us  supporting chart notes and lab results. Here is list of preferred alternatives: Advair Diskus, Advair HFA Inhler, Dulera, Symbicort . Per our records, the member has already tried Symbicort . Note: Some preferred drug(s) may have quantity limits. Refer to the health plan's preferred drug list for additional details.

## 2024-03-13 NOTE — Telephone Encounter (Signed)
 Submitted with additional information. Pending determination

## 2024-03-16 NOTE — Telephone Encounter (Signed)
 Denial letter attached in patients chart.

## 2024-03-17 NOTE — Telephone Encounter (Signed)
 Copied from CRM #[id number]. Topic: Clinical - Prescription Issue >> Mar 12, 2024 11:00 AM Anne Olson wrote: Reason for CRM: Nadeen with [facility] stated they are needing prior authorization for the medicine Albuterol -Budesonide  (AIRSUPRA ) 90-80 MCG/ACT AERO. Pt stated the pharmacy has been sending information for prior authorization to have this medicine refilled.  Please advise Dr. Theodoro as PA was denied

## 2024-03-18 NOTE — Telephone Encounter (Signed)
 ATC x1. No answer- left vm to call back.

## 2024-03-19 ENCOUNTER — Telehealth: Payer: Self-pay

## 2024-03-19 NOTE — Telephone Encounter (Signed)
 Copied from CRM [id number]. Topic: Clinical - Prescription Issue >> Mar 18, 2024  2:11 PM Anne Olson wrote: Reason for CRM: Patient returning [place] phone call regarding inhaler, contacted [facility] spoke with Almeda advised to send CRM. Advised patient f/u call would be made, patient was thankful and verbalized understanding.  Mychart message was sent and read

## 2024-04-07 ENCOUNTER — Other Ambulatory Visit: Payer: Self-pay

## 2024-04-07 NOTE — Progress Notes (Unsigned)
 Anne Olson [id number] 01/01/1976   Chief Complaint:  Referring Provider: Lenon Nell SAILOR, FNP Primary GI MD: Sampson  HPI: Anne Olson is a 48 y.o. female with past medical history of HTN, asthma, tachycardia on metoprolol , tobacco dependence, vitamin D deficiency who presents today for a complaint of abdominal pain.    [facility]es [facility].  Per referral note has had achy abdominal pain, bloating, as well as unintentional weight loss.  Was referred to GI, as well as pulmonology for further evaluation.  Labs 02/24/2024: Normal TSH, normal CBC, unremarkable CMP with normal liver enzymes, normal lipid panel, hemoglobin A1c 5.8, low vitamin D  Recent chest x-ray suspicious for right lower lobe pneumonia, repeat chest x-ray recommended in 6 weeks.  Discussed the use of AI scribe software for clinical note transcription with the patient, who gave verbal consent to proceed.  History of Present Illness       Previous GI Procedures/Imaging      Past Medical History:  Diagnosis Date   Anemia    iron supplements in the past   Anxiety    has been prescribed Xanax   Asthma    has albuterol , advair prn, no attack x 2 years;triggered by strong smells (bleach and smoke)   Benign breast cyst in female 1995   had bx done was normal   DUB (dysfunctional uterine bleeding) 1995   Family hx cleft lip 04/29/2012   FOB's son    Gestational Hypertension 07/17/2012   Headache(784.0)    during and prior to pregnancy;usually Rt side of head;can effect vision   Heart murmur    Hx of preeclampsia, prior pregnancy, currently pregnant 03/12/2012   Induced at 38wks, no meds     Infection    Yeast inf;gets freq w/ antibxs or scented soaps   Infection    BV;recently treated for BV completed Flagyl    Memory loss 2013   Currently having difficult time remembering things   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   Ovarian cyst in pregnancy 2013    seen on recent US    Preeclampsia 1994   induced @ 38 weeks   Scoliosis     Past Surgical History:  Procedure Laterality Date   DILATION AND EVACUATION N/A 09/29/2012   Procedure: DILATATION AND EVACUATION;  Surgeon: Winton Felt, MD;  Location: [facility];  Service: Gynecology;  Laterality: N/A;   MULTIPLE EXTRACTIONS WITH ALVEOLOPLASTY Bilateral 05/04/2020   Procedure: MULTIPLE EXTRACTION WITH ALVEOLOPLASTY;  Surgeon: Sheryle Hamilton, DDS;  Location: [facility] OR;  Service: Oral Surgery;  Laterality: Bilateral;   WISDOM TOOTH EXTRACTION  05/2011   all 4 removed    Current Outpatient Medications  Medication Sig Dispense Refill   albuterol  (PROVENTIL ) (2.5 MG/3ML) 0.083% nebulizer solution SMARTSIG:3 Milliliter(s) 3 Times Daily PRN     Albuterol -Budesonide  (AIRSUPRA ) 90-80 MCG/ACT AERO Inhale 2 puffs into the lungs every 6 (six) hours as needed. 21.4 g 4   atropine 1 % ophthalmic solution Apply to eye.     brimonidine (ALPHAGAN) 0.2 % ophthalmic solution      budesonide -formoterol  (SYMBICORT ) 160-4.5 MCG/ACT inhaler Take 2 puffs first thing in am and then another 2 puffs about 12 hours later. 10.2 each 12   buPROPion (WELLBUTRIN SR) 150 MG 12 hr tablet Take 150 mg by mouth 2 (two) times daily.     cyclobenzaprine  (FLEXERIL ) 10 MG tablet Take 1 tablet (10 mg total) by mouth at bedtime as needed for muscle  spasms. Office visit needed for additional refills. 1st notice. (Patient not taking: Reported on 03/06/2024) 15 tablet 0   dorzolamide (TRUSOPT) 2 % ophthalmic solution INSTILL 1 DROP INTO RIGHT EYE TWICE DAILY     dorzolamide-timolol (COSOPT) 2-0.5 % ophthalmic solution SMARTSIG:In Eye(s)     DULoxetine (CYMBALTA) 20 MG capsule Take 20 mg by mouth.     EPINEPHrine  0.3 mg/0.3 mL IJ SOAJ injection Inject 0.3 mg into the muscle as needed.     etonogestrel  (NEXPLANON ) 68 MG IMPL implant 1 each by Subdermal route once.     fluticasone (FLONASE) 50 MCG/ACT nasal spray Place 2 sprays into both nostrils  daily.     gabapentin (NEURONTIN) 300 MG capsule Take 300 mg by mouth 3 (three) times daily as needed (Pain).      hydrochlorothiazide (HYDRODIURIL) 12.5 MG tablet Take 12.5 mg by mouth daily.     ibuprofen  (ADVIL ,MOTRIN ) 800 MG tablet Take 800 mg by mouth every 8 (eight) hours as needed for mild pain or moderate pain.      levocetirizine (XYZAL) 5 MG tablet Take 5 mg by mouth daily.     lidocaine  (LIDODERM ) 5 % Place 1 patch onto the skin every 12 (twelve) hours as needed (Back pain). Remove & Discard patch within 12 hours or as directed by MD      methocarbamol  (ROBAXIN ) 500 MG tablet Take 1 tablet (500 mg total) by mouth 2 (two) times daily. (Patient not taking: Reported on 03/06/2024) 20 tablet 0   metoprolol  succinate (TOPROL -XL) 25 MG 24 hr tablet Take 25 mg by mouth daily.     metoprolol  tartrate (LOPRESSOR ) 25 MG tablet Take 1 tablet once daily as needed for palpitations. (Patient taking differently: Take 25 mg by mouth daily as needed (palpitations.).) 180 tablet 3   omeprazole (PRILOSEC) 20 MG capsule Take 20 mg by mouth daily.     oxyCODONE -acetaminophen  (PERCOCET/ROXICET) 5-325 MG tablet Take 1-2 tablets by mouth every 6 (six) hours as needed for severe pain. (Patient not taking: Reported on 03/06/2024) 20 tablet 0   predniSONE  (DELTASONE ) 20 MG tablet Take 1.5 tablets (30 mg total) by mouth daily with breakfast. 15 tablet 0   QULIPTA 60 MG TABS Take 1 tablet by mouth daily.     tobramycin-dexamethasone  (TOBRADEX) ophthalmic solution INSTILL 1 DROP INTO AFFECTED EYE(S) BY OPHTHALMIC ROUTE BID x 7 days     traMADol  (ULTRAM ) 50 MG tablet Take 50 mg by mouth 3 (three) times daily as needed.     Ubrogepant (UBRELVY) 100 MG TABS Take 100 mg by mouth.     VENTOLIN  HFA 108 (90 Base) MCG/ACT inhaler Inhale into the lungs.     Vitamin D, Ergocalciferol, (DRISDOL) 1.25 MG (50000 UNIT) CAPS capsule Take 50,000 Units by mouth 2 (two) times a week. (Patient taking differently: Take 50,000 Units by  mouth once a week.)     No current facility-administered medications for this visit.    Allergies as of 04/08/2024 - Review Complete 03/06/2024  Allergen Reaction Noted   Bee venom Anaphylaxis and Swelling 06/23/2012   Other Dermatitis, Anaphylaxis, Hives, and Other (See Comments) 06/16/2012   Tomato Anaphylaxis and Hives 06/16/2012    Family History  Problem Relation Age of Onset   Cancer Mother        Throat   Seizures Mother    Ulcers Mother    Pancreatitis Mother    Cancer Father        Throat   Hypertension Father  Thyroid  disease Father        Hyperthyoid   Pancreatitis Father    COPD Father    Cirrhosis Father        Liver   Asthma Sister    Asthma Brother    Hypertension Paternal Grandmother    Heart failure Paternal Grandmother    Hypertension Paternal Aunt    Diabetes Maternal Aunt    Asthma Cousin    Asthma Paternal Aunt    Stroke Maternal Uncle    Stroke Paternal Uncle        x 2   Sickle cell trait Other        Nephew    Social History   Tobacco Use   Smoking status: Every Day    Current packs/day: 0.25    Average packs/day: 0.3 packs/day for 10.0 years (2.5 ttl pk-yrs)    Types: Cigarettes   Smokeless tobacco: Never   Tobacco comments:    Has smoked for 20 years, about 1/4 ppd  Substance Use Topics   Alcohol use: Yes    Comment: Occasionally   Drug use: No     Review of Systems:    Constitutional: No weight loss, fever, chills, weakness or fatigue Eyes: No change in vision Ears, Nose, Throat:  No change in hearing or congestion Skin: No rash or itching Cardiovascular: No chest pain, chest pressure or palpitations   Respiratory: No SOB or cough Gastrointestinal: See HPI and otherwise negative Genitourinary: No dysuria or change in urinary frequency Neurological: No headache, dizziness or syncope Musculoskeletal: No new muscle or joint pain Hematologic: No bleeding or bruising    Physical Exam:  Vital signs: There were no vitals  taken for this visit.  Constitutional: NAD, Well developed, Well nourished, alert and cooperative Head:  Normocephalic and atraumatic.  Eyes: No scleral icterus. Conjunctiva pink. Mouth: No oral lesions. Respiratory: Respirations even and unlabored. Lungs clear to auscultation bilaterally.  No wheezes, crackles, or rhonchi.  Cardiovascular:  Regular rate and rhythm. No murmurs. No peripheral edema. Gastrointestinal:  Soft, nondistended, nontender. No rebound or guarding. Normal bowel sounds. No appreciable masses or hepatomegaly. Rectal:  Not performed.  Neurologic:  Alert and oriented x4;  grossly normal neurologically.  Skin:   Dry and intact without significant lesions or rashes. Psychiatric: Oriented to person, place and time. Demonstrates good judgement and reason without abnormal affect or behaviors.   RELEVANT LABS AND IMAGING: CBC    Component Value Date/Time   WBC 5.0 03/06/2024 1028   RBC 4.81 03/06/2024 1028   HGB 13.5 03/06/2024 1028   HCT 41.1 03/06/2024 1028   PLT 221.0 03/06/2024 1028   MCV 85.5 03/06/2024 1028   MCV 90.3 09/08/2016 1604   MCH 28.1 05/04/2020 0653   MCHC 32.8 03/06/2024 1028   RDW 15.1 03/06/2024 1028   LYMPHSABS 1.7 03/06/2024 1028   MONOABS 0.5 03/06/2024 1028   EOSABS 0.0 03/06/2024 1028   BASOSABS 0.0 03/06/2024 1028    CMP     Component Value Date/Time   NA 136 05/04/2020 0653   NA 141 09/08/2016 1539   K 3.7 05/04/2020 0653   CL 106 05/04/2020 0653   CO2 23 05/04/2020 0653   GLUCOSE 94 05/04/2020 0653   BUN 6 05/04/2020 0653   BUN 6 09/08/2016 1539   CREATININE 0.83 05/04/2020 0653   CREATININE 0.43 (L) 08/07/2012 1145   CALCIUM 8.8 (L) 05/04/2020 0653   PROT 7.3 06/07/2016 1740   ALBUMIN 4.2 06/07/2016 1740   AST  11 06/07/2016 1740   ALT 6 06/07/2016 1740   ALKPHOS 86 06/07/2016 1740   BILITOT <0.2 06/07/2016 1740   GFRNONAA >60 05/04/2020 0653   GFRAA 113 09/08/2016 1539     Assessment/Plan:       Camie Furbish,  [facility] 04/07/2024, 7:42 PM  Patient Care Team: Wilkie Majel RAMAN, FNP as PCP - General (Nurse Practitioner)

## 2024-04-08 ENCOUNTER — Other Ambulatory Visit (INDEPENDENT_AMBULATORY_CARE_PROVIDER_SITE_OTHER)

## 2024-04-08 ENCOUNTER — Ambulatory Visit (INDEPENDENT_AMBULATORY_CARE_PROVIDER_SITE_OTHER): Admitting: Gastroenterology

## 2024-04-08 ENCOUNTER — Encounter: Payer: Self-pay | Admitting: Gastroenterology

## 2024-04-08 ENCOUNTER — Telehealth: Payer: Self-pay

## 2024-04-08 VITALS — BP 110/80 | HR 116 | Ht 68.5 in | Wt 118.0 lb

## 2024-04-08 DIAGNOSIS — R6881 Early satiety: Secondary | ICD-10-CM | POA: Diagnosis not present

## 2024-04-08 DIAGNOSIS — R112 Nausea with vomiting, unspecified: Secondary | ICD-10-CM

## 2024-04-08 DIAGNOSIS — R634 Abnormal weight loss: Secondary | ICD-10-CM | POA: Diagnosis not present

## 2024-04-08 DIAGNOSIS — R140 Abdominal distension (gaseous): Secondary | ICD-10-CM

## 2024-04-08 DIAGNOSIS — R1013 Epigastric pain: Secondary | ICD-10-CM

## 2024-04-08 LAB — LIPASE: Lipase: 9 U/L — ABNORMAL LOW (ref 11.0–59.0)

## 2024-04-08 NOTE — Telephone Encounter (Signed)
 Clearance letter faxed to pulmonology Dr Theodoro.

## 2024-04-08 NOTE — Patient Instructions (Signed)
 Your provider has requested that you go to the basement level for lab work before leaving today. Press B on the elevator. The lab is located at the first door on the left as you exit the elevator.   You have been scheduled for an abdominal ultrasound at [facility] (1st floor of hospital) on 04/15/24 at 10 am. Please arrive 30 minutes prior to your appointment for registration. Make certain not to have anything to eat or drink 6-8 hours prior to your appointment. Should you need to reschedule your appointment, please contact radiology at [phone]. This test typically takes about 30 minutes to perform.  You have been scheduled for an endoscopy. Please follow written instructions given to you at your visit today.  If you use inhalers (even only as needed), please bring them with you on the day of your procedure.  If you take any of the following medications, they will need to be adjusted prior to your procedure:   DO NOT TAKE 7 DAYS PRIOR TO TEST- Trulicity (dulaglutide) Ozempic, Wegovy (semaglutide) Mounjaro (tirzepatide) Bydureon Bcise (exanatide extended release)  DO NOT TAKE 1 DAY PRIOR TO YOUR TEST Rybelsus (semaglutide) Adlyxin (lixisenatide) Victoza (liraglutide) Byetta (exanatide) ___________________________________________________________________________  Your provider has ordered [employer] stool testing for you. You have received a kit from our office today containing all necessary supplies to complete this test. Please carefully read the stool collection instructions provided in the kit before opening the accompanying materials. In addition, be sure there is a label providing your full name and date of birth on the puritan opti-swab tube that is supplied in the kit (if you do not see a label with this information on your test tube, please make us  aware before test collection!). After completing the test, you should secure the purtian tube into the specimen biohazard  bag. The [facility] E-Req sheet (including date and time of specimen collection) should be placed into the outside pocket of the specimen biohazard bag and returned to the [facility] lab (basement floor of [facility]) within 3 days of collection. Please make sure to give the specimen to a staff member at the lab. DO NOT leave the specimen on the counter.   If the specimen date and time (can be found in the upper right boxed portion of the sheet) are not filled out on the E-Req sheet, the test will NOT be performed.

## 2024-04-08 NOTE — Progress Notes (Signed)
 Attending Physician's Attestation   I have reviewed the chart.   I agree with the Advanced Practitioner's note, impression, and recommendations with any updates as below. Agree with need for endoscopic workup, once we know no further optimization is needed by Pulmonary.  If in interim the U/S is unremarkable, then while awaiting EGD, then we could offer CTAP with IV/PO contrast as well.   Aloha Finner, MD [facility] Advanced [facility]oscopy Office # [phone]

## 2024-04-09 LAB — IGA: Immunoglobulin A: 599 mg/dL — ABNORMAL HIGH (ref 47–310)

## 2024-04-09 LAB — TISSUE TRANSGLUTAMINASE, IGA: (tTG) Ab, IgA: <1 U/mL

## 2024-04-15 ENCOUNTER — Ambulatory Visit: Payer: Self-pay | Admitting: Gastroenterology

## 2024-04-15 ENCOUNTER — Ambulatory Visit (HOSPITAL_COMMUNITY)
Admission: RE | Admit: 2024-04-15 | Discharge: 2024-04-15 | Disposition: A | Discharge status: Home / Self Care | Source: Ambulatory Visit | Attending: Gastroenterology | Admitting: Gastroenterology

## 2024-04-15 DIAGNOSIS — R112 Nausea with vomiting, unspecified: Secondary | ICD-10-CM | POA: Insufficient documentation

## 2024-04-15 DIAGNOSIS — R140 Abdominal distension (gaseous): Secondary | ICD-10-CM | POA: Insufficient documentation

## 2024-04-15 DIAGNOSIS — R1013 Epigastric pain: Secondary | ICD-10-CM | POA: Diagnosis present

## 2024-04-15 DIAGNOSIS — R634 Abnormal weight loss: Secondary | ICD-10-CM | POA: Diagnosis present

## 2024-04-15 DIAGNOSIS — R6881 Early satiety: Secondary | ICD-10-CM | POA: Diagnosis present

## 2024-04-16 ENCOUNTER — Telehealth: Payer: Self-pay | Admitting: Gastroenterology

## 2024-04-16 DIAGNOSIS — R634 Abnormal weight loss: Secondary | ICD-10-CM

## 2024-04-16 DIAGNOSIS — R112 Nausea with vomiting, unspecified: Secondary | ICD-10-CM

## 2024-04-16 DIAGNOSIS — R93429 Abnormal radiologic findings on diagnostic imaging of unspecified kidney: Secondary | ICD-10-CM

## 2024-04-16 DIAGNOSIS — R109 Unspecified abdominal pain: Secondary | ICD-10-CM

## 2024-04-16 DIAGNOSIS — R1013 Epigastric pain: Secondary | ICD-10-CM

## 2024-04-16 NOTE — Telephone Encounter (Signed)
 Received note from schedulers that radiologist called regarding the abd US  have resulted. Routing to provider to review.   MPRESSION: Questionable hypoechoic mass versus summation artifact of the superior pole of the LEFT kidney measuring 30 mm. Given reported abdominal pain, recommend further evaluation with dedicated renal protocol CT or MRI with and without contrast to exclude underlying mass.

## 2024-04-16 NOTE — Telephone Encounter (Signed)
 [facility] radiology calling to advise the US  abdomen complete report is back. Please advise.

## 2024-04-17 NOTE — Telephone Encounter (Signed)
 Pt returned call. Discussed US  results and provider recommendations. Pt in agreement for renal CT. Orders placed. Message sent to schedulers.

## 2024-04-17 NOTE — Telephone Encounter (Signed)
 Attempted to reach patient. No answer, left VM for patient to return call.

## 2024-04-23 ENCOUNTER — Telehealth: Payer: Self-pay | Admitting: Gastroenterology

## 2024-04-23 NOTE — Telephone Encounter (Signed)
LMTCB in regards to results.  °

## 2024-04-23 NOTE — Telephone Encounter (Signed)
 [employer] H. pylori results received.  Negative for H. pylori 04/16/2024.

## 2024-04-24 NOTE — Telephone Encounter (Signed)
 Patient returning call.

## 2024-04-24 NOTE — Telephone Encounter (Signed)
 Pt informed via VM and portal msg of negative results.

## 2024-05-04 ENCOUNTER — Ambulatory Visit (INDEPENDENT_AMBULATORY_CARE_PROVIDER_SITE_OTHER)

## 2024-05-04 VITALS — BP 147/91 | HR 103 | Temp 97.6°F | Ht 67.75 in | Wt 121.2 lb

## 2024-05-04 DIAGNOSIS — J4489 Other specified chronic obstructive pulmonary disease: Secondary | ICD-10-CM | POA: Diagnosis not present

## 2024-05-04 DIAGNOSIS — Z716 Tobacco abuse counseling: Secondary | ICD-10-CM

## 2024-05-04 DIAGNOSIS — Z23 Encounter for immunization: Secondary | ICD-10-CM | POA: Diagnosis not present

## 2024-05-04 DIAGNOSIS — F17210 Nicotine dependence, cigarettes, uncomplicated: Secondary | ICD-10-CM | POA: Diagnosis not present

## 2024-05-04 DIAGNOSIS — J4540 Moderate persistent asthma, uncomplicated: Secondary | ICD-10-CM | POA: Diagnosis not present

## 2024-05-04 LAB — PULMONARY FUNCTION TEST
DL/VA % pred: 78 %
DL/VA: 3.29 ml/min/mmHg/L
DLCO unc % pred: 51 %
DLCO unc: 12.29 ml/min/mmHg
FEF 25-75 Post: 0.54 L/s
FEF 25-75 Pre: 0.9 L/s
FEF2575-%Change-Post: -40 %
FEF2575-%Pred-Post: 17 %
FEF2575-%Pred-Pre: 29 %
FEV1-%Change-Post: -14 %
FEV1-%Pred-Post: 47 %
FEV1-%Pred-Pre: 55 %
FEV1-Post: 1.52 L
FEV1-Pre: 1.78 L
FEV1FVC-%Change-Post: -1 %
FEV1FVC-%Pred-Pre: 79 %
FEV6-%Change-Post: -12 %
FEV6-%Pred-Post: 60 %
FEV6-%Pred-Pre: 69 %
FEV6-Post: 2.39 L
FEV6-Pre: 2.73 L
FEV6FVC-%Change-Post: 0 %
FEV6FVC-%Pred-Post: 100 %
FEV6FVC-%Pred-Pre: 100 %
FVC-%Change-Post: -13 %
FVC-%Pred-Post: 60 %
FVC-%Pred-Pre: 69 %
FVC-Post: 2.43 L
FVC-Pre: 2.8 L
Post FEV1/FVC ratio: 63 %
Post FEV6/FVC ratio: 98 %
Pre FEV1/FVC ratio: 64 %
Pre FEV6/FVC Ratio: 98 %
RV % pred: 147 %
RV: 2.86 L
TLC % pred: 96 %
TLC: 5.41 L

## 2024-05-04 NOTE — Patient Instructions (Signed)
 Notification of test results are managed in the following manner: If there are any recommendations or changes to the plan of care discussed in office today, we will contact you and let you know what they are. If you do not hear from us , then your results are normal/expected and you can view them through your MyChart account, or a letter will be sent to you. Thank you again for trusting us  with your care [facility].   Cont with symbicort  and prn albuterol .

## 2024-05-04 NOTE — Progress Notes (Signed)
 New Patient Pulmonology Office Visit   Subjective:  Patient ID: Anne Olson, female    DOB: [birth year]  MRN: [id number]  Referred by: Theodoro Lakes, MD  CC:  Chief Complaint  Patient presents with   Shortness of Breath    SOB with exertion such as walking up steps.    Shortness of Breath   Anne Olson is a 48 y.o. female with asthma/COPD overlap, prior pneumonias, anxiety/depression follows for asthma.  03/06/24> perennial allergies with uncontrolled asthma> new symbicort  +prn.    Discussed the use of AI scribe software for clinical note transcription with the patient, who gave verbal consent to proceed.  She got steroids for spine issues. Has been using symbicort  twice daily. Improved symptoms with it. No steroid use since last visit.  Still using albuterol  1 perday. Before symbicort  was using it 3-4 times/day.  Reduced smoking. Now to 1 cig/day. Still on wellbutrin.  Cough at night and morning. Clear phlegm. NO fever.  Overall breathing is better than before.  PEFR 242 today.   ASTHMA:  First diagnosed: teens.  FH: sister Triggers: chlorox wipes, vicks vapor. No trigger to season change.  Intubated: none.  Best PEFR: last done many years ago. Does not remember.  Last steroid use: multiple times in the past. Last use June 2025. 2-3 times/year use.  Others: pregnancy- worsening w 1st pregnancy, allergy  symptoms- yes, throat choking/stridor- no, GERD- no but on prilosec, OSA- no  Lung Health: Functional status: <1 block.  Covid vaccine: UTD w COVId. Influenza vaccine: wants.  Pneumonococcal vaccine: will give prevnar 20.  Smoking: 1 cig/day currently , prior 1/2 pk for at least 25 years.  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   Testing: Labs: No eosinophilia. IgE 22. No allergies on panel.   CT chest 2016 reviewed by me: Motion artifact.  Bibasilar ground glass opacities likely atelectasis likely underlying emphysematous changes.  No  PE.   PFT 05/04/24> mod obs *(FEV1 55% pred) +airtrapping, w severe reduction in DLCO. No BD response.   Asthma Control Test ACT Total Score  03/06/2024  9:35 AM 10    Allergies: Bee venom, Other, and Tomato  History: Past Medical History:  Diagnosis Date   Anemia    iron supplements in the past   Anxiety    has been prescribed Xanax   Asthma    has albuterol , advair prn, no attack x 2 years;triggered by strong smells (bleach and smoke)   Benign breast cyst in female 1995   had bx done was normal   DUB (dysfunctional uterine bleeding) 1995   Family hx cleft lip 04/29/2012   FOB's son    Gestational Hypertension 07/17/2012   Headache(784.0)    during and prior to pregnancy;usually Rt side of head;can effect vision   Heart murmur    Hx of preeclampsia, prior pregnancy, currently pregnant 03/12/2012   Induced at 38wks, no meds     Hypertension    Infection    Yeast inf;gets freq w/ antibxs or scented soaps   Infection    BV;recently treated for BV completed Flagyl    Memory loss 2013   Currently having difficult time remembering things   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   Ovarian cyst in pregnancy 2013   seen on recent US    Preeclampsia 1994   induced @ 38 weeks   Scoliosis    Past Surgical History:  Procedure Laterality Date   DILATION AND  EVACUATION N/A 09/29/2012   Procedure: DILATATION AND EVACUATION;  Surgeon: Winton Felt, MD;  Location: [facility];  Service: Gynecology;  Laterality: N/A;   MULTIPLE EXTRACTIONS WITH ALVEOLOPLASTY Bilateral 05/04/2020   Procedure: MULTIPLE EXTRACTION WITH ALVEOLOPLASTY;  Surgeon: Sheryle Hamilton, DDS;  Location: [facility];  Service: Oral Surgery;  Laterality: Bilateral;   WISDOM TOOTH EXTRACTION  05/2011   all 4 removed   Family History  Problem Relation Age of Onset   Cancer Mother        Throat   Seizures Mother    Ulcers Mother    Pancreatitis Mother    Cancer Father        Throat   Hypertension Father     Thyroid  disease Father        Hyperthyoid   Pancreatitis Father    COPD Father    Cirrhosis Father        Liver   Asthma Sister    Asthma Brother    Hypertension Paternal Grandmother    Heart failure Paternal Grandmother    Hypertension Paternal Aunt    Diabetes Maternal Aunt    Asthma Cousin    Asthma Paternal Aunt    Stroke Maternal Uncle    Stroke Paternal Uncle        x 2   Sickle cell trait Other        Nephew   Social History   Socioeconomic History   Marital status: Significant Other    Spouse name: Not on file   Number of children: 1   Years of education: 18   Highest education level: Not on file  Occupational History   Occupation: Resident Care Assistant/Med tech  Tobacco Use   Smoking status: Every Day    Current packs/day: 0.25    Average packs/day: 0.3 packs/day for 10.0 years (2.5 ttl pk-yrs)    Types: Cigarettes   Smokeless tobacco: Never   Tobacco comments:    Has smoked for 20 years, about 1/4 ppd  Substance and Sexual Activity   Alcohol use: Yes    Comment: Occasionally   Drug use: No   Sexual activity: Never    Partners: Male    Birth control/protection: Implant, None    Comment: Has Norplant in Lt arm that is still in place - placed in 1994  Other Topics Concern   Not on file  Social History Narrative   Not on file   Social Drivers of Health   Financial Resource Strain: Low Risk  (03/10/2024)   Received from [facility]   Overall Financial Resource Strain (CARDIA)    How hard is it for you to pay for the very basics like food, housing, medical care, and heating?: Not hard at all  Food Insecurity: No Food Insecurity (03/10/2024)   Received from [facility]   Hunger Vital Sign    Within the past 12 months, you worried that your food would run out before you got the money to buy more.: Never true    Within the past 12 months, the food you bought just didn't last and you didn't have money to get more.: Never true  Transportation Needs:  No Transportation Needs (03/10/2024)   Received from [facility] - Transportation    In the past 12 months, has lack of transportation kept you from medical appointments or from getting medications?: No    In the past 12 months, has lack of transportation kept you from meetings, work, or from getting things  needed for daily living?: No  Physical Activity: Not on file  Stress: Not on file  Social Connections: Not on file  Intimate Partner Violence: Not on file    Immunization status: Immunization History  Administered Date(s) Administered   Influenza Split 04/30/2012   Influenza,inj,Quad PF,6+ Mos 05/21/2015   Pfizer(Comirnaty)Fall Seasonal Vaccine 12 years and older 04/26/2023   Pneumococcal Polysaccharide-23 08/14/2012   Tdap 07/31/2012, 12/21/2021    Current Meds:  Current Outpatient Medications:    albuterol  (PROVENTIL ) (2.5 MG/3ML) 0.083% nebulizer solution, SMARTSIG:3 Milliliter(s) 3 Times Daily PRN, Disp: , Rfl:    atropine 1 % ophthalmic solution, Apply to eye., Disp: , Rfl:    brimonidine (ALPHAGAN) 0.2 % ophthalmic solution, , Disp: , Rfl:    budesonide -formoterol  (SYMBICORT ) 160-4.5 MCG/ACT inhaler, Take 2 puffs first thing in am and then another 2 puffs about 12 hours later., Disp: 10.2 each, Rfl: 12   buPROPion (WELLBUTRIN SR) 150 MG 12 hr tablet, Take 150 mg by mouth 2 (two) times daily., Disp: , Rfl:    cyclobenzaprine  (FLEXERIL ) 10 MG tablet, Take 1 tablet (10 mg total) by mouth at bedtime as needed for muscle spasms. Office visit needed for additional refills. 1st notice., Disp: 15 tablet, Rfl: 0   dorzolamide (TRUSOPT) 2 % ophthalmic solution, INSTILL 1 DROP INTO RIGHT EYE TWICE DAILY, Disp: , Rfl:    dorzolamide-timolol (COSOPT) 2-0.5 % ophthalmic solution, SMARTSIG:In Eye(s), Disp: , Rfl:    DULoxetine (CYMBALTA) 20 MG capsule, Take 20 mg by mouth., Disp: , Rfl:    EPINEPHrine  0.3 mg/0.3 mL IJ SOAJ injection, Inject 0.3 mg into the muscle as needed.,  Disp: , Rfl:    etonogestrel  (NEXPLANON ) 68 MG IMPL implant, 1 each by Subdermal route once., Disp: , Rfl:    fluticasone (FLONASE) 50 MCG/ACT nasal spray, Place 2 sprays into both nostrils daily., Disp: , Rfl:    gabapentin (NEURONTIN) 300 MG capsule, Take 300 mg by mouth 3 (three) times daily as needed (Pain). , Disp: , Rfl:    hydrochlorothiazide (HYDRODIURIL) 12.5 MG tablet, Take 12.5 mg by mouth daily., Disp: , Rfl:    ibuprofen  (ADVIL ,MOTRIN ) 800 MG tablet, Take 800 mg by mouth every 8 (eight) hours as needed for mild pain or moderate pain. , Disp: , Rfl:    levocetirizine (XYZAL) 5 MG tablet, Take 5 mg by mouth daily., Disp: , Rfl:    lidocaine  (LIDODERM ) 5 %, Place 1 patch onto the skin every 12 (twelve) hours as needed (Back pain). Remove & Discard patch within 12 hours or as directed by MD , Disp: , Rfl:    metoprolol  succinate (TOPROL -XL) 25 MG 24 hr tablet, Take 25 mg by mouth daily., Disp: , Rfl:    metoprolol  tartrate (LOPRESSOR ) 25 MG tablet, Take 1 tablet once daily as needed for palpitations. (Patient taking differently: Take 25 mg by mouth daily as needed (palpitations.).), Disp: 180 tablet, Rfl: 3   omeprazole (PRILOSEC) 20 MG capsule, Take 20 mg by mouth daily., Disp: , Rfl:    predniSONE  (DELTASONE ) 20 MG tablet, Take 1.5 tablets (30 mg total) by mouth daily with breakfast., Disp: 15 tablet, Rfl: 0   pregabalin (LYRICA) 25 MG capsule, Take 25 mg by mouth 3 (three) times daily., Disp: , Rfl:    QULIPTA 60 MG TABS, Take 1 tablet by mouth daily., Disp: , Rfl:    tobramycin-dexamethasone  (TOBRADEX) ophthalmic solution, INSTILL 1 DROP INTO AFFECTED EYE(S) BY OPHTHALMIC ROUTE BID x 7 days, Disp: , Rfl:  traMADol  (ULTRAM ) 50 MG tablet, Take 50 mg by mouth 3 (three) times daily as needed., Disp: , Rfl:    Ubrogepant (UBRELVY) 100 MG TABS, Take 100 mg by mouth., Disp: , Rfl:    VENTOLIN  HFA 108 (90 Base) MCG/ACT inhaler, Inhale into the lungs., Disp: , Rfl:    Vitamin D,  Ergocalciferol, (DRISDOL) 1.25 MG (50000 UNIT) CAPS capsule, Take 50,000 Units by mouth 2 (two) times a week. (Patient taking differently: Take 50,000 Units by mouth once a week.), Disp: , Rfl:       Objective:  BP (!) 147/91   Pulse (!) 103   Temp 97.6 F (36.4 C) (Oral)   Ht 5' 7.75 (1.721 m)   Wt 121 lb 3.2 oz (55 kg)   SpO2 98% Comment: room air  BMI 18.56 kg/m  BMI Readings from Last 3 Encounters:  05/04/24 18.56 kg/m  04/08/24 17.68 kg/m  03/06/24 17.53 kg/m    General: not in distress patient appearing cachetic.  Lungs: prolonged exp bs.  Heart: regular rate rhythm, no murmur appreciated.  Abdomen: non tender, non distended. Normal BS.  Neuro: axox3.  Moving all extremities.     Diagnostic Review:  Last CBC Lab Results  Component Value Date   WBC 5.0 03/06/2024   HGB 13.5 03/06/2024   HCT 41.1 03/06/2024   MCV 85.5 03/06/2024   MCH 28.1 05/04/2020   RDW 15.1 03/06/2024   PLT 221.0 03/06/2024       Assessment & Plan:   Assessment & Plan Asthma-COPD overlap syndrome (HCC) PFT today showing mod COPD. Severely reduced DLCO.  Improvement with symbicort . Cont symbicort + prn albuterol .  Pulm rehab request sent.  - alpha1 test ordered.  Orders:   AMB referral to pulmonary rehabilitation   Alpha-1-antitrypsin; Future  Encounter for smoking cessation counseling Working on quitting. On wellbutrin.  Smoking/Tobacco Cessation Counseling Rainna Nearhood is a current user of tobacco or nicotine products. She is ready to quit at this time. Counseling provided today addressed the risks of continued use and the benefits of cessation. Discussed tobacco/nicotine use history, readiness to quit, and evidence-based treatment options including behavioral strategies, support resources, and pharmacologic therapies. Provided encouragement and educational materials on steps and resources to quit smoking. Patient questions were addressed, and follow-up recommended for continued  support. Total time spent on counseling: 3 minutes.      Needs flu shot Will give flu and prevnar 20.      I personally spent a total of 20 minutes in the care of the patient today including preparing to see the patient, getting/reviewing separately obtained history, performing a medically appropriate exam/evaluation, counseling and educating, documenting clinical information in the EHR, and independently interpreting results.   Return in about 3 months (around 08/04/2024).   Starlene Consuegra, MD

## 2024-05-04 NOTE — Progress Notes (Signed)
 Full pft performed today

## 2024-05-04 NOTE — Addendum Note (Signed)
 Addended by: Rashawna Scoles M on: 05/04/2024 02:14 PM   Modules accepted: Orders

## 2024-05-04 NOTE — Patient Instructions (Signed)
 Full pft performed today

## 2024-05-04 NOTE — Addendum Note (Signed)
 Addended by: Ayush Boulet M on: 05/04/2024 02:10 PM   Modules accepted: Orders

## 2024-05-05 LAB — ALPHA-1-ANTITRYPSIN: A-1 Antitrypsin, Ser: 141 mg/dL (ref 83–199)

## 2024-05-06 ENCOUNTER — Telehealth (HOSPITAL_COMMUNITY): Payer: Self-pay

## 2024-05-06 ENCOUNTER — Ambulatory Visit: Payer: Self-pay

## 2024-05-06 ENCOUNTER — Ambulatory Visit (HOSPITAL_COMMUNITY)
Admission: RE | Admit: 2024-05-06 | Discharge: 2024-05-06 | Disposition: A | Discharge status: Home / Self Care | Source: Ambulatory Visit | Attending: Gastroenterology | Admitting: Gastroenterology

## 2024-05-06 ENCOUNTER — Encounter: Payer: Self-pay | Admitting: Gastroenterology

## 2024-05-06 DIAGNOSIS — R634 Abnormal weight loss: Secondary | ICD-10-CM | POA: Insufficient documentation

## 2024-05-06 DIAGNOSIS — R93429 Abnormal radiologic findings on diagnostic imaging of unspecified kidney: Secondary | ICD-10-CM | POA: Diagnosis present

## 2024-05-06 DIAGNOSIS — R112 Nausea with vomiting, unspecified: Secondary | ICD-10-CM | POA: Insufficient documentation

## 2024-05-06 DIAGNOSIS — R109 Unspecified abdominal pain: Secondary | ICD-10-CM | POA: Insufficient documentation

## 2024-05-06 LAB — POCT I-STAT CREATININE: Creatinine, Ser: 0.7 mg/dL (ref 0.44–1.00)

## 2024-05-06 MED ORDER — SODIUM CHLORIDE (PF) 0.9 % IJ SOLN
INTRAMUSCULAR | Status: AC
Start: 2024-05-06 — End: 2024-05-06
  Filled 2024-05-06: qty 50

## 2024-05-06 MED ORDER — IOHEXOL 300 MG/ML  SOLN
100.0000 mL | Freq: Once | INTRAMUSCULAR | Status: AC | PRN
  Administered 2024-05-06: 100 mL via INTRAVENOUS

## 2024-05-06 NOTE — Telephone Encounter (Signed)
 Patient called back to confirm interest in pulmonary rehab.

## 2024-05-06 NOTE — Telephone Encounter (Signed)
 Called patient to see if she was interested in Pulmonary rehab. Left VM to call back if interested

## 2024-05-06 NOTE — Telephone Encounter (Signed)
Attempted to call patient in regards to Pulmonary Rehab - LM on VM   Sent letter 

## 2024-05-11 ENCOUNTER — Telehealth (HOSPITAL_COMMUNITY): Payer: Self-pay

## 2024-05-11 NOTE — Telephone Encounter (Signed)
 Called patient to schedule Pulmonary Rehab, LVCT[facility]

## 2024-05-11 NOTE — Telephone Encounter (Signed)
 Pt insurance is active and benefits verified through [facility]. Co-pay 0, DED/ NA, out of pocket /NA, co-insurance 0%.   pre-authorization not required. No visit limit. Spoke with Adriana G. REF# [id number].  Patient Medicaid is terming 08/08/2024.  Will need to check with Patient to see if she will be using another ins.

## 2024-05-13 ENCOUNTER — Telehealth: Payer: Self-pay

## 2024-05-13 ENCOUNTER — Telehealth (HOSPITAL_COMMUNITY): Payer: Self-pay

## 2024-05-13 ENCOUNTER — Encounter (HOSPITAL_COMMUNITY): Payer: Self-pay

## 2024-05-13 NOTE — Telephone Encounter (Signed)
-----   Message from [facility] sent at 05/12/2024  4:41 PM EST ----- When I saw the patient in November she was doing better with improved COPD symptoms on the inhaler. Then, she was medically optimized for the procedure. If she has worsening symptoms when she is being seen for the EGD please let me know and I can see her in my clinic.   Rahul. ----- Message ----- From: Alwin Corean NOVAK, CMA Sent: 05/12/2024   2:06 PM EST To: Sammi Fredericks, MD  Please advise on clearance. Thank you. ----- Message ----- From: Fredericks Sammi, MD Sent: 04/08/2024   3:09 PM EST To: Corean NOVAK Alwin, CMA  I have an appointment with her in November. I can do a preop eval then. Thanks.   Rahul. ----- Message ----- From: Alwin Corean NOVAK, CMA Sent: 04/08/2024   2:38 PM EDT To: Sammi Fredericks, MD

## 2024-05-13 NOTE — Telephone Encounter (Signed)
Pulm clearance received

## 2024-05-13 NOTE — Telephone Encounter (Signed)
 Patient called back to get scheduled in the Pulmonary Rehab Program. Patient will come in for orientation on 12/08 and will attend the 8:15 exercise class.  Sent MyChart message.

## 2024-05-15 ENCOUNTER — Telehealth (HOSPITAL_COMMUNITY): Payer: Self-pay

## 2024-05-15 ENCOUNTER — Ambulatory Visit: Payer: Self-pay | Admitting: Gastroenterology

## 2024-05-15 NOTE — Telephone Encounter (Signed)
 Called to confirm appt. No answer. Left VM.

## 2024-05-18 ENCOUNTER — Encounter (HOSPITAL_COMMUNITY): Payer: Self-pay

## 2024-05-18 ENCOUNTER — Encounter (HOSPITAL_COMMUNITY)
Admission: RE | Admit: 2024-05-18 | Discharge: 2024-05-18 | Disposition: A | Discharge status: Home / Self Care | Source: Ambulatory Visit

## 2024-05-18 VITALS — BP 128/80 | HR 120 | Ht 68.5 in | Wt 125.7 lb

## 2024-05-18 DIAGNOSIS — J449 Chronic obstructive pulmonary disease, unspecified: Secondary | ICD-10-CM | POA: Insufficient documentation

## 2024-05-18 NOTE — Progress Notes (Addendum)
 Pulmonary Individual Treatment Plan  Patient Details  Name: Anne Olson MRN: [id number] Date of Birth: 1976/04/15 Referring Provider:   Conrad Ports Pulmonary Rehab Walk Test from 05/18/2024 in [facility]  Referring Provider Pawar    Initial Encounter Date:  Flowsheet Row Pulmonary Rehab Walk Test from 05/18/2024 in [facility]  Date 05/18/24    Visit Diagnosis: Stage 3 severe COPD by GOLD classification (HCC)  Patient's Home Medications on Admission:   Current Outpatient Medications:    albuterol  (PROVENTIL ) (2.5 MG/3ML) 0.083% nebulizer solution, SMARTSIG:3 Milliliter(s) 3 Times Daily PRN, Disp: , Rfl:    atropine 1 % ophthalmic solution, Apply to eye., Disp: , Rfl:    brimonidine (ALPHAGAN) 0.2 % ophthalmic solution, , Disp: , Rfl:    budesonide -formoterol  (SYMBICORT ) 160-4.5 MCG/ACT inhaler, Take 2 puffs first thing in am and then another 2 puffs about 12 hours later., Disp: 10.2 each, Rfl: 12   buPROPion (WELLBUTRIN SR) 150 MG 12 hr tablet, Take 150 mg by mouth 2 (two) times daily., Disp: , Rfl:    cyclobenzaprine  (FLEXERIL ) 10 MG tablet, Take 1 tablet (10 mg total) by mouth at bedtime as needed for muscle spasms. Office visit needed for additional refills. 1st notice., Disp: 15 tablet, Rfl: 0   dorzolamide (TRUSOPT) 2 % ophthalmic solution, INSTILL 1 DROP INTO RIGHT EYE TWICE DAILY, Disp: , Rfl:    dorzolamide-timolol (COSOPT) 2-0.5 % ophthalmic solution, SMARTSIG:In Eye(s), Disp: , Rfl:    DULoxetine (CYMBALTA) 20 MG capsule, Take 20 mg by mouth. (Patient taking differently: Take 20 mg by mouth 2 (two) times daily.), Disp: , Rfl:    EPINEPHrine  0.3 mg/0.3 mL IJ SOAJ injection, Inject 0.3 mg into the muscle as needed., Disp: , Rfl:    etonogestrel  (NEXPLANON ) 68 MG IMPL implant, 1 each by Subdermal route once., Disp: , Rfl:    fluticasone (FLONASE) 50 MCG/ACT  nasal spray, Place 2 sprays into both nostrils daily., Disp: , Rfl:    gabapentin (NEURONTIN) 300 MG capsule, Take 300 mg by mouth 3 (three) times daily as needed (Pain). , Disp: , Rfl:    hydrochlorothiazide (HYDRODIURIL) 12.5 MG tablet, Take 12.5 mg by mouth daily., Disp: , Rfl:    ibuprofen  (ADVIL ,MOTRIN ) 800 MG tablet, Take 800 mg by mouth every 8 (eight) hours as needed for mild pain or moderate pain. , Disp: , Rfl:    levocetirizine (XYZAL) 5 MG tablet, Take 5 mg by mouth daily., Disp: , Rfl:    lidocaine  (LIDODERM ) 5 %, Place 1 patch onto the skin every 12 (twelve) hours as needed (Back pain). Remove & Discard patch within 12 hours or as directed by MD , Disp: , Rfl:    metoprolol  succinate (TOPROL -XL) 25 MG 24 hr tablet, Take 25 mg by mouth daily. (Patient taking differently: Take 25 mg by mouth as needed (palpitations).), Disp: , Rfl:    metoprolol  tartrate (LOPRESSOR ) 25 MG tablet, Take 1 tablet once daily as needed for palpitations. (Patient taking differently: Take 25 mg by mouth daily as needed (palpitations.).), Disp: 180 tablet, Rfl: 3   omeprazole (PRILOSEC) 20 MG capsule, Take 20 mg by mouth daily., Disp: , Rfl:    predniSONE  (DELTASONE ) 20 MG tablet, Take 1.5 tablets (30 mg total) by mouth daily with breakfast. (Patient taking differently: Take 30 mg by mouth as needed (flare ups).), Disp: 15 tablet, Rfl: 0   pregabalin (LYRICA) 25 MG  capsule, Take 25 mg by mouth 3 (three) times daily., Disp: , Rfl:    QULIPTA 60 MG TABS, Take 1 tablet by mouth daily., Disp: , Rfl:    tobramycin-dexamethasone  (TOBRADEX) ophthalmic solution, INSTILL 1 DROP INTO AFFECTED EYE(S) BY OPHTHALMIC ROUTE BID x 7 days, Disp: , Rfl:    traMADol  (ULTRAM ) 50 MG tablet, Take 50 mg by mouth 3 (three) times daily as needed., Disp: , Rfl:    Ubrogepant (UBRELVY) 100 MG TABS, Take 100 mg by mouth., Disp: , Rfl:    VENTOLIN  HFA 108 (90 Base) MCG/ACT inhaler, Inhale into the lungs., Disp: , Rfl:    Vitamin D,  Ergocalciferol, (DRISDOL) 1.25 MG (50000 UNIT) CAPS capsule, Take 50,000 Units by mouth 2 (two) times a week. (Patient taking differently: Take 50,000 Units by mouth once a week.), Disp: , Rfl:   Past Medical History: Past Medical History:  Diagnosis Date   Anemia    iron supplements in the past   Anxiety    has been prescribed Xanax   Asthma    has albuterol , advair prn, no attack x 2 years;triggered by strong smells (bleach and smoke)   Benign breast cyst in female 1995   had bx done was normal   DUB (dysfunctional uterine bleeding) 1995   Family hx cleft lip 04/29/2012   FOB's son    Gestational Hypertension 07/17/2012   Headache(784.0)    during and prior to pregnancy;usually Rt side of head;can effect vision   Heart murmur    Hx of preeclampsia, prior pregnancy, currently pregnant 03/12/2012   Induced at 38wks, no meds     Hypertension    Infection    Yeast inf;gets freq w/ antibxs or scented soaps   Infection    BV;recently treated for BV completed Flagyl    Memory loss 2013   Currently having difficult time remembering things   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   Ovarian cyst in pregnancy 2013   seen on recent US    Preeclampsia 1994   induced @ 38 weeks   Scoliosis     Tobacco Use: Social History   Tobacco Use  Smoking Status Every Day   Current packs/day: 0.25   Average packs/day: 0.3 packs/day for 10.0 years (2.5 ttl pk-yrs)   Types: Cigarettes  Smokeless Tobacco Never  Tobacco Comments   Has smoked for 20 years, 05/18/24 down to 2 cigarettes daily    Labs: Review Flowsheet       11/24/2007 04/05/2008  Labs for ITP Cardiac and Pulmonary Rehab  TCO2 23  24     Capillary Blood Glucose: No results found for: GLUCAP   Pulmonary Assessment Scores:  Pulmonary Assessment Scores     Row Name 05/18/24 0929         ADL UCSD   ADL Phase Entry     SOB Score total 54       CAT Score   CAT Score 30       mMRC Score   mMRC  Score 3       UCSD: Self-administered rating of dyspnea associated with activities of daily living (ADLs) 6-point scale (0 = not at all to 5 = maximal or unable to do because of breathlessness)  Scoring Scores range from 0 to 120.  Minimally important difference is 5 units  CAT: CAT can identify the health impairment of COPD patients and is better correlated with disease progression.  CAT has a scoring range  of zero to 40. The CAT score is classified into four groups of low (less than 10), medium (10 - 20), high (21-30) and very high (31-40) based on the impact level of disease on health status. A CAT score over 10 suggests significant symptoms.  A worsening CAT score could be explained by an exacerbation, poor medication adherence, poor inhaler technique, or progression of COPD or comorbid conditions.  CAT MCID is 2 points  mMRC: mMRC (Modified Medical Research Council) Dyspnea Scale is used to assess the degree of baseline functional disability in patients of respiratory disease due to dyspnea. No minimal important difference is established. A decrease in score of 1 point or greater is considered a positive change.   Pulmonary Function Assessment:  Pulmonary Function Assessment - 05/18/24 0929       Breath   Bilateral Breath Sounds Clear    Shortness of Breath Yes;Limiting activity          Exercise Target Goals: Exercise Program Goal: Individual exercise prescription set using results from initial 6 min walk test and THRR while considering  patient's activity barriers and safety.   Exercise Prescription Goal: Initial exercise prescription builds to 30-45 minutes a day of aerobic activity, 2-3 days per week.  Home exercise guidelines will be given to patient during program as part of exercise prescription that the participant will acknowledge.  Activity Barriers & Risk Stratification:  Activity Barriers & Cardiac Risk Stratification - 05/18/24 0926       Activity  Barriers & Cardiac Risk Stratification   Activity Barriers Deconditioning;Muscular Weakness;Shortness of Breath;Back Problems;Other (comment)    Comments b/l foot pain          6 Minute Walk:  6 Minute Walk     Row Name 05/18/24 1018         6 Minute Walk   Phase Initial     Distance 1145 feet     Walk Time 6 minutes     # of Rest Breaks 0     MPH 2.17     METS 4.82     RPE 12     Perceived Dyspnea  1     VO2 Peak 16.88     Symptoms No     Resting HR 113 bpm     Resting BP 128/80     Resting Oxygen Saturation  99 %     Exercise Oxygen Saturation  during 6 min walk 98 %     Max Ex. HR 134 bpm     Max Ex. BP 142/84     2 Minute Post BP 128/80       Interval Oxygen   Interval Oxygen? Yes     Baseline Oxygen Saturation % 99 %     1 Minute Oxygen Saturation % 100 %     1 Minute Liters of Oxygen 0 L     2 Minute Oxygen Saturation % 99 %     2 Minute Liters of Oxygen 0 L     3 Minute Oxygen Saturation % 99 %     3 Minute Liters of Oxygen 0 L     4 Minute Oxygen Saturation % 98 %     4 Minute Liters of Oxygen 0 L     5 Minute Oxygen Saturation % 98 %     5 Minute Liters of Oxygen 0 L     6 Minute Oxygen Saturation % 100 %     6 Minute Liters of Oxygen  0 L     2 Minute Post Oxygen Saturation % 100 %     2 Minute Post Liters of Oxygen 0 L        Oxygen Initial Assessment:  Oxygen Initial Assessment - 05/18/24 0927       Home Oxygen   Home Oxygen Device None    Sleep Oxygen Prescription None    Home Exercise Oxygen Prescription None    Home Resting Oxygen Prescription None      Initial 6 min Walk   Oxygen Used None      Program Oxygen Prescription   Program Oxygen Prescription None      Intervention   Short Term Goals To learn and understand importance of maintaining oxygen saturations>88%;To learn and understand importance of monitoring SPO2 with pulse oximeter and demonstrate accurate use of the pulse oximeter.;To learn and demonstrate proper pursed lip  breathing techniques or other breathing techniques. ;To learn and demonstrate proper use of respiratory medications    Long  Term Goals Maintenance of O2 saturations>88%;Compliance with respiratory medication;Verbalizes importance of monitoring SPO2 with pulse oximeter and return demonstration;Exhibits proper breathing techniques, such as pursed lip breathing or other method taught during program session;Demonstrates proper use of MDI's          Oxygen Re-Evaluation:   Oxygen Discharge (Final Oxygen Re-Evaluation):   Initial Exercise Prescription:  Initial Exercise Prescription - 05/18/24 1000       Date of Initial Exercise RX and Referring Provider   Date 05/18/24    Referring Provider Pawar    Expected Discharge Date 08/21/23      Recumbant Bike   Level 1    RPM 30    Watts 5    Minutes 15    METs 1.5      NuStep   Level 1    SPM 34    Minutes 15    METs 1.4      Prescription Details   Frequency (times per week) 2    Duration Progress to 30 minutes of continuous aerobic without signs/symptoms of physical distress      Intensity   THRR 40-80% of Max Heartrate 69-138    Ratings of Perceived Exertion 11-13    Perceived Dyspnea 0-4      Progression   Progression Continue to progress workloads to maintain intensity without signs/symptoms of physical distress.      Resistance Training   Training Prescription Yes    Weight red bands    Reps 10-15          Perform Capillary Blood Glucose checks as needed.  Exercise Prescription Changes:   Exercise Comments:   Exercise Goals and Review:   Exercise Goals     Row Name 05/18/24 0927             Exercise Goals   Increase Physical Activity Yes       Intervention Provide advice, education, support and counseling about physical activity/exercise needs.;Develop an individualized exercise prescription for aerobic and resistive training based on initial evaluation findings, risk stratification, comorbidities  and participant's personal goals.       Expected Outcomes Short Term: Attend rehab on a regular basis to increase amount of physical activity.;Long Term: Exercising regularly at least 3-5 days a week.;Long Term: Add in home exercise to make exercise part of routine and to increase amount of physical activity.       Increase Strength and Stamina Yes       Intervention Provide advice,  education, support and counseling about physical activity/exercise needs.;Develop an individualized exercise prescription for aerobic and resistive training based on initial evaluation findings, risk stratification, comorbidities and participant's personal goals.       Expected Outcomes Short Term: Increase workloads from initial exercise prescription for resistance, speed, and METs.;Short Term: Perform resistance training exercises routinely during rehab and add in resistance training at home;Long Term: Improve cardiorespiratory fitness, muscular endurance and strength as measured by increased METs and functional capacity ( )       Able to understand and use rate of perceived exertion (RPE) scale Yes       Intervention Provide education and explanation on how to use RPE scale       Expected Outcomes Short Term: Able to use RPE daily in rehab to express subjective intensity level;Long Term:  Able to use RPE to guide intensity level when exercising independently       Able to understand and use Dyspnea scale Yes       Intervention Provide education and explanation on how to use Dyspnea scale       Expected Outcomes Short Term: Able to use Dyspnea scale daily in rehab to express subjective sense of shortness of breath during exertion;Long Term: Able to use Dyspnea scale to guide intensity level when exercising independently       Knowledge and understanding of Target Heart Rate Range (THRR) Yes       Intervention Provide education and explanation of THRR including how the numbers were predicted and where they are located for  reference       Expected Outcomes Short Term: Able to state/look up THRR;Short Term: Able to use daily as guideline for intensity in rehab;Long Term: Able to use THRR to govern intensity when exercising independently       Understanding of Exercise Prescription Yes       Intervention Provide education, explanation, and written materials on patient's individual exercise prescription       Expected Outcomes Short Term: Able to explain program exercise prescription;Long Term: Able to explain home exercise prescription to exercise independently          Exercise Goals Re-Evaluation :   Discharge Exercise Prescription (Final Exercise Prescription Changes):   Nutrition:  Target Goals: Understanding of nutrition guidelines, daily intake of sodium 1500mg , cholesterol 200mg , calories 30% from fat and 7% or less from saturated fats, daily to have 5 or more servings of fruits and vegetables.  Biometrics:  Pre Biometrics - 05/18/24 0915       Pre Biometrics   Grip Strength 10 kg           Nutrition Therapy Plan and Nutrition Goals:   Nutrition Assessments:  MEDIFICTS Score Key: >=70 Need to make dietary changes  40-70 Heart Healthy Diet <= 40 Therapeutic Level Cholesterol Diet   Picture Your Plate Scores: <59 Unhealthy dietary pattern with much room for improvement. 41-50 Dietary pattern unlikely to meet recommendations for good health and room for improvement. 51-60 More healthful dietary pattern, with some room for improvement.  >60 Healthy dietary pattern, although there may be some specific behaviors that could be improved.    Nutrition Goals Re-Evaluation:   Nutrition Goals Discharge (Final Nutrition Goals Re-Evaluation):   Psychosocial: Target Goals: Acknowledge presence or absence of significant depression and/or stress, maximize coping skills, provide positive support system. Participant is able to verbalize types and ability to use techniques and skills needed for  reducing stress and depression.  Initial Review & Psychosocial Screening:  Initial Psych Review & Screening - 05/18/24 0933       Initial Review   Current issues with Current Anxiety/Panic;Current Psychotropic Meds;Current Sleep Concerns      Family Dynamics   Good Support System? Yes    Comments Pt stated she has good support at home from her wife, daughter and niece. She states she has sleep disturbances and states she can't fall asleep or stay asleep due to leg cramps. Golda states that she has recently been started on Lyrica and Cymbalta for chronic pain and neuropathy. She states she has been doing family therapy after the passing of her dad, who she was his primary caregiver. Eppie states her anxiety has been stable, but she still gets heart palpitations and takes meds as needed. She denies any needs, resources or referrals at this time.      Barriers   Psychosocial barriers to participate in program Psychosocial barriers identified (see note)      Screening Interventions   Interventions Encouraged to exercise;Provide feedback about the scores to participant    Expected Outcomes Short Term goal: Utilizing psychosocial counselor, staff and physician to assist with identification of specific Stressors or current issues interfering with healing process. Setting desired goal for each stressor or current issue identified.;Long Term Goal: Stressors or current issues are controlled or eliminated.;Short Term goal: Identification and review with participant of any Quality of Life or Depression concerns found by scoring the questionnaire.;Long Term goal: The participant improves quality of Life and PHQ9 Scores as seen by post scores and/or verbalization of changes          Quality of Life Scores:  Scores of 19 and below usually indicate a poorer quality of life in these areas.  A difference of  2-3 points is a clinically meaningful difference.  A difference of 2-3 points in the total score  of the Quality of Life Index has been associated with significant improvement in overall quality of life, self-image, physical symptoms, and general health in studies assessing change in quality of life.  PHQ-9: Review Flowsheet       05/18/2024 09/08/2016 06/07/2016  Depression screen PHQ 2/9  Decreased Interest 0 0 0  Down, Depressed, Hopeless 0 0 0  PHQ - 2 Score 0 0 0  Altered sleeping 3 - -  Tired, decreased energy 0 - -  Change in appetite 0 - -  Feeling bad or failure about yourself  0 - -  Trouble concentrating 0 - -  Moving slowly or fidgety/restless 0 - -  Suicidal thoughts 0 - -  PHQ-9 Score 3 - -  Difficult doing work/chores Not difficult at all - -   Interpretation of Total Score  Total Score Depression Severity:  1-4 = Minimal depression, 5-9 = Mild depression, 10-14 = Moderate depression, 15-19 = Moderately severe depression, 20-27 = Severe depression   Psychosocial Evaluation and Intervention:  Psychosocial Evaluation - 05/18/24 1052       Psychosocial Evaluation & Interventions   Interventions Encouraged to exercise with the program and follow exercise prescription;Stress management education;Relaxation education    Comments Pt stated she has good support at home from her wife, daughter and niece. She states she has sleep disturbances and states she can't fall asleep or stay asleep due to leg cramps. Ashlley states that she has recently been started on Lyrica and Cymbalta for chronic pain and neuropathy. She states she has been doing family therapy after the passing of her dad, who she was his  primary caregiver. Bradie states her anxiety has been stable, but she still gets heart palpitations and takes meds as needed. She denies any needs, resources or referrals at this time.    Expected Outcomes For Estrellita to reduce stress, get better sleep and participate in PR without any other barriers or concerns    Continue Psychosocial Services  Follow up required by staff           Psychosocial Re-Evaluation:   Psychosocial Discharge (Final Psychosocial Re-Evaluation):   Education: Education Goals: Education classes will be provided on a weekly basis, covering required topics. Participant will state understanding/return demonstration of topics presented.  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 Instructor discusses importance of knowing and understanding resting, exercise, and post-exercise oxygen saturation, heart rate, and blood pressure. Oxygen saturation, heart rate, blood pressure, rating of perceived exertion, and dyspnea are reviewed along with a normal range for these values.    Exercise for the Pulmonary Patient Group instruction that is supported by a PowerPoint presentation. Instructor discusses benefits of exercise, core components of exercise, frequency, duration, and intensity of an exercise routine, importance of utilizing pulse oximetry during exercise, safety while exercising, and options of places to exercise outside of rehab.    MET Level  Group instruction provided by PowerPoint, verbal discussion, and written material to support subject matter. Instructor reviews what METs are and how to increase METs.    Pulmonary Medications Verbally interactive group education provided by instructor with focus on inhaled medications and proper administration.   Anatomy and Physiology of the Respiratory System Group instruction provided by PowerPoint, verbal discussion, and written material to support subject matter. Instructor reviews respiratory cycle and anatomical components of the respiratory system and their functions. Instructor also reviews differences in obstructive and  restrictive respiratory diseases with examples of each.    Oxygen Safety Group instruction provided by PowerPoint, verbal discussion, and written material to support subject matter. There is an overview of "What is Oxygen" and "Why do we need it".  Instructor also reviews how to create a safe environment for oxygen use, the importance of using oxygen as prescribed, and the risks of noncompliance. There is a brief discussion on traveling with oxygen and resources the patient may utilize.   Oxygen Use Group instruction provided by PowerPoint, verbal discussion, and written material to discuss how supplemental oxygen is prescribed and different types of oxygen supply systems. Resources for more information are provided.    Breathing Techniques Group instruction that is supported by demonstration and informational handouts. Instructor discusses the benefits of pursed lip and diaphragmatic breathing and detailed demonstration on how to perform both.     Risk Factor Reduction Group instruction that is supported by a PowerPoint presentation. Instructor discusses the definition of a risk factor, different risk factors for pulmonary disease, and how the heart and lungs work together.   Pulmonary Diseases Group instruction provided by PowerPoint, verbal discussion, and written material to support subject matter. Instructor gives an overview of the different type of pulmonary diseases. There is also a discussion on risk factors and symptoms as well as ways to manage the diseases.   Stress and Energy Conservation Group instruction provided by PowerPoint, verbal discussion, and written  material to support subject matter. Instructor gives an overview of stress and the impact it can have on the body. Instructor also reviews ways to reduce stress. There is also a discussion on energy conservation and ways to conserve energy throughout the day.   Warning Signs and Symptoms Group instruction provided by  PowerPoint, verbal discussion, and written material to support subject matter. Instructor reviews warning signs and symptoms of stroke, heart attack, cold and flu. Instructor also reviews ways to prevent the spread of infection.   Other Education Group or individual verbal, written, or video instructions that support the educational goals of the pulmonary rehab program.    Knowledge Questionnaire Score:  Knowledge Questionnaire Score - 05/18/24 1054       Knowledge Questionnaire Score   Pre Score 17/18          Core Components/Risk Factors/Patient Goals at Admission:  Personal Goals and Risk Factors at Admission - 05/18/24 0943       Core Components/Risk Factors/Patient Goals on Admission    Weight Management Weight Gain;Yes    Intervention Weight Management: Provide education and appropriate resources to help participant work on and attain dietary goals.;Weight Management: Develop a combined nutrition and exercise program designed to reach desired caloric intake, while maintaining appropriate intake of nutrient and fiber, sodium and fats, and appropriate energy expenditure required for the weight goal.    Expected Outcomes Short Term: Continue to assess and modify interventions until short term weight is achieved;Long Term: Adherence to nutrition and physical activity/exercise program aimed toward attainment of established weight goal;Weight Maintenance: Understanding of the daily nutrition guidelines, which includes 25-35% calories from fat, 7% or less cal from saturated fats, less than 200mg  cholesterol, less than 1.5gm of sodium, & 5 or more servings of fruits and vegetables daily;Understanding recommendations for meals to include 15-35% energy as protein, 25-35% energy from fat, 35-60% energy from carbohydrates, less than 200mg  of dietary cholesterol, 20-35 gm of total fiber daily;Understanding of distribution of calorie intake throughout the day with the consumption of 4-5  meals/snacks;Weight Gain: Understanding of general recommendations for a high calorie, high protein meal plan that promotes weight gain by distributing calorie intake throughout the day with the consumption for 4-5 meals, snacks, and/or supplements    Improve shortness of breath with ADL's Yes    Intervention Provide education, individualized exercise plan and daily activity instruction to help decrease symptoms of SOB with activities of daily living.    Expected Outcomes Short Term: Improve cardiorespiratory fitness to achieve a reduction of symptoms when performing ADLs;Long Term: Be able to perform more ADLs without symptoms or delay the onset of symptoms          Core Components/Risk Factors/Patient Goals Review:    Core Components/Risk Factors/Patient Goals at Discharge (Final Review):    ITP Comments:   Comments: Dr. Slater Staff is Medical Director for Pulmonary Rehab at [facility].

## 2024-05-18 NOTE — Progress Notes (Signed)
 Anne Olson Shoulder 48 y.o. female Pulmonary Rehab Orientation Note This patient who was referred to Pulmonary Rehab by Dr. Theodoro with the diagnosis of COPD 3 arrived today in Cardiac and Pulmonary Rehab. She  arrived ambulatory with normal gait. She  does not carry portable oxygen. Per patient, Soul uses oxygen never. Color good, skin warm and dry. Patient is oriented to time and place. Patient's medical history, psychosocial health, and medications reviewed. Psychosocial assessment reveals patient lives with family. 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 Patient hobbies include watching tv and scrolling on social media. Patient reports her stress level is low. Areas of stress/anxiety include health. Patient does not exhibit signs of depression. Signs of depression include helplessness and fatigue. PHQ2/9 score 0/3. Brea shows good  coping skills with positive outlook on life. Offered emotional support and reassurance. Will continue to monitor and evaluate progress toward psychosocial goal(s) of decreased health related stress and anxiety. Physical assessment reveals heart rate is normal, breath sounds clear to auscultation, no wheezes, rales, or rhonchi. Grip strength equal, strong. Distal pulses present. [name] reports she does take medications as prescribed. Patient states she follows a regular  diet. The patient reports no specific efforts to gain or lose weight. Pt's weight will be monitored closely. Demonstration and practice of PLB using pulse oximeter. Genecis able to return demonstration satisfactorily. Safety and hand hygiene in the exercise area reviewed with patient. [name] voices understanding of the information reviewed. Department expectations discussed with patient and achievable goals were set. The patient shows enthusiasm about attending the program and we look forward to working with Anne Olson. Skylor completed a 6 min walk test today and is scheduled to begin exercise  on 12/16 at 0815.   [id number] [name]

## 2024-05-18 NOTE — Progress Notes (Signed)
 Anne Olson 48 y.o. female  Initial Psychosocial Assessment  Pt psychosocial assessment reveals pt lives with their family. 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 Pt hobbies include watching tv and social media. Pt reports her stress level is low. Areas of stress/anxiety include health.  Pt does not exhibit signs of depression. Signs of depression include hopelessness and fatigue. Pt shows good  coping skills with positive outlook . Offered emotional support and reassurance. Monitor and evaluate progress toward psychosocial goal(s).  Goal(s): Improved management of stress and anxiety Improved coping skills Help patient work toward returning to meaningful activities that improve patient's QOL and are attainable with patient's lung disease   05/18/2024 10:55 AM

## 2024-05-19 ENCOUNTER — Telehealth: Payer: Self-pay

## 2024-05-19 ENCOUNTER — Ambulatory Visit: Payer: Self-pay

## 2024-05-19 NOTE — Telephone Encounter (Signed)
 Copied from CRM #[id number]. Topic: Clinical - Red Word Triage >> May 19, 2024 10:33 AM Corean SAUNDERS wrote: Red Word that prompted transfer to Nurse Triage: Shortness of breath, pressure in chest increasing and can't sleep at night due to cough.  Seeking breakthrough inhaler >> May 19, 2024  3:57 PM Corean SAUNDERS wrote: Patient requesting a return call from [facility] as E2C2 attempted to schedule patient with Dr. Theodoro tomorrow per his request but there is no availability. Patient is requesting to be seen early in the morning as she has work at 11 am

## 2024-05-19 NOTE — Telephone Encounter (Signed)
Dr. Melissa Montane,  Please advise.  Thank you

## 2024-05-19 NOTE — Telephone Encounter (Signed)
 ATC x1.  Left detailed message for patient to return call to schedule her for an office visit per Dr. Theodoro.  When she returns call, please schedule her for an OV.  Thank you.

## 2024-05-19 NOTE — Telephone Encounter (Signed)
 FYI Only or Action Required?: Action required by provider: request for appointment and clinical question for provider.  Patient is followed in Pulmonology for copd, last seen on 05/04/2024 by Theodoro Lakes, MD.  Called Nurse Triage reporting Cough and Shortness of Breath.  Symptoms began a week ago.  Interventions attempted: Rescue inhaler and Maintenance inhaler.  Symptoms are: unchanged.  Triage Disposition: See HCP Within 4 Hours (Or PCP Triage)  Patient/caregiver understands and will follow disposition?: No, wishes to speak with PCP   Copied from CRM #[id number]. Topic: Clinical - Red Word Triage >> May 19, 2024 10:33 AM Corean SAUNDERS wrote: Red Word that prompted transfer to Nurse Triage: Shortness of breath, pressure in chest increasing and can't sleep at night due to cough.  Seeking breakthrough inhaler Reason for Disposition  [1] Longstanding difficulty breathing AND [2] not responding to usual therapy  Continuous (nonstop) coughing interferes with work, school, or sleeping  Answer Assessment - Initial Assessment Questions Pt recently seen by dr. Pawar. He gave her a prn script for steroids but she doesn't feel like she is about to have an asthma attack, she just feels like the ventolin  is not helping like it should. She denies being in any distress just feels that she is not getting adequate relief. She states at times she was using the inhaler 4-6 or more times a day. She states with the increase she did have dizziness with bending over so she has since stopped using it that much. She is having a cough at night for the last couple of weeks. She is doing everything she should be, head elevated, prilosec, maintenance meds but is starting to affect her sleep. She said multiple times she does not think she is on the verge of an asthma attack would just like more relief. At night with the cough she does also have some chest tightness but denies any radiation or other symptoms.   RN did try  to offer pt appt today but she goes to work at 27 and states the earliest she could come in for an appt would be tomorrow. She did say she knows if anything gets worse to go to the ER.    1. RESPIRATORY STATUS: Describe your breathing? (e.g., wheezing, shortness of breath, unable to speak, severe coughing)      Shortness of breath, cough at night 2. ONSET: When did this breathing problem begin?      Last couple of weeks 3. PATTERN Does the difficult breathing come and go, or has it been constant since it started?      Intermittent but cough is nightly 4. SEVERITY: How bad is your breathing? (e.g., mild, moderate, severe)      mild 5. RECURRENT SYMPTOM: Have you had difficulty breathing before? If Yes, ask: When was the last time? and What happened that time?      Yes, does not feel on verge of asthma attack, ventolin  is just not effective currently 6. CARDIAC HISTORY: Do you have any history of heart disease? (e.g., heart attack, angina, bypass surgery, angioplasty)      no 7. LUNG HISTORY: Do you have any history of lung disease?  (e.g., pulmonary embolus, asthma, emphysema)     Asthma, copd 8. CAUSE: What do you think is causing the breathing problem?      Unknown why this cough 9. OTHER SYMPTOMS: Do you have any other symptoms? (e.g., chest pain, cough, dizziness, fever, runny nose)     Cough, feels like stuff is  in her throat and she needs to cough, all clear phlegm  Protocols used: Breathing Difficulty-A-AH

## 2024-05-20 ENCOUNTER — Encounter: Payer: Self-pay | Admitting: Pulmonary Disease

## 2024-05-20 ENCOUNTER — Ambulatory Visit: Admitting: Pulmonary Disease

## 2024-05-20 VITALS — BP 132/83 | HR 110 | Temp 97.3°F | Ht 68.5 in | Wt 126.0 lb

## 2024-05-20 DIAGNOSIS — J441 Chronic obstructive pulmonary disease with (acute) exacerbation: Secondary | ICD-10-CM | POA: Diagnosis not present

## 2024-05-20 MED ORDER — PREDNISONE 20 MG PO TABS: ORAL_TABLET | ORAL | 0 refills | Status: AC

## 2024-05-20 MED ORDER — AZITHROMYCIN 250 MG PO TABS: ORAL_TABLET | ORAL | 0 refills | Status: AC

## 2024-05-20 MED ORDER — SPIRIVA RESPIMAT 2.5 MCG/ACT IN AERS: 2.0000 | INHALATION_SPRAY | Freq: Every day | RESPIRATORY_TRACT | 12 refills | Status: AC

## 2024-05-20 NOTE — Patient Instructions (Addendum)
 It is nice to see you  I am worried about exacerbation of underlying COPD  To aggressively treat this take prednisone  and azithromycin  as prescribed, steroids and antibiotics to help clear up inflammation and help you breathe easier and improve the cough etc.  Continue Symbicort  2 puffs twice a day as you are.  I added Spiriva 2 puffs once a day.  This puts you on the maximal inhaler therapy to be aggressive to help your breathing moving forward.  If not improving the next 48 to 72 hours or if any worsening, I recommend you go to the ED for evaluation   Return to clinic with Dr. Theodoro in 1 to 2 months or sooner as needed

## 2024-05-20 NOTE — Progress Notes (Signed)
 @Patient  ID: Anne Olson, female    DOB: 1976-02-16, 48 y.o.   MRN: [id number]  Chief Complaint  Patient presents with   Asthma    Increased SOB with exertion, chest pressure and prod cough with clear sputum.     Referring provider: Lenon Nell SAILOR, FNP  HPI:   48 y.o. woman with history of COPD and asthma whom are seen for evaluation of worsening cough and shortness of breath.  Multiple prior pulmonary notes reviewed.  Worsening dyspnea on exertion for a couple of weeks.  Gradually worsening cough as well.  Over last 2 to 3 days cough and dyspnea even worse, acutely so.  No fevers.  No sick contacts.  No recent travel surgeries etc.  She has been using her rescue inhaler with minimal effectiveness.  Her rescue nebulizer seems provide some improvement.  She reports good effects of Symbicort .  Has not taken any prednisone  she had on hand, prescribed 02/2024 on review of records.  Questionaires / Pulmonary Flowsheets:   ACT:  Asthma Control Test ACT Total Score  03/06/2024  9:35 AM 10    MMRC:     No data to display          Epworth:      No data to display          Tests:   FENO:  No results found for: NITRICOXIDE  PFT:    Latest Ref Rng & Units 05/04/2024   10:42 AM  PFT Results  FVC-Pre L 2.80   FVC-Predicted Pre % 69   FVC-Post L 2.43   FVC-Predicted Post % 60   Pre FEV1/FVC % % 64   Post FEV1/FCV % % 63   FEV1-Pre L 1.78   FEV1-Predicted Pre % 55   FEV1-Post L 1.52   DLCO uncorrected ml/min/mmHg 12.29   DLCO UNC% % 51   DLVA Predicted % 78   TLC L 5.41   TLC % Predicted % 96   RV % Predicted % 147   Personally viewed interpret as moderate fixed obstruction, spirometry suggestive of air trapping versus restriction, lung volumes consistent with restriction, DLCO is moderately reduced  WALK:     05/18/2024   10:18 AM 05/16/2012   12:34 PM  SIX MIN WALK  Supplimental Oxygen during Test? (L/min)  No  2 Minute Oxygen Saturation % 99 %   4  Minute Oxygen Saturation % 98 %   6 Minute Oxygen Saturation % 100 %   Tech Comments:  couldn't complete all 3 laps due to fatigue    Imaging: Personally reviewed as per EMR and discussion this note CT RENAL ABDOMEN W WO CONTRAST Result Date: 05/15/2024 CLINICAL DATA:  Indeterminate left kidney upper pole abnormality by ultrasound, concern for hypoechoic mass versus summation artifact. EXAM: CT ABDOMEN WITHOUT AND WITH CONTRAST TECHNIQUE: Multidetector CT imaging of the abdomen was performed following the standard protocol before and following the bolus administration of intravenous contrast. RADIATION DOSE REDUCTION: This exam was performed according to the departmental dose-optimization program which includes automated exposure control, adjustment of the mA and/or kV according to patient size and/or use of iterative reconstruction technique. CONTRAST:  OMNIPAQUE  IOHEXOL  300 MG/ML  SOLN COMPARISON:  04/15/2024 FINDINGS: Lower chest: Minor scattered bibasilar and lingula atelectasis. Normal heart size. No pericardial or pleural effusion. Hepatobiliary: No focal liver abnormality is seen. No gallstones, gallbladder wall thickening, or biliary dilatation. Pancreas: Unremarkable. No pancreatic ductal dilatation or surrounding inflammatory changes. Spleen: Normal in size without  focal abnormality. Two adjacent small accessory splenules noted. Adrenals/Urinary Tract: No adrenal abnormality. No left kidney upper pole abnormality by CT. Left kidney midpole hypodense cortical cyst measures 10 mm, image 40/11. No further imaging recommended. No other renal parenchymal abnormality. Tiny punctate nonobstructing 3 mm calculus within the left kidney lower pole, image 53/11. Ureters are symmetric without hydroureter or obstruction. Stomach/Bowel: Stomach is within normal limits. No evidence of bowel wall thickening, distention, or inflammatory changes. Vascular/Lymphatic: No significant vascular findings are  present. No enlarged abdominal or pelvic lymph nodes. Other: No abdominal wall hernia or abnormality. Musculoskeletal: No acute or significant osseous findings. IMPRESSION: 1. No left kidney upper pole abnormality by CT. 2. 10 mm left kidney midpole hypodense cortical cyst. No further imaging recommended. 3. 3 mm nonobstructing left kidney lower pole calculus. 4. No other acute intra-abdominal finding. Electronically Signed   By: CHRISTELLA.  Shick M.D.   On: 05/15/2024 11:30    Lab Results: Personally reviewed, notably no elevation in eosinophils or IgE CBC    Component Value Date/Time   WBC 5.0 03/06/2024 1028   RBC 4.81 03/06/2024 1028   HGB 13.5 03/06/2024 1028   HCT 41.1 03/06/2024 1028   PLT 221.0 03/06/2024 1028   MCV 85.5 03/06/2024 1028   MCV 90.3 09/08/2016 1604   MCH 28.1 05/04/2020 0653   MCHC 32.8 03/06/2024 1028   RDW 15.1 03/06/2024 1028   LYMPHSABS 1.7 03/06/2024 1028   MONOABS 0.5 03/06/2024 1028   EOSABS 0.0 03/06/2024 1028   BASOSABS 0.0 03/06/2024 1028    BMET    Component Value Date/Time   NA 136 05/04/2020 0653   NA 141 09/08/2016 1539   K 3.7 05/04/2020 0653   CL 106 05/04/2020 0653   CO2 23 05/04/2020 0653   GLUCOSE 94 05/04/2020 0653   BUN 6 05/04/2020 0653   BUN 6 09/08/2016 1539   CREATININE 0.70 05/06/2024 0957   CREATININE 0.43 (L) 08/07/2012 1145   CALCIUM 8.8 (L) 05/04/2020 0653   GFRNONAA >60 05/04/2020 0653   GFRAA 113 09/08/2016 1539    BNP No results found for: BNP  ProBNP No results found for: PROBNP  Specialty Problems       Pulmonary Problems   Asthma   Followed in Pulmonary clinic/ [facility]/ [facility]  - hfa 90% 05/30/2012       Dyspnea   Followed in Pulmonary clinic/ [facility]/ [facility] - 05/16/2012   Walked RA x one lap @ 185 stopped due to  Fatigue with 02 sats still 100%      Acute respiratory failure with hypoxia (HCC)   CAP (community acquired pneumonia)   Pneumonia    Allergies  Allergen Reactions    Bee Venom Anaphylaxis and Swelling    Throat swells  honey bee venom   Other Dermatitis, Anaphylaxis, Hives and Other (See Comments)    Solanum (organism)   Tomato Anaphylaxis and Hives    & throat swelling    Immunization History  Administered Date(s) Administered   Influenza Split 04/30/2012   Influenza, Seasonal, Injecte, Preservative Fre 04/26/2023, 05/04/2024   Influenza,inj,Quad PF,6+ Mos 05/21/2015   PNEUMOCOCCAL CONJUGATE-20 05/04/2024   Pfizer(Comirnaty)Fall Seasonal Vaccine 12 years and older 04/26/2023   Pneumococcal Polysaccharide-23 08/14/2012   Tdap 07/31/2012, 12/21/2021    Past Medical History:  Diagnosis Date   Anemia    iron supplements in the past   Anxiety    has been prescribed Xanax   Asthma    has albuterol , advair prn,  no attack x 2 years;triggered by strong smells (bleach and smoke)   Benign breast cyst in female 1995   had bx done was normal   DUB (dysfunctional uterine bleeding) 1995   Family hx cleft lip 04/29/2012   FOB's son    Gestational Hypertension 07/17/2012   Headache(784.0)    during and prior to pregnancy;usually Rt side of head;can effect vision   Heart murmur    Hx of preeclampsia, prior pregnancy, currently pregnant 03/12/2012   Induced at 38wks, no meds     Hypertension    Infection    Yeast inf;gets freq w/ antibxs or scented soaps   Infection    BV;recently treated for BV completed Flagyl    Memory loss 2013   Currently having difficult time remembering things   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   Ovarian cyst in pregnancy 2013   seen on recent US    Preeclampsia 1994   induced @ 38 weeks   Scoliosis     Tobacco History: Social History   Tobacco Use  Smoking Status Every Day   Current packs/day: 0.25   Average packs/day: 0.3 packs/day for 10.0 years (2.5 ttl pk-yrs)   Types: Cigarettes  Smokeless Tobacco Never  Tobacco Comments   Has smoked for 20 years, 05/18/24 down to 2 cigarettes daily    Ready to quit: Not Answered Counseling given: Not Answered Tobacco comments: Has smoked for 20 years, 05/18/24 down to 2 cigarettes daily   Continue to not smoke  Outpatient Encounter Medications as of 05/20/2024  Medication Sig   albuterol  (PROVENTIL ) (2.5 MG/3ML) 0.083% nebulizer solution SMARTSIG:3 Milliliter(s) 3 Times Daily PRN   atropine 1 % ophthalmic solution Apply to eye.   azithromycin  (ZITHROMAX ) 250 MG tablet Take 2 tablets (500 mg total) by mouth daily for 1 day, THEN 1 tablet (250 mg total) daily for 4 days.   brimonidine (ALPHAGAN) 0.2 % ophthalmic solution    budesonide -formoterol  (SYMBICORT ) 160-4.5 MCG/ACT inhaler Take 2 puffs first thing in am and then another 2 puffs about 12 hours later.   buPROPion (WELLBUTRIN SR) 150 MG 12 hr tablet Take 150 mg by mouth 2 (two) times daily.   cyclobenzaprine  (FLEXERIL ) 10 MG tablet Take 1 tablet (10 mg total) by mouth at bedtime as needed for muscle spasms. Office visit needed for additional refills. 1st notice.   dorzolamide (TRUSOPT) 2 % ophthalmic solution INSTILL 1 DROP INTO RIGHT EYE TWICE DAILY   dorzolamide-timolol (COSOPT) 2-0.5 % ophthalmic solution SMARTSIG:In Eye(s)   DULoxetine (CYMBALTA) 20 MG capsule Take 20 mg by mouth.   EPINEPHrine  0.3 mg/0.3 mL IJ SOAJ injection Inject 0.3 mg into the muscle as needed.   etonogestrel  (NEXPLANON ) 68 MG IMPL implant 1 each by Subdermal route once.   fluticasone (FLONASE) 50 MCG/ACT nasal spray Place 2 sprays into both nostrils daily.   gabapentin (NEURONTIN) 300 MG capsule Take 300 mg by mouth 3 (three) times daily as needed (Pain).    hydrochlorothiazide (HYDRODIURIL) 12.5 MG tablet Take 12.5 mg by mouth daily.   ibuprofen  (ADVIL ,MOTRIN ) 800 MG tablet Take 800 mg by mouth every 8 (eight) hours as needed for mild pain or moderate pain.    levocetirizine (XYZAL) 5 MG tablet Take 5 mg by mouth daily.   lidocaine  (LIDODERM ) 5 % Place 1 patch onto the skin every 12 (twelve) hours as  needed (Back pain). Remove & Discard patch within 12 hours or as directed by MD    metoprolol   succinate (TOPROL -XL) 25 MG 24 hr tablet Take 25 mg by mouth daily.   metoprolol  tartrate (LOPRESSOR ) 25 MG tablet Take 1 tablet once daily as needed for palpitations.   omeprazole (PRILOSEC) 20 MG capsule Take 20 mg by mouth daily.   predniSONE  (DELTASONE ) 20 MG tablet Take 2 tablets (40 mg total) by mouth daily with breakfast for 5 days, THEN 1 tablet (20 mg total) daily with breakfast for 5 days.   pregabalin (LYRICA) 25 MG capsule Take 25 mg by mouth 3 (three) times daily.   QULIPTA 60 MG TABS Take 1 tablet by mouth daily.   Tiotropium Bromide (SPIRIVA RESPIMAT) 2.5 MCG/ACT AERS Inhale 2 puffs into the lungs daily.   tobramycin-dexamethasone  (TOBRADEX) ophthalmic solution INSTILL 1 DROP INTO AFFECTED EYE(S) BY OPHTHALMIC ROUTE BID x 7 days   traMADol  (ULTRAM ) 50 MG tablet Take 50 mg by mouth 3 (three) times daily as needed.   Ubrogepant (UBRELVY) 100 MG TABS Take 100 mg by mouth.   VENTOLIN  HFA 108 (90 Base) MCG/ACT inhaler Inhale into the lungs.   Vitamin D, Ergocalciferol, (DRISDOL) 1.25 MG (50000 UNIT) CAPS capsule Take 50,000 Units by mouth 2 (two) times a week.   predniSONE  (DELTASONE ) 20 MG tablet Take 1.5 tablets (30 mg total) by mouth daily with breakfast. (Patient not taking: Reported on 05/20/2024)   No facility-administered encounter medications on file as of 05/20/2024.     Review of Systems  Review of Systems  No chest pain exertion.  Comprehensive review of systems otherwise negative. Physical Exam  BP 132/83 (Cuff Size: Normal)   Pulse (!) 110   Temp (!) 97.3 F (36.3 C) (Oral)   Ht 5' 8.5 (1.74 m)   Wt 126 lb (57.2 kg)   SpO2 97%   BMI 18.88 kg/m   Wt Readings from Last 5 Encounters:  05/20/24 126 lb (57.2 kg)  05/18/24 125 lb 10.6 oz (57 kg)  05/04/24 121 lb 3.2 oz (55 kg)  04/08/24 118 lb (53.5 kg)  03/06/24 117 lb (53.1 kg)    BMI Readings from Last 5  Encounters:  05/20/24 18.88 kg/m  05/18/24 18.83 kg/m  05/04/24 18.56 kg/m  04/08/24 17.68 kg/m  03/06/24 17.53 kg/m     Physical Exam General: Thin, sitting up Eyes: EOMI  Pulmonary: Poor air excursion, clear, normal work of breathing, on room air Cardiovascular: Tachycardic, regular rate and rhythm, no hypoxemia Abdomen: Nondistended  Assessment & Plan:   Worsening cough, dyspnea on exertion concerning for COPD exacerbation: 2 weeks of gradual worsening, 3 days.  Chest tightness discomfort when lying down and in the evenings.  Not with exertion.  Poor air excursion on exam.  Otherwise clear.  Continue Symbicort  2 puffs twice a day.  Add Spiriva for triple inhaled therapy given exacerbation.  Prednisone  taper, course of azithromycin  for underlying exacerbation.  Not improving in the next 48 to 72 hours or if any clinical worsening in interim recommend ED evaluation.  She is not hypoxemic but pulmonary embolism could contribute to COPD exacerbation especially if not improving with typical therapies.  Emphysema/COPD: Hyperinflated chest imaging.  PFTs consistent with COPD 04/2024.  Escalate to triple inhaled therapy as discussed above.  Eos and IgE okay.  No role for Biologics.  If frequent exacerbations consider addition of thrice weekly azithromycin .  Consider O2 bear with worsening dyspnea on exertion at future date.   Return in about 4 weeks (around 06/17/2024) for f/u Dr. Pawar.   Donnice JONELLE Beals, MD 05/20/2024

## 2024-05-20 NOTE — Telephone Encounter (Signed)
 Patient was seen today by Dr. Annella.  Nothing further needed.

## 2024-05-20 NOTE — Telephone Encounter (Signed)
 Patient was seen by Dr. Annella today.  Nothing further needed.

## 2024-05-26 ENCOUNTER — Encounter (HOSPITAL_COMMUNITY): Admission: RE | Admit: 2024-05-26 | Discharge: 2024-05-26 | Disposition: A | Source: Ambulatory Visit

## 2024-05-26 DIAGNOSIS — J449 Chronic obstructive pulmonary disease, unspecified: Secondary | ICD-10-CM

## 2024-05-26 NOTE — Progress Notes (Signed)
 Daily Session Note  Patient Details  Name: Anne Olson MRN: [id number] Date of Birth: [birth year] Referring Provider:   Conrad Ports [facility] Walk Test from 05/18/2024 in [facility]  Referring Provider Pawar    Encounter Date: 05/26/2024  Check In:  Session Check In - 05/26/24 0834       Check-In   Supervising physician immediately available to respond to emergencies [facility] MD immediately available    Physician(s) Orren Fabry, NP    Location [facility]    Staff Present Ronal Levin, RN, Maud Moats, MS, ACSM-CEP, Exercise Physiologist;Raley Novicki Claudene, RT    Virtual Visit No    Medication changes reported     No    Fall or balance concerns reported    No    Tobacco Cessation No Change    Warm-up and Cool-down Performed as group-led instruction   only   Resistance Training Performed Yes    VAD Patient? No    PAD/SET Patient? No      Pain Assessment   Currently in Pain? No/denies    Multiple Pain Sites No          Capillary Blood Glucose: No results found for this or any previous visit (from the past 24 hours).    Tobacco Use History[1]  Goals Met:  Proper associated with RPD/PD & O2 Sat Independence with exercise equipment Exercise tolerated well No report of concerns or symptoms today Strength training completed today  Goals Unmet:  Not Applicable  Comments: Service time is from 0811 to 0930.    Dr. Slater Staff is Medical Director for Pulmonary Rehab at [facility].     [1]  Social History Tobacco Use  Smoking Status Every Day   Current packs/day: 0.25   Average packs/day: 0.3 packs/day for 10.0 years (2.5 ttl pk-yrs)   Types: Cigarettes  Smokeless Tobacco Never  Tobacco Comments   Has smoked for 20 years, 05/18/24 down to 2 cigarettes daily

## 2024-05-27 ENCOUNTER — Ambulatory Visit: Admitting: Gastroenterology

## 2024-05-27 ENCOUNTER — Encounter: Payer: Self-pay | Admitting: Gastroenterology

## 2024-05-27 VITALS — BP 162/97 | HR 80 | Temp 97.2°F | Resp 13 | Ht 68.0 in | Wt 118.0 lb

## 2024-05-27 DIAGNOSIS — R634 Abnormal weight loss: Secondary | ICD-10-CM

## 2024-05-27 DIAGNOSIS — K3189 Other diseases of stomach and duodenum: Secondary | ICD-10-CM

## 2024-05-27 DIAGNOSIS — K449 Diaphragmatic hernia without obstruction or gangrene: Secondary | ICD-10-CM | POA: Diagnosis not present

## 2024-05-27 DIAGNOSIS — R6881 Early satiety: Secondary | ICD-10-CM

## 2024-05-27 DIAGNOSIS — R93429 Abnormal radiologic findings on diagnostic imaging of unspecified kidney: Secondary | ICD-10-CM

## 2024-05-27 DIAGNOSIS — K2289 Other specified disease of esophagus: Secondary | ICD-10-CM

## 2024-05-27 DIAGNOSIS — K2970 Gastritis, unspecified, without bleeding: Secondary | ICD-10-CM

## 2024-05-27 DIAGNOSIS — R140 Abdominal distension (gaseous): Secondary | ICD-10-CM

## 2024-05-27 DIAGNOSIS — K31A0 Gastric intestinal metaplasia, unspecified: Secondary | ICD-10-CM | POA: Diagnosis not present

## 2024-05-27 DIAGNOSIS — K2980 Duodenitis without bleeding: Secondary | ICD-10-CM | POA: Diagnosis not present

## 2024-05-27 DIAGNOSIS — R112 Nausea with vomiting, unspecified: Secondary | ICD-10-CM

## 2024-05-27 DIAGNOSIS — R1013 Epigastric pain: Secondary | ICD-10-CM

## 2024-05-27 MED ORDER — OMEPRAZOLE 40 MG PO CPDR: 40.0000 mg | DELAYED_RELEASE_CAPSULE | Freq: Every day | ORAL | 3 refills | Status: AC

## 2024-05-27 MED ORDER — SODIUM CHLORIDE 0.9 % IV SOLN: 500.0000 mL | Freq: Once | INTRAVENOUS | Status: DC

## 2024-05-27 MED ORDER — SUCRALFATE 1 GM/10ML PO SUSP: 1.0000 g | Freq: Two times a day (BID) | ORAL | 1 refills | Status: AC

## 2024-05-27 NOTE — Op Note (Signed)
 [facility] Patient Name: Anne Olson Procedure Date: 05/27/2024 9:41 AM MRN: [id number] Endoscopist: Aloha Finner , MD, [id number] Age: 48 Referring MD:  Date of Birth: 19-Nov-1975 Gender: Female Account #: [id number] Procedure:                Upper GI endoscopy Indications:              Abdominal bloating, Nausea with vomiting Medicines:                Monitored Anesthesia Care Procedure:                Pre-Anesthesia Assessment:                           - Prior to the procedure, a History and Physical                            was performed, and patient medications and                            allergies were reviewed. The patient's tolerance of                            previous anesthesia was also reviewed. The risks                            and benefits of the procedure and the sedation                            options and risks were discussed with the patient.                            All questions were answered, and informed consent                            was obtained. Prior Anticoagulants: The patient has                            taken no anticoagulant or antiplatelet agents                            except for NSAID medication. ASA Grade Assessment:                            III - A patient with severe systemic disease. After                            reviewing the risks and benefits, the patient was                            deemed in satisfactory condition to undergo the                            procedure.  After obtaining informed consent, the endoscope was                            passed under direct vision. Throughout the                            procedure, the patient's blood pressure, pulse, and                            oxygen saturations were monitored continuously. The                            GIF [id number] #[id number] was introduced through the                            mouth, and advanced to the  second part of duodenum.                            The upper GI endoscopy was accomplished without                            difficulty. The patient tolerated the procedure. Scope In: Scope Out: Findings:                 Patchy, yellow plaques were found in the entire                            esophagus. Biopsies were taken with a cold forceps                            for histology.                           The Z-line was irregular and was found 38 cm from                            the incisors.                           A 2 cm hiatal hernia was present.                           Multiple dispersed small erosions with no bleeding                            and no stigmata of recent bleeding were found in                            the gastric antrum.                           Patchy mildly erythematous mucosa without bleeding                            was found in the entire examined  stomach. Biopsies                            were taken with a cold forceps for histology and                            Helicobacter pylori testing.                           No gross lesions were noted in the duodenal bulb,                            in the first portion of the duodenum and in the                            second portion of the duodenum. Biopsies were taken                            with a cold forceps for histology. Complications:            No immediate complications. Estimated Blood Loss:     Estimated blood loss was minimal. Impression:               - Esophageal plaques were found, suspicious for                            candidiasis. Biopsied.                           - Z-line irregular, 38 cm from the incisors.                           - 2 cm hiatal hernia.                           - Erosive gastropathy with no bleeding and no                            stigmata of recent bleeding.                           - Erythematous mucosa in the stomach. Biopsied.                            - No gross lesions in the duodenal bulb, in the                            first portion of the duodenum and in the second                            portion of the duodenum. Biopsied. Recommendation:           - The patient will be observed post-procedure,                            until  all discharge criteria are met.                           - Discharge patient to home.                           - Patient has a contact number available for                            emergencies. The signs and symptoms of potential                            delayed complications were discussed with the                            patient. Return to normal activities tomorrow.                            Written discharge instructions were provided to the                            patient.                           - Resume previous diet.                           - Increase to Omeprazole  40 mg daily.                           - Carafate  1 g twice daily for 80-month.                           - Observe patient's clinical course.                           - Await pathology results.                           - The findings and recommendations were discussed                            with the patient.                           - The findings and recommendations were discussed                            with the patient's family. Aloha Finner, MD 05/27/2024 9:57:09 AM

## 2024-05-27 NOTE — Patient Instructions (Signed)
 Resume previous diet. Increase to Omeprazole  40 mg daily. Carafate  1 g twice daily for 1 month. Observe patient's clinical course. Awaiting pathology results.  Handout provided on hiatal hernia.   YOU HAD AN ENDOSCOPIC PROCEDURE TODAY AT THE [facility]:   Refer to the procedure report that was given to you for any specific questions about what was found during the examination.  If the procedure report does not answer your questions, please call your gastroenterologist to clarify.  If you requested that your care partner not be given the details of your procedure findings, then the procedure report has been included in a sealed envelope for you to review at your convenience later.  YOU SHOULD EXPECT: Some feelings of bloating in the abdomen. Passage of more gas than usual.  Walking can help get rid of the air that was put into your GI tract during the procedure and reduce the bloating. If you had a lower endoscopy (such as a colonoscopy or flexible sigmoidoscopy) you may notice spotting of blood in your stool or on the toilet paper. If you underwent a bowel prep for your procedure, you may not have a normal bowel movement for a few days.  Please Note:  You might notice some irritation and congestion in your nose or some drainage.  This is from the oxygen used during your procedure.  There is no need for concern and it should clear up in a day or so.  SYMPTOMS TO REPORT IMMEDIATELY:  Following upper endoscopy (EGD)  Vomiting of blood or coffee ground material  New chest pain or pain under the shoulder blades  Painful or persistently difficult swallowing  New shortness of breath  Fever of 100F or higher  Black, tarry-looking stools  For urgent or emergent issues, a gastroenterologist can be reached at any hour by calling [phone]. Do not use MyChart messaging for urgent concerns.    DIET:  We do recommend a small meal at first, but then you may proceed to your regular  diet.  Drink plenty of fluids but you should avoid alcoholic beverages for 24 hours.  ACTIVITY:  You should plan to take it easy for the rest of today and you should NOT DRIVE or use heavy machinery until tomorrow (because of the sedation medicines used during the test).    FOLLOW UP: Our staff will call the number listed on your records the next business day following your procedure.  We will call around 7:15- 8:00 am to check on you and address any questions or concerns that you may have regarding the information given to you following your procedure. If we do not reach you, we will leave a message.     If any biopsies were taken you will be contacted by phone or by letter within the next 1-3 weeks.  Please call us  at [phone] if you have not heard about the biopsies in 3 weeks.    SIGNATURES/CONFIDENTIALITY: You and/or your care partner have signed paperwork which will be entered into your electronic medical record.  These signatures attest to the fact that that the information above on your After Visit Summary has been reviewed and is understood.  Full responsibility of the confidentiality of this discharge information lies with you and/or your care-partner.

## 2024-05-27 NOTE — Progress Notes (Signed)
 0947 BP 161/126, Labetalol given IV, MD update, vss

## 2024-05-27 NOTE — Progress Notes (Signed)
 Called to room to assist during endoscopic procedure.  Patient ID and intended procedure confirmed with present staff. Received instructions for my participation in the procedure from the performing physician.

## 2024-05-27 NOTE — Progress Notes (Signed)
0942 Robinul 0.1 mg IV given due large amount of secretions upon assessment.  MD made aware, vss

## 2024-05-27 NOTE — Progress Notes (Signed)
 GASTROENTEROLOGY PROCEDURE H&P NOTE   Primary Care Physician: Lenon Nell SAILOR, FNP  HPI: Anne Olson is a 48 y.o. female who presents for EGD for evaluation of N/V/abdominal pain.  Past Medical History:  Diagnosis Date   Anemia    iron supplements in the past   Anxiety    has been prescribed Xanax   Asthma    has albuterol , advair prn, no attack x 2 years;triggered by strong smells (bleach and smoke)   Benign breast cyst in female 1995   had bx done was normal   DUB (dysfunctional uterine bleeding) 1995   Family hx cleft lip 04/29/2012   FOB's son    Gestational Hypertension 07/17/2012   Headache(784.0)    during and prior to pregnancy;usually Rt side of head;can effect vision   Heart murmur    Hx of preeclampsia, prior pregnancy, currently pregnant 03/12/2012   Induced at 38wks, no meds     Hypertension    Infection    Yeast inf;gets freq w/ antibxs or scented soaps   Infection    BV;recently treated for BV completed Flagyl    Memory loss 2013   Currently having difficult time remembering things   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   Ovarian cyst in pregnancy 2013   seen on recent US    Preeclampsia 1994   induced @ 38 weeks   Scoliosis    Past Surgical History:  Procedure Laterality Date   DILATION AND EVACUATION N/A 09/29/2012   Procedure: DILATATION AND EVACUATION;  Surgeon: Winton Felt, MD;  Location: [facility];  Service: Gynecology;  Laterality: N/A;   MULTIPLE EXTRACTIONS WITH ALVEOLOPLASTY Bilateral 05/04/2020   Procedure: MULTIPLE EXTRACTION WITH ALVEOLOPLASTY;  Surgeon: Sheryle Hamilton, DDS;  Location: [facility];  Service: Oral Surgery;  Laterality: Bilateral;   WISDOM TOOTH EXTRACTION  05/2011   all 4 removed   Current Outpatient Medications  Medication Sig Dispense Refill   albuterol  (PROVENTIL ) (2.5 MG/3ML) 0.083% nebulizer solution SMARTSIG:3 Milliliter(s) 3 Times Daily PRN     atropine 1 % ophthalmic solution Apply to eye.      brimonidine (ALPHAGAN) 0.2 % ophthalmic solution      budesonide -formoterol  (SYMBICORT ) 160-4.5 MCG/ACT inhaler Take 2 puffs first thing in am and then another 2 puffs about 12 hours later. 10.2 each 12   buPROPion (WELLBUTRIN SR) 150 MG 12 hr tablet Take 150 mg by mouth 2 (two) times daily.     cyclobenzaprine  (FLEXERIL ) 10 MG tablet Take 1 tablet (10 mg total) by mouth at bedtime as needed for muscle spasms. Office visit needed for additional refills. 1st notice. 15 tablet 0   dorzolamide (TRUSOPT) 2 % ophthalmic solution INSTILL 1 DROP INTO RIGHT EYE TWICE DAILY     dorzolamide-timolol (COSOPT) 2-0.5 % ophthalmic solution SMARTSIG:In Eye(s)     DULoxetine (CYMBALTA) 20 MG capsule Take 20 mg by mouth.     EPINEPHrine  0.3 mg/0.3 mL IJ SOAJ injection Inject 0.3 mg into the muscle as needed.     etonogestrel  (NEXPLANON ) 68 MG IMPL implant 1 each by Subdermal route once.     fluticasone (FLONASE) 50 MCG/ACT nasal spray Place 2 sprays into both nostrils daily.     gabapentin (NEURONTIN) 300 MG capsule Take 300 mg by mouth 3 (three) times daily as needed (Pain).      hydrochlorothiazide (HYDRODIURIL) 12.5 MG tablet Take 12.5 mg by mouth daily.     ibuprofen  (ADVIL ,MOTRIN ) 800 MG tablet Take 800 mg by  mouth every 8 (eight) hours as needed for mild pain or moderate pain.      levocetirizine (XYZAL) 5 MG tablet Take 5 mg by mouth daily.     lidocaine  (LIDODERM ) 5 % Place 1 patch onto the skin every 12 (twelve) hours as needed (Back pain). Remove & Discard patch within 12 hours or as directed by MD      metoprolol  succinate (TOPROL -XL) 25 MG 24 hr tablet Take 25 mg by mouth daily.     metoprolol  tartrate (LOPRESSOR ) 25 MG tablet Take 1 tablet once daily as needed for palpitations. 180 tablet 3   omeprazole  (PRILOSEC) 20 MG capsule Take 20 mg by mouth daily.     predniSONE  (DELTASONE ) 20 MG tablet Take 1.5 tablets (30 mg total) by mouth daily with breakfast. (Patient not taking: Reported on 05/20/2024)  15 tablet 0   predniSONE  (DELTASONE ) 20 MG tablet Take 2 tablets (40 mg total) by mouth daily with breakfast for 5 days, THEN 1 tablet (20 mg total) daily with breakfast for 5 days. 15 tablet 0   pregabalin (LYRICA) 25 MG capsule Take 25 mg by mouth 3 (three) times daily.     QULIPTA 60 MG TABS Take 1 tablet by mouth daily.     Tiotropium Bromide (SPIRIVA  RESPIMAT) 2.5 MCG/ACT AERS Inhale 2 puffs into the lungs daily. 4 g 12   tobramycin-dexamethasone  (TOBRADEX) ophthalmic solution INSTILL 1 DROP INTO AFFECTED EYE(S) BY OPHTHALMIC ROUTE BID x 7 days     traMADol  (ULTRAM ) 50 MG tablet Take 50 mg by mouth 3 (three) times daily as needed.     Ubrogepant (UBRELVY) 100 MG TABS Take 100 mg by mouth.     VENTOLIN  HFA 108 (90 Base) MCG/ACT inhaler Inhale into the lungs.     Vitamin D, Ergocalciferol, (DRISDOL) 1.25 MG (50000 UNIT) CAPS capsule Take 50,000 Units by mouth 2 (two) times a week.     No current facility-administered medications for this visit.   Current Medications[1] Allergies[2] Family History  Problem Relation Age of Onset   Cancer Mother        Throat   Seizures Mother    Ulcers Mother    Pancreatitis Mother    Cancer Father        Throat   Hypertension Father    Thyroid  disease Father        Hyperthyoid   Pancreatitis Father    COPD Father    Cirrhosis Father        Liver   Asthma Sister    Asthma Brother    Hypertension Paternal Grandmother    Heart failure Paternal Grandmother    Hypertension Paternal Aunt    Diabetes Maternal Aunt    Asthma Cousin    Asthma Paternal Aunt    Stroke Maternal Uncle    Stroke Paternal Uncle        x 2   Sickle cell trait Other        Nephew   Social History   Socioeconomic History   Marital status: Significant Other    Spouse name: Not on file   Number of children: 1   Years of education: 18   Highest education level: Bachelor's degree (e.g., BA, AB, BS)  Occupational History   Occupation: Resident Care Assistant/Med tech   Tobacco Use   Smoking status: Every Day    Current packs/day: 0.25    Average packs/day: 0.3 packs/day for 10.0 years (2.5 ttl pk-yrs)    Types: Cigarettes  Smokeless tobacco: Never   Tobacco comments:    Has smoked for 20 years, 05/18/24 down to 2 cigarettes daily  Vaping Use   Vaping status: Never Used  Substance and Sexual Activity   Alcohol use: Yes    Comment: Occasionally   Drug use: No   Sexual activity: Never    Partners: Male    Birth control/protection: Implant, None    Comment: Has Norplant in Lt arm that is still in place - placed in 1994  Other Topics Concern   Not on file  Social History Narrative   05/18/24 works full time, lives with wife, daughter, and niece   Social Drivers of Health   Tobacco Use: High Risk (05/27/2024)   Patient History    Smoking Tobacco Use: Every Day    Smokeless Tobacco Use: Never    Passive Exposure: Not on file  Financial Resource Strain: Low Risk (03/10/2024)   Received from [facility]   Overall Financial Resource Strain (CARDIA)    How hard is it for you to pay for the very basics like food, housing, medical care, and heating?: Not hard at all  Food Insecurity: No Food Insecurity (03/10/2024)   Received from [facility]   Epic    Within the past 12 months, you worried that your food would run out before you got the money to buy more.: Never true    Within the past 12 months, the food you bought just didn't last and you didn't have money to get more.: Never true  Transportation Needs: No Transportation Needs (03/10/2024)   Received from [facility]    In the past 12 months, has lack of transportation kept you from medical appointments or from getting medications?: No    In the past 12 months, has lack of transportation kept you from meetings, work, or from getting things needed for daily living?: No  Physical Activity: Not on file  Stress: Not on file  Social Connections: Not on file  Intimate Partner Violence: Not  on file  Depression (PHQ2-9): Low Risk (05/18/2024)   Depression (PHQ2-9)    PHQ-2 Score: 3  Alcohol Screen: Not on file  Housing: Low Risk (03/10/2024)   Received from [facility]    In the last 12 months, was there a time when you were not able to pay the mortgage or rent on time?: No    In the past 12 months, how many times have you moved where you were living?: 0    At any time in the past 12 months, were you homeless or living in a shelter (including now)?: No  Utilities: Not At Risk (03/10/2024)   Received from [facility]    In the past 12 months has the electric, gas, oil, or water company threatened to shut off services in your home?: No  Health Literacy: Not on file    Physical Exam: There were no vitals filed for this visit. There is no height or weight on file to calculate BMI. GEN: NAD EYE: Sclerae anicteric ENT: MMM CV: Non-tachycardic GI: Soft, NT/ND NEURO:  Alert & Oriented x 3  Lab Results: No results for input(s): WBC, HGB, HCT, PLT in the last 72 hours. BMET No results for input(s): NA, K, CL, CO2, GLUCOSE, BUN, CREATININE, CALCIUM in the last 72 hours. LFT No results for input(s): PROT, ALBUMIN, AST, ALT, ALKPHOS, BILITOT, BILIDIR, IBILI in the last 72 hours. PT/INR No results for  input(s): LABPROT, INR in the last 72 hours.   Impression / Plan: This is a 48 y.o.female who presents for EGD for evaluation of N/V/abdominal pain.  The risks and benefits of endoscopic evaluation/treatment were discussed with the patient and/or family; these include but are not limited to the risk of perforation, infection, bleeding, missed lesions, lack of diagnosis, severe illness requiring hospitalization, as well as anesthesia and sedation related illnesses.  The patient's history has been reviewed, patient examined, no change in status, and deemed stable for procedure.  The patient and/or family was provided an  opportunity to ask questions and all were answered.  The patient and/or family is agreeable to proceed.    Aloha Finner, MD [facility] Office # [phone]     [1]  Current Outpatient Medications:    albuterol  (PROVENTIL ) (2.5 MG/3ML) 0.083% nebulizer solution, SMARTSIG:3 Milliliter(s) 3 Times Daily PRN, Disp: , Rfl:    atropine 1 % ophthalmic solution, Apply to eye., Disp: , Rfl:    brimonidine (ALPHAGAN) 0.2 % ophthalmic solution, , Disp: , Rfl:    budesonide -formoterol  (SYMBICORT ) 160-4.5 MCG/ACT inhaler, Take 2 puffs first thing in am and then another 2 puffs about 12 hours later., Disp: 10.2 each, Rfl: 12   buPROPion (WELLBUTRIN SR) 150 MG 12 hr tablet, Take 150 mg by mouth 2 (two) times daily., Disp: , Rfl:    cyclobenzaprine  (FLEXERIL ) 10 MG tablet, Take 1 tablet (10 mg total) by mouth at bedtime as needed for muscle spasms. Office visit needed for additional refills. 1st notice., Disp: 15 tablet, Rfl: 0   dorzolamide (TRUSOPT) 2 % ophthalmic solution, INSTILL 1 DROP INTO RIGHT EYE TWICE DAILY, Disp: , Rfl:    dorzolamide-timolol (COSOPT) 2-0.5 % ophthalmic solution, SMARTSIG:In Eye(s), Disp: , Rfl:    DULoxetine (CYMBALTA) 20 MG capsule, Take 20 mg by mouth., Disp: , Rfl:    EPINEPHrine  0.3 mg/0.3 mL IJ SOAJ injection, Inject 0.3 mg into the muscle as needed., Disp: , Rfl:    etonogestrel  (NEXPLANON ) 68 MG IMPL implant, 1 each by Subdermal route once., Disp: , Rfl:    fluticasone (FLONASE) 50 MCG/ACT nasal spray, Place 2 sprays into both nostrils daily., Disp: , Rfl:    gabapentin (NEURONTIN) 300 MG capsule, Take 300 mg by mouth 3 (three) times daily as needed (Pain). , Disp: , Rfl:    hydrochlorothiazide (HYDRODIURIL) 12.5 MG tablet, Take 12.5 mg by mouth daily., Disp: , Rfl:    ibuprofen  (ADVIL ,MOTRIN ) 800 MG tablet, Take 800 mg by mouth every 8 (eight) hours as needed for mild pain or moderate pain. , Disp: , Rfl:    levocetirizine (XYZAL)  5 MG tablet, Take 5 mg by mouth daily., Disp: , Rfl:    lidocaine  (LIDODERM ) 5 %, Place 1 patch onto the skin every 12 (twelve) hours as needed (Back pain). Remove & Discard patch within 12 hours or as directed by MD , Disp: , Rfl:    metoprolol  succinate (TOPROL -XL) 25 MG 24 hr tablet, Take 25 mg by mouth daily., Disp: , Rfl:    metoprolol  tartrate (LOPRESSOR ) 25 MG tablet, Take 1 tablet once daily as needed for palpitations., Disp: 180 tablet, Rfl: 3   omeprazole  (PRILOSEC) 20 MG capsule, Take 20 mg by mouth daily., Disp: , Rfl:    predniSONE  (DELTASONE ) 20 MG tablet, Take 1.5 tablets (30 mg total) by mouth daily with breakfast. (Patient not taking: Reported on 05/20/2024), Disp: 15 tablet, Rfl: 0   predniSONE  (DELTASONE ) 20 MG tablet, Take  2 tablets (40 mg total) by mouth daily with breakfast for 5 days, THEN 1 tablet (20 mg total) daily with breakfast for 5 days., Disp: 15 tablet, Rfl: 0   pregabalin (LYRICA) 25 MG capsule, Take 25 mg by mouth 3 (three) times daily., Disp: , Rfl:    QULIPTA 60 MG TABS, Take 1 tablet by mouth daily., Disp: , Rfl:    Tiotropium Bromide (SPIRIVA  RESPIMAT) 2.5 MCG/ACT AERS, Inhale 2 puffs into the lungs daily., Disp: 4 g, Rfl: 12   tobramycin-dexamethasone  (TOBRADEX) ophthalmic solution, INSTILL 1 DROP INTO AFFECTED EYE(S) BY OPHTHALMIC ROUTE BID x 7 days, Disp: , Rfl:    traMADol  (ULTRAM ) 50 MG tablet, Take 50 mg by mouth 3 (three) times daily as needed., Disp: , Rfl:    Ubrogepant (UBRELVY) 100 MG TABS, Take 100 mg by mouth., Disp: , Rfl:    VENTOLIN  HFA 108 (90 Base) MCG/ACT inhaler, Inhale into the lungs., Disp: , Rfl:    Vitamin D, Ergocalciferol, (DRISDOL) 1.25 MG (50000 UNIT) CAPS capsule, Take 50,000 Units by mouth 2 (two) times a week., Disp: , Rfl:  [2]  Allergies Allergen Reactions   Bee Venom Anaphylaxis and Swelling    Throat swells  honey bee venom   Other Dermatitis, Anaphylaxis, Hives and Other (See Comments)    Solanum (organism)   Tomato  Anaphylaxis and Hives    & throat swelling

## 2024-05-27 NOTE — Progress Notes (Signed)
 Report given to PACU, vss

## 2024-05-28 ENCOUNTER — Telehealth: Payer: Self-pay | Admitting: *Deleted

## 2024-05-28 ENCOUNTER — Encounter (HOSPITAL_COMMUNITY)

## 2024-05-28 DIAGNOSIS — J449 Chronic obstructive pulmonary disease, unspecified: Secondary | ICD-10-CM

## 2024-05-28 NOTE — Telephone Encounter (Signed)
°  Follow up Call-     05/27/2024    9:15 AM  Call back number  Post procedure Call Back phone  # [phone]  Permission to leave phone message Yes     Post procedure follow up phone call. No answer at number given.  Left message on voicemail.

## 2024-05-28 NOTE — Progress Notes (Signed)
 Daily Session Note  Patient Details  Name: Anne Olson MRN: [id number] Date of Birth: [birth year] Referring Provider:   Conrad Ports Pulmonary Rehab Walk Test from 05/18/2024 in [facility]  Referring Provider Pawar    Encounter Date: 05/28/2024  Check In:  Session Check In - 05/28/24 0820       Check-In   Supervising physician immediately available to respond to emergencies [facility] MD immediately available    Physician(s) Barnie Hila, NP    Location [facility]    Staff Present Ronal Levin, RN, Maud Moats, MS, ACSM-CEP, Exercise Physiologist;Aris Moman Claudene, RT    Virtual Visit No    Medication changes reported     No    Fall or balance concerns reported    No    Tobacco Cessation No Change    Warm-up and Cool-down Performed as group-led instruction   only   Resistance Training Performed Yes    VAD Patient? No    PAD/SET Patient? No      Pain Assessment   Currently in Pain? No/denies    Pain Score 0-No pain    Multiple Pain Sites No          Capillary Blood Glucose: No results found for this or any previous visit (from the past 24 hours).    Tobacco Use History[1]  Goals Met:  Proper associated with RPD/PD & O2 Sat Independence with exercise equipment Exercise tolerated well No report of concerns or symptoms today Strength training completed today  Goals Unmet:  Not Applicable  Comments: Service time is from 0811 to [phone].    Dr. Slater Staff is Medical Director for Pulmonary Rehab at [facility].     [1]  Social History Tobacco Use  Smoking Status Every Day   Current packs/day: 0.25   Average packs/day: 0.3 packs/day for 10.0 years (2.5 ttl pk-yrs)   Types: Cigarettes  Smokeless Tobacco Never  Tobacco Comments   Has smoked for 20 years, 05/18/24 down to 2 cigarettes daily

## 2024-05-29 LAB — SURGICAL PATHOLOGY

## 2024-05-30 ENCOUNTER — Ambulatory Visit: Payer: Self-pay | Admitting: Gastroenterology

## 2024-06-02 ENCOUNTER — Encounter (HOSPITAL_COMMUNITY)
Admission: RE | Admit: 2024-06-02 | Discharge: 2024-06-02 | Disposition: A | Discharge status: Home / Self Care | Source: Ambulatory Visit

## 2024-06-02 VITALS — Wt 124.6 lb

## 2024-06-02 DIAGNOSIS — J449 Chronic obstructive pulmonary disease, unspecified: Secondary | ICD-10-CM

## 2024-06-02 NOTE — Progress Notes (Signed)
 Daily Session Note  Patient Details  Name: Anne Olson MRN: [id number] Date of Birth: 01-01-1976 Referring Provider:   Conrad Ports Pulmonary Rehab Walk Test from 05/18/2024 in [facility]  Referring Provider Pawar    Encounter Date: 06/02/2024  Check In:  Session Check In - 06/02/24 0818       Check-In   Supervising physician immediately available to respond to emergencies [facility] MD immediately available    Physician(s) Barnie Hila, NP    Location [facility]    Staff Present Ronal Levin, RN, Wetzel Sharps, RT    Virtual Visit No    Medication changes reported     No    Fall or balance concerns reported    No    Tobacco Cessation No Change    Warm-up and Cool-down Performed as group-led instruction   only   Resistance Training Performed Yes    VAD Patient? No    PAD/SET Patient? No      Pain Assessment   Currently in Pain? No/denies    Pain Score 0-No pain    Multiple Pain Sites No          Capillary Blood Glucose: No results found for this or any previous visit (from the past 24 hours).   Exercise Prescription Changes - 06/02/24 0900       Response to Exercise   Blood Pressure (Admit) 142/86    Blood Pressure (Exercise) 136/72    Blood Pressure (Exit) 118/82    Heart Rate (Admit) 121 bpm    Heart Rate (Exercise) 117 bpm    Heart Rate (Exit) 112 bpm    Oxygen Saturation (Admit) 100 %    Oxygen Saturation (Exercise) 98 %    Oxygen Saturation (Exit) 99 %    Rating of Perceived Exertion (Exercise) 8    Perceived Dyspnea (Exercise) 1    Duration Continue with 30 min of aerobic exercise without signs/symptoms of physical distress.    Intensity THRR unchanged      Progression   Progression Continue to progress workloads to maintain intensity without signs/symptoms of physical distress.      Resistance Training   Weight red bands    Reps 10-15    Time 10 Minutes       Recumbant Bike   Level 1    RPM 43    Watts 9    Minutes 15    METs 1.7      NuStep   Level 3    SPM 71    Minutes 15    METs 2.4          Tobacco Use History[1]  Goals Met:  Proper associated with RPD/PD & O2 Sat Independence with exercise equipment Exercise tolerated well No report of concerns or symptoms today Strength training completed today  Goals Unmet:  Not Applicable  Comments: Service time is from 0807 to 0915.    Dr. Slater Staff is Medical Director for Pulmonary Rehab at [facility].     [1]  Social History Tobacco Use  Smoking Status Every Day   Current packs/day: 0.25   Average packs/day: 0.3 packs/day for 10.0 years (2.5 ttl pk-yrs)   Types: Cigarettes  Smokeless Tobacco Never  Tobacco Comments   Has smoked for 20 years, 05/18/24 down to 2 cigarettes daily

## 2024-06-03 NOTE — Progress Notes (Signed)
 Pulmonary Individual Treatment Plan  Patient Details  Name: Anne Olson MRN: [id number] Date of Birth: [birth year] Referring Provider:   Conrad Ports Pulmonary Rehab Walk Test from 05/18/2024 in [facility]  Referring Provider Pawar    Initial Encounter Date:  Flowsheet Row Pulmonary Rehab Walk Test from 05/18/2024 in [facility]  Date 05/18/24    Visit Diagnosis: Stage 3 severe COPD by GOLD classification (HCC)  Patient's Home Medications on Admission:  Current Medications[1]  Past Medical History: Past Medical History:  Diagnosis Date   Anemia    iron supplements in the past   Anxiety    has been prescribed Xanax   Asthma    has albuterol , advair prn, no attack x 2 years;triggered by strong smells (bleach and smoke)   Benign breast cyst in female 1995   had bx done was normal   Chronic kidney disease    COPD (chronic obstructive pulmonary disease) (HCC)    DUB (dysfunctional uterine bleeding) 1995   Family hx cleft lip 04/29/2012   FOB's son    GERD (gastroesophageal reflux disease)    Gestational Hypertension 07/17/2012   Glaucoma    Headache(784.0)    during and prior to pregnancy;usually Rt side of head;can effect vision   Heart murmur    Hx of preeclampsia, prior pregnancy, currently pregnant 03/12/2012   Induced at 38wks, no meds     Hypertension    Infection    Yeast inf;gets freq w/ antibxs or scented soaps   Infection    BV;recently treated for BV completed Flagyl    Memory loss 2013   Currently having difficult time remembering things   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   Ovarian cyst in pregnancy 2013   seen on recent US    Preeclampsia 1994   induced @ 38 weeks   Scoliosis     Tobacco Use: Tobacco Use History[2]  Labs: Review Flowsheet       11/24/2007 04/05/2008  Labs for ITP Cardiac and Pulmonary  Rehab  TCO2 23  24     Capillary Blood Glucose: No results found for: GLUCAP   Pulmonary Assessment Scores:  Pulmonary Assessment Scores     Row Name 05/18/24 0929         ADL UCSD   ADL Phase Entry     SOB Score total 54       CAT Score   CAT Score 30       mMRC Score   mMRC Score 3       UCSD: Self-administered rating of dyspnea associated with activities of daily living (ADLs) 6-point scale (0 = not at all to 5 = maximal or unable to do because of breathlessness)  Scoring Scores range from 0 to 120.  Minimally important difference is 5 units  CAT: CAT can identify the health impairment of COPD patients and is better correlated with disease progression.  CAT has a scoring range of zero to 40. The CAT score is classified into four groups of low (less than 10), medium (10 - 20), high (21-30) and very high (31-40) based on the impact level of disease on health status. A CAT score over 10 suggests significant symptoms.  A worsening CAT score could be explained by an exacerbation, poor medication adherence, poor inhaler technique, or progression of COPD or comorbid conditions.  CAT MCID is 2  points  mMRC: mMRC (Modified Medical Research Council) Dyspnea Scale is used to assess the degree of baseline functional disability in patients of respiratory disease due to dyspnea. No minimal important difference is established. A decrease in score of 1 point or greater is considered a positive change.   Pulmonary Function Assessment:  Pulmonary Function Assessment - 05/18/24 0929       Breath   Bilateral Breath Sounds Clear    Shortness of Breath Yes;Limiting activity          Exercise Target Goals: Exercise Program Goal: Individual exercise prescription set using results from initial 6 min walk test and THRR while considering  patients activity barriers and safety.   Exercise Prescription Goal: Initial exercise prescription builds to 30-45 minutes a day of aerobic  activity, 2-3 days per week.  Home exercise guidelines will be given to patient during program as part of exercise prescription that the participant will acknowledge.  Activity Barriers & Risk Stratification:  Activity Barriers & Cardiac Risk Stratification - 05/18/24 0926       Activity Barriers & Cardiac Risk Stratification   Activity Barriers Deconditioning;Muscular Weakness;Shortness of Breath;Back Problems;Other (comment)    Comments b/l foot pain          6 Minute Walk:  6 Minute Walk     Row Name 05/18/24 1018         6 Minute Walk   Phase Initial     Distance 1145 feet     Walk Time 6 minutes     # of Rest Breaks 0     MPH 2.17     METS 4.82     RPE 12     Perceived Dyspnea  1     VO2 Peak 16.88     Symptoms No     Resting HR 113 bpm     Resting BP 128/80     Resting Oxygen Saturation  99 %     Exercise Oxygen Saturation  during 6 min walk 98 %     Max Ex. HR 134 bpm     Max Ex. BP 142/84     2 Minute Post BP 128/80       Interval Oxygen   Interval Oxygen? Yes     Baseline Oxygen Saturation % 99 %     1 Minute Oxygen Saturation % 100 %     1 Minute Liters of Oxygen 0 L     2 Minute Oxygen Saturation % 99 %     2 Minute Liters of Oxygen 0 L     3 Minute Oxygen Saturation % 99 %     3 Minute Liters of Oxygen 0 L     4 Minute Oxygen Saturation % 98 %     4 Minute Liters of Oxygen 0 L     5 Minute Oxygen Saturation % 98 %     5 Minute Liters of Oxygen 0 L     6 Minute Oxygen Saturation % 100 %     6 Minute Liters of Oxygen 0 L     2 Minute Post Oxygen Saturation % 100 %     2 Minute Post Liters of Oxygen 0 L        Oxygen Initial Assessment:  Oxygen Initial Assessment - 05/18/24 0927       Home Oxygen   Home Oxygen Device None    Sleep Oxygen Prescription None    Home Exercise Oxygen Prescription None  Home Resting Oxygen Prescription None      Initial 6 min Walk   Oxygen Used None      Program Oxygen Prescription   Program Oxygen  Prescription None      Intervention   Short Term Goals To learn and understand importance of maintaining oxygen saturations>88%;To learn and understand importance of monitoring SPO2 with pulse oximeter and demonstrate accurate use of the pulse oximeter.;To learn and demonstrate proper pursed lip breathing techniques or other breathing techniques. ;To learn and demonstrate proper use of respiratory medications    Long  Term Goals Maintenance of O2 saturations>88%;Compliance with respiratory medication;Verbalizes importance of monitoring SPO2 with pulse oximeter and return demonstration;Exhibits proper breathing techniques, such as pursed lip breathing or other method taught during program session;Demonstrates proper use of MDIs          Oxygen Re-Evaluation:  Oxygen Re-Evaluation     Row Name 05/21/24 1632             Program Oxygen Prescription   Program Oxygen Prescription None         Home Oxygen   Home Oxygen Device None       Sleep Oxygen Prescription None       Home Exercise Oxygen Prescription None       Home Resting Oxygen Prescription None         Goals/Expected Outcomes   Short Term Goals To learn and understand importance of maintaining oxygen saturations>88%;To learn and understand importance of monitoring SPO2 with pulse oximeter and demonstrate accurate use of the pulse oximeter.;To learn and demonstrate proper pursed lip breathing techniques or other breathing techniques. ;To learn and demonstrate proper use of respiratory medications       Long  Term Goals Maintenance of O2 saturations>88%;Compliance with respiratory medication;Verbalizes importance of monitoring SPO2 with pulse oximeter and return demonstration;Exhibits proper breathing techniques, such as pursed lip breathing or other method taught during program session;Demonstrates proper use of MDIs       Goals/Expected Outcomes Compliance and understanding of oxygen saturation monitoring and breathing techniques  to decrease shortness of breath.          Oxygen Discharge (Final Oxygen Re-Evaluation):  Oxygen Re-Evaluation - 05/21/24 1632       Program Oxygen Prescription   Program Oxygen Prescription None      Home Oxygen   Home Oxygen Device None    Sleep Oxygen Prescription None    Home Exercise Oxygen Prescription None    Home Resting Oxygen Prescription None      Goals/Expected Outcomes   Short Term Goals To learn and understand importance of maintaining oxygen saturations>88%;To learn and understand importance of monitoring SPO2 with pulse oximeter and demonstrate accurate use of the pulse oximeter.;To learn and demonstrate proper pursed lip breathing techniques or other breathing techniques. ;To learn and demonstrate proper use of respiratory medications    Long  Term Goals Maintenance of O2 saturations>88%;Compliance with respiratory medication;Verbalizes importance of monitoring SPO2 with pulse oximeter and return demonstration;Exhibits proper breathing techniques, such as pursed lip breathing or other method taught during program session;Demonstrates proper use of MDIs    Goals/Expected Outcomes Compliance and understanding of oxygen saturation monitoring and breathing techniques to decrease shortness of breath.          Initial Exercise Prescription:  Initial Exercise Prescription - 05/18/24 1000       Date of Initial Exercise RX and Referring Provider   Date 05/18/24    Referring Provider Pawar  Expected Discharge Date 08/21/23      Recumbant Bike   Level 1    RPM 30    Watts 5    Minutes 15    METs 1.5      NuStep   Level 1    SPM 34    Minutes 15    METs 1.4      Prescription Details   Frequency (times per week) 2    Duration Progress to 30 minutes of continuous aerobic without signs/symptoms of physical distress      Intensity   THRR 40-80% of Max Heartrate 69-138    Ratings of Perceived Exertion 11-13    Perceived Dyspnea 0-4      Progression    Progression Continue to progress workloads to maintain intensity without signs/symptoms of physical distress.      Resistance Training   Training Prescription Yes    Weight red bands    Reps 10-15          Perform Capillary Blood Glucose checks as needed.  Exercise Prescription Changes:   Exercise Prescription Changes     Row Name 06/02/24 0900             Response to Exercise   Blood Pressure (Admit) 142/86       Blood Pressure (Exercise) 136/72       Blood Pressure (Exit) 118/82       Heart Rate (Admit) 121 bpm       Heart Rate (Exercise) 117 bpm       Heart Rate (Exit) 112 bpm       Oxygen Saturation (Admit) 100 %       Oxygen Saturation (Exercise) 98 %       Oxygen Saturation (Exit) 99 %       Rating of Perceived Exertion (Exercise) 8       Perceived Dyspnea (Exercise) 1       Duration Continue with 30 min of aerobic exercise without signs/symptoms of physical distress.       Intensity THRR unchanged         Progression   Progression Continue to progress workloads to maintain intensity without signs/symptoms of physical distress.         Resistance Training   Weight red bands       Reps 10-15       Time 10 Minutes         Recumbant Bike   Level 1       RPM 43       Watts 9       Minutes 15       METs 1.7         NuStep   Level 3       SPM 71       Minutes 15       METs 2.4          Exercise Comments:   Exercise Goals and Review:   Exercise Goals     Row Name 05/18/24 0927             Exercise Goals   Increase Physical Activity Yes       Intervention Provide advice, education, support and counseling about physical activity/exercise needs.;Develop an individualized exercise prescription for aerobic and resistive training based on initial evaluation findings, risk stratification, comorbidities and participant's personal goals.       Expected Outcomes Short Term: Attend rehab on a regular basis to increase amount of physical  activity.;Long  Term: Exercising regularly at least 3-5 days a week.;Long Term: Add in home exercise to make exercise part of routine and to increase amount of physical activity.       Increase Strength and Stamina Yes       Intervention Provide advice, education, support and counseling about physical activity/exercise needs.;Develop an individualized exercise prescription for aerobic and resistive training based on initial evaluation findings, risk stratification, comorbidities and participant's personal goals.       Expected Outcomes Short Term: Increase workloads from initial exercise prescription for resistance, speed, and METs.;Short Term: Perform resistance training exercises routinely during rehab and add in resistance training at home;Long Term: Improve cardiorespiratory fitness, muscular endurance and strength as measured by increased METs and functional capacity ( )       Able to understand and use rate of perceived exertion (RPE) scale Yes       Intervention Provide education and explanation on how to use RPE scale       Expected Outcomes Short Term: Able to use RPE daily in rehab to express subjective intensity level;Long Term:  Able to use RPE to guide intensity level when exercising independently       Able to understand and use Dyspnea scale Yes       Intervention Provide education and explanation on how to use Dyspnea scale       Expected Outcomes Short Term: Able to use Dyspnea scale daily in rehab to express subjective sense of shortness of breath during exertion;Long Term: Able to use Dyspnea scale to guide intensity level when exercising independently       Knowledge and understanding of Target Heart Rate Range (THRR) Yes       Intervention Provide education and explanation of THRR including how the numbers were predicted and where they are located for reference       Expected Outcomes Short Term: Able to state/look up THRR;Short Term: Able to use daily as guideline for intensity in rehab;Long  Term: Able to use THRR to govern intensity when exercising independently       Understanding of Exercise Prescription Yes       Intervention Provide education, explanation, and written materials on patient's individual exercise prescription       Expected Outcomes Short Term: Able to explain program exercise prescription;Long Term: Able to explain home exercise prescription to exercise independently          Exercise Goals Re-Evaluation :  Exercise Goals Re-Evaluation     Row Name 05/21/24 1632             Exercise Goal Re-Evaluation   Exercise Goals Review Increase Physical Activity;Able to understand and use Dyspnea scale;Understanding of Exercise Prescription;Increase Strength and Stamina;Knowledge and understanding of Target Heart Rate Range (THRR);Able to understand and use rate of perceived exertion (RPE) scale       Comments Anne Olson is scheduled to begin exercise on 12/16. Will continue to monitor and progress as able.       Expected Outcomes Through exercise at rehab and home, the patient will decrease shortness of breath with daily activities and feel confident in carrying out an exercise regimen at home          Discharge Exercise Prescription (Final Exercise Prescription Changes):  Exercise Prescription Changes - 06/02/24 0900       Response to Exercise   Blood Pressure (Admit) 142/86    Blood Pressure (Exercise) 136/72    Blood Pressure (Exit) 118/82    Heart  Rate (Admit) 121 bpm    Heart Rate (Exercise) 117 bpm    Heart Rate (Exit) 112 bpm    Oxygen Saturation (Admit) 100 %    Oxygen Saturation (Exercise) 98 %    Oxygen Saturation (Exit) 99 %    Rating of Perceived Exertion (Exercise) 8    Perceived Dyspnea (Exercise) 1    Duration Continue with 30 min of aerobic exercise without signs/symptoms of physical distress.    Intensity THRR unchanged      Progression   Progression Continue to progress workloads to maintain intensity without signs/symptoms of physical  distress.      Resistance Training   Weight red bands    Reps 10-15    Time 10 Minutes      Recumbant Bike   Level 1    RPM 43    Watts 9    Minutes 15    METs 1.7      NuStep   Level 3    SPM 71    Minutes 15    METs 2.4          Nutrition:  Target Goals: Understanding of nutrition guidelines, daily intake of sodium 1500mg , cholesterol 200mg , calories 30% from fat and 7% or less from saturated fats, daily to have 5 or more servings of fruits and vegetables.  Biometrics:  Pre Biometrics - 05/18/24 0915       Pre Biometrics   Grip Strength 10 kg           Nutrition Therapy Plan and Nutrition Goals:  Nutrition Therapy & Goals - 05/26/24 0942       Nutrition Therapy   Diet Regular diet      Personal Nutrition Goals   Nutrition Goal Patient to identify strategies for weight gain of 0.5-2 # per week.    Comments Patient with medical history significant for COPD. Reports difficulty gaining weight due to ongoing GI issues; pt reports experiencing nausea, bloating, abdominal discomfort with food intake. Current BMI 18.7 kg/m2 (low end of ideal range) based on today's weight of 124.8 lb. Pt endorses minimal intake on some days due to GI symptoms; scheduled for endoscopy tomorrow. RD encouraged small, frequent meals/snacks composed of high calorie/high protein foods. Also discussed supplementing diet with high calorie oral nutrition supplements such as Equate Plus. Patient will benefit from participation in pulmonary rehab for nutrition education, exercise, and lifestyle modification.      Intervention Plan   Intervention Prescribe, educate and counsel regarding individualized specific dietary modifications aiming towards targeted core components such as weight, hypertension, lipid management, diabetes, heart failure and other comorbidities.;Nutrition handout(s) given to patient.   Handouts: Weight Gain   Expected Outcomes Short Term Goal: Understand basic principles of  dietary content, such as calories, fat, sodium, cholesterol and nutrients.;Long Term Goal: Adherence to prescribed nutrition plan.          Nutrition Assessments:  MEDIFICTS Score Key: >=70 Need to make dietary changes  40-70 Heart Healthy Diet <= 40 Therapeutic Level Cholesterol Diet   Picture Your Plate Scores: <59 Unhealthy dietary pattern with much room for improvement. 41-50 Dietary pattern unlikely to meet recommendations for good health and room for improvement. 51-60 More healthful dietary pattern, with some room for improvement.  >60 Healthy dietary pattern, although there may be some specific behaviors that could be improved.    Nutrition Goals Re-Evaluation:   Nutrition Goals Discharge (Final Nutrition Goals Re-Evaluation):   Psychosocial: Target Goals: Acknowledge presence or absence of significant  depression and/or stress, maximize coping skills, provide positive support system. Participant is able to verbalize types and ability to use techniques and skills needed for reducing stress and depression.  Initial Review & Psychosocial Screening:  Initial Psych Review & Screening - 05/18/24 0933       Initial Review   Current issues with Current Anxiety/Panic;Current Psychotropic Meds;Current Sleep Concerns      Family Dynamics   Good Support System? Yes    Comments Pt stated she has good support at home from her wife, daughter and niece. She states she has sleep disturbances and states she can't fall asleep or stay asleep due to leg cramps. Anne Olson states that she has recently been started on Lyrica and Cymbalta for chronic pain and neuropathy. She states she has been doing family therapy after the passing of her dad, who she was his primary caregiver. Anne Olson states her anxiety has been stable, but she still gets heart palpitations and takes meds as needed. She denies any needs, resources or referrals at this time.      Barriers   Psychosocial barriers to  participate in program Psychosocial barriers identified (see note)      Screening Interventions   Interventions Encouraged to exercise;Provide feedback about the scores to participant    Expected Outcomes Short Term goal: Utilizing psychosocial counselor, staff and physician to assist with identification of specific Stressors or current issues interfering with healing process. Setting desired goal for each stressor or current issue identified.;Long Term Goal: Stressors or current issues are controlled or eliminated.;Short Term goal: Identification and review with participant of any Quality of Life or Depression concerns found by scoring the questionnaire.;Long Term goal: The participant improves quality of Life and PHQ9 Scores as seen by post scores and/or verbalization of changes          Quality of Life Scores:  Scores of 19 and below usually indicate a poorer quality of life in these areas.  A difference of  2-3 points is a clinically meaningful difference.  A difference of 2-3 points in the total score of the Quality of Life Index has been associated with significant improvement in overall quality of life, self-image, physical symptoms, and general health in studies assessing change in quality of life.  PHQ-9: Review Flowsheet       05/18/2024 09/08/2016 06/07/2016  Depression screen PHQ 2/9  Decreased Interest 0 0 0  Down, Depressed, Hopeless 0 0 0  PHQ - 2 Score 0 0 0  Altered sleeping 3 - -  Tired, decreased energy 0 - -  Change in appetite 0 - -  Feeling bad or failure about yourself  0 - -  Trouble concentrating 0 - -  Moving slowly or fidgety/restless 0 - -  Suicidal thoughts 0 - -  PHQ-9 Score 3 - -  Difficult doing work/chores Not difficult at all - -   Interpretation of Total Score  Total Score Depression Severity:  1-4 = Minimal depression, 5-9 = Mild depression, 10-14 = Moderate depression, 15-19 = Moderately severe depression, 20-27 = Severe depression    Psychosocial Evaluation and Intervention:  Psychosocial Evaluation - 05/18/24 1052       Psychosocial Evaluation & Interventions   Interventions Encouraged to exercise with the program and follow exercise prescription;Stress management education;Relaxation education    Comments Pt stated she has good support at home from her wife, daughter and niece. She states she has sleep disturbances and states she can't fall asleep or stay asleep due to  leg cramps. Alvis states that she has recently been started on Lyrica and Cymbalta for chronic pain and neuropathy. She states she has been doing family therapy after the passing of her dad, who she was his primary caregiver. Anne Olson states her anxiety has been stable, but she still gets heart palpitations and takes meds as needed. She denies any needs, resources or referrals at this time.    Expected Outcomes For [place] to reduce stress, get better sleep and participate in PR without any other barriers or concerns    Continue Psychosocial Services  Follow up required by staff          Psychosocial Re-Evaluation:  Psychosocial Re-Evaluation     Row Name 05/22/24 [phone]             Psychosocial Re-Evaluation   Current issues with Current Anxiety/Panic;Current Psychotropic Meds;Current Sleep Concerns       Comments Anne Olson is scheduled to start PR on 05/26/24. No new barriers or concerns since orientation.       Expected Outcomes For Landry to participate in PR with no psy/soc barriers or concerns       Interventions Encouraged to attend Pulmonary Rehabilitation for the exercise       Continue Psychosocial Services  Follow up required by staff          Psychosocial Discharge (Final Psychosocial Re-Evaluation):  Psychosocial Re-Evaluation - 05/22/24 0842       Psychosocial Re-Evaluation   Current issues with Current Anxiety/Panic;Current Psychotropic Meds;Current Sleep Concerns    Comments Asjah is scheduled to start PR on 05/26/24. No  new barriers or concerns since orientation.    Expected Outcomes For Anne Olson to participate in PR with no psy/soc barriers or concerns    Interventions Encouraged to attend Pulmonary Rehabilitation for the exercise    Continue Psychosocial Services  Follow up required by staff          Education: Education Goals: Education classes will be provided on a weekly basis, covering required topics. Participant will state understanding/return demonstration of topics presented.  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 Instructor discusses importance of knowing and understanding resting, exercise, and post-exercise oxygen saturation, heart rate, and blood pressure. Oxygen saturation, heart rate, blood pressure, rating of perceived exertion, and dyspnea are reviewed along with a normal range for these values.  Flowsheet Row PULMONARY REHAB CHRONIC OBSTRUCTIVE PULMONARY DISEASE from 05/28/2024 in [facility] [facility]  Date 05/28/24  Educator EP  Instruction Review Code 1- Verbalizes Understanding    Exercise for the Pulmonary Patient Group instruction that is supported by a PowerPoint presentation. Instructor discusses benefits of exercise, core components of exercise, frequency, duration, and intensity of an exercise routine, importance of utilizing pulse oximetry during exercise, safety while exercising, and options of places to exercise outside of rehab.    MET Level  Group instruction provided by PowerPoint, verbal discussion, and written material to support subject matter. Instructor reviews what METs are and how to increase METs.    Pulmonary Medications Verbally interactive  group education provided by instructor with focus on inhaled medications and proper administration.   Anatomy and Physiology of the Respiratory System Group instruction  provided by PowerPoint, verbal discussion, and written material to support subject matter. Instructor reviews respiratory cycle and anatomical components of the respiratory system and their functions. Instructor also reviews differences in obstructive and restrictive respiratory diseases with examples of each.    Oxygen Safety Group instruction provided by PowerPoint, verbal discussion, and written material to support subject matter. There is an overview of What is Oxygen and Why do we need it.  Instructor also reviews how to create a safe environment for oxygen use, the importance of using oxygen as prescribed, and the risks of noncompliance. There is a brief discussion on traveling with oxygen and resources the patient may utilize.   Oxygen Use Group instruction provided by PowerPoint, verbal discussion, and written material to discuss how supplemental oxygen is prescribed and different types of oxygen supply systems. Resources for more information are provided.    Breathing Techniques Group instruction that is supported by demonstration and informational handouts. Instructor discusses the benefits of pursed lip and diaphragmatic breathing and detailed demonstration on how to perform both.     Risk Factor Reduction Group instruction that is supported by a PowerPoint presentation. Instructor discusses the definition of a risk factor, different risk factors for pulmonary disease, and how the heart and lungs work together.   Pulmonary Diseases Group instruction provided by PowerPoint, verbal discussion, and written material to support subject matter. Instructor gives an overview of the different type of pulmonary diseases. There is also a discussion on risk factors and symptoms as well as ways to manage the  diseases.   Stress and Energy Conservation Group instruction provided by PowerPoint, verbal discussion, and written material to support subject matter. Instructor gives an overview of stress and the impact it can have on the body. Instructor also reviews ways to reduce stress. There is also a discussion on energy conservation and ways to conserve energy throughout the day.   Warning Signs and Symptoms Group instruction provided by PowerPoint, verbal discussion, and written material to support subject matter. Instructor reviews warning signs and symptoms of stroke, heart attack, cold and flu. Instructor also reviews ways to prevent the spread of infection.   Other Education Group or individual verbal, written, or video instructions that support the educational goals of the pulmonary rehab program.    Knowledge Questionnaire Score:  Knowledge Questionnaire Score - 05/18/24 1054       Knowledge Questionnaire Score   Pre Score 17/18          Core Components/Risk Factors/Patient Goals at Admission:  Personal Goals and Risk Factors at Admission - 05/18/24 0943       Core Components/Risk Factors/Patient Goals on Admission    Weight Management Weight Gain;Yes    Intervention Weight Management: Provide education and appropriate resources to help participant work on and attain dietary goals.;Weight Management: Develop a combined nutrition and exercise program designed to reach desired caloric intake, while maintaining appropriate intake of nutrient and fiber, sodium and fats, and appropriate energy expenditure required for the weight goal.    Expected Outcomes Short Term: Continue to assess and modify interventions until short term weight is achieved;Long Term: Adherence to nutrition and physical activity/exercise program aimed toward attainment of established weight goal;Weight Maintenance: Understanding of the daily nutrition guidelines, which includes 25-35% calories from fat, 7% or less  cal from saturated fats, less than 200mg  cholesterol, less than 1.5gm of sodium, & 5 or more servings of fruits and vegetables daily;Understanding recommendations for meals to include 15-35% energy as protein, 25-35% energy  from fat, 35-60% energy from carbohydrates, less than 200mg  of dietary cholesterol, 20-35 gm of total fiber daily;Understanding of distribution of calorie intake throughout the day with the consumption of 4-5 meals/snacks;Weight Gain: Understanding of general recommendations for a high calorie, high protein meal plan that promotes weight gain by distributing calorie intake throughout the day with the consumption for 4-5 meals, snacks, and/or supplements    Improve shortness of breath with ADL's Yes    Intervention Provide education, individualized exercise plan and daily activity instruction to help decrease symptoms of SOB with activities of daily living.    Expected Outcomes Short Term: Improve cardiorespiratory fitness to achieve a reduction of symptoms when performing ADLs;Long Term: Be able to perform more ADLs without symptoms or delay the onset of symptoms          Core Components/Risk Factors/Patient Goals Review:   Goals and Risk Factor Review     Row Name 05/22/24 0845             Core Components/Risk Factors/Patient Goals Review   Personal Goals Review Weight Management/Obesity;Improve shortness of breath with ADL's;Develop more efficient breathing techniques such as purse lipped breathing and diaphragmatic breathing and practicing self-pacing with activity.       Review Monthly review of patients Core Components/Risk Factors/Patient Goals are as follows: Anne Olson is scheduled to start PR on 05/26/24. Unable to asses her goals at this time.       Expected Outcomes To improve shortness of breath with ADL's, develop more efficient breathing techniques such as purse lipped breathing and diaphragmatic breathing; and practicing self-pacing with activity and gain weight           Core Components/Risk Factors/Patient Goals at Discharge (Final Review):   Goals and Risk Factor Review - 05/22/24 0845       Core Components/Risk Factors/Patient Goals Review   Personal Goals Review Weight Management/Obesity;Improve shortness of breath with ADL's;Develop more efficient breathing techniques such as purse lipped breathing and diaphragmatic breathing and practicing self-pacing with activity.    Review Monthly review of patients Core Components/Risk Factors/Patient Goals are as follows: Anne Olson is scheduled to start PR on 05/26/24. Unable to asses her goals at this time.    Expected Outcomes To improve shortness of breath with ADL's, develop more efficient breathing techniques such as purse lipped breathing and diaphragmatic breathing; and practicing self-pacing with activity and gain weight          ITP Comments:Pt is making expected progress toward Pulmonary Rehab goals after completing 3 session(s). Recommend continued exercise, life style modification, education, and utilization of breathing techniques to increase stamina and strength, while also decreasing shortness of breath with exertion.  Dr. Slater Staff is Medical Director for Pulmonary Rehab at [facility].     Comments:      [1]  Current Outpatient Medications:    albuterol  (PROVENTIL ) (2.5 MG/3ML) 0.083% nebulizer solution, SMARTSIG:3 Milliliter(s) 3 Times Daily PRN, Disp: , Rfl:    atropine 1 % ophthalmic solution, Apply to eye., Disp: , Rfl:    brimonidine (ALPHAGAN) 0.2 % ophthalmic solution, , Disp: , Rfl:    budesonide -formoterol  (SYMBICORT ) 160-4.5 MCG/ACT inhaler, Take 2 puffs first thing in am and then another 2 puffs about 12 hours later., Disp: 10.2 each, Rfl: 12   buPROPion (WELLBUTRIN SR) 150 MG 12 hr tablet, Take 150 mg by mouth 2 (two) times daily., Disp: , Rfl:    cyclobenzaprine  (FLEXERIL ) 10 MG tablet, Take 1 tablet (10 mg total) by mouth  at bedtime as needed for muscle  spasms. Office visit needed for additional refills. 1st notice., Disp: 15 tablet, Rfl: 0   dorzolamide (TRUSOPT) 2 % ophthalmic solution, INSTILL 1 DROP INTO RIGHT EYE TWICE DAILY, Disp: , Rfl:    dorzolamide-timolol (COSOPT) 2-0.5 % ophthalmic solution, SMARTSIG:In Eye(s), Disp: , Rfl:    DULoxetine (CYMBALTA) 20 MG capsule, Take 20 mg by mouth., Disp: , Rfl:    EPINEPHrine  0.3 mg/0.3 mL IJ SOAJ injection, Inject 0.3 mg into the muscle as needed., Disp: , Rfl:    etonogestrel  (NEXPLANON ) 68 MG IMPL implant, 1 each by Subdermal route once., Disp: , Rfl:    fluticasone (FLONASE) 50 MCG/ACT nasal spray, Place 2 sprays into both nostrils daily., Disp: , Rfl:    gabapentin (NEURONTIN) 300 MG capsule, Take 300 mg by mouth 3 (three) times daily as needed (Pain). , Disp: , Rfl:    hydrochlorothiazide (HYDRODIURIL) 12.5 MG tablet, Take 12.5 mg by mouth daily., Disp: , Rfl:    ibuprofen  (ADVIL ,MOTRIN ) 800 MG tablet, Take 800 mg by mouth every 8 (eight) hours as needed for mild pain or moderate pain. , Disp: , Rfl:    levocetirizine (XYZAL) 5 MG tablet, Take 5 mg by mouth daily., Disp: , Rfl:    lidocaine  (LIDODERM ) 5 %, Place 1 patch onto the skin every 12 (twelve) hours as needed (Back pain). Remove & Discard patch within 12 hours or as directed by MD , Disp: , Rfl:    metoprolol  succinate (TOPROL -XL) 25 MG 24 hr tablet, Take 25 mg by mouth daily., Disp: , Rfl:    metoprolol  tartrate (LOPRESSOR ) 25 MG tablet, Take 1 tablet once daily as needed for palpitations., Disp: 180 tablet, Rfl: 3   omeprazole  (PRILOSEC) 40 MG capsule, Take 1 capsule (40 mg total) by mouth daily., Disp: 90 capsule, Rfl: 3   predniSONE  (DELTASONE ) 20 MG tablet, Take 1.5 tablets (30 mg total) by mouth daily with breakfast. (Patient not taking: No sig reported), Disp: 15 tablet, Rfl: 0   pregabalin (LYRICA) 25 MG capsule, Take 25 mg by mouth 3 (three) times daily., Disp: , Rfl:    QULIPTA 60 MG TABS, Take 1 tablet by mouth daily., Disp:  , Rfl:    sucralfate  (CARAFATE ) 1 GM/10ML suspension, Take 10 mLs (1 g total) by mouth 2 (two) times daily., Disp: 600 mL, Rfl: 1   Tiotropium Bromide (SPIRIVA  RESPIMAT) 2.5 MCG/ACT AERS, Inhale 2 puffs into the lungs daily., Disp: 4 g, Rfl: 12   tobramycin-dexamethasone  (TOBRADEX) ophthalmic solution, INSTILL 1 DROP INTO AFFECTED EYE(S) BY OPHTHALMIC ROUTE BID x 7 days, Disp: , Rfl:    traMADol  (ULTRAM ) 50 MG tablet, Take 50 mg by mouth 3 (three) times daily as needed., Disp: , Rfl:    Ubrogepant (UBRELVY) 100 MG TABS, Take 100 mg by mouth., Disp: , Rfl:    VENTOLIN  HFA 108 (90 Base) MCG/ACT inhaler, Inhale into the lungs., Disp: , Rfl:    Vitamin D, Ergocalciferol, (DRISDOL) 1.25 MG (50000 UNIT) CAPS capsule, Take 50,000 Units by mouth 2 (two) times a week., Disp: , Rfl:  [2]  Social History Tobacco Use  Smoking Status Every Day   Current packs/day: 0.25   Average packs/day: 0.3 packs/day for 10.0 years (2.5 ttl pk-yrs)   Types: Cigarettes  Smokeless Tobacco Never  Tobacco Comments   Has smoked for 20 years, 05/18/24 down to 2 cigarettes daily

## 2024-06-09 ENCOUNTER — Encounter (HOSPITAL_COMMUNITY)
Admission: RE | Admit: 2024-06-09 | Discharge: 2024-06-09 | Disposition: A | Discharge status: Home / Self Care | Source: Ambulatory Visit

## 2024-06-09 DIAGNOSIS — J449 Chronic obstructive pulmonary disease, unspecified: Secondary | ICD-10-CM

## 2024-06-09 NOTE — Progress Notes (Signed)
 Daily Session Note  Patient Details  Name: Anne Olson MRN: [id number] Date of Birth: 29-Sep-1975 Referring Provider:   Conrad Ports Pulmonary Rehab Walk Test from 05/18/2024 in [facility]  Referring Provider Pawar    Encounter Date: 06/09/2024  Check In:  Session Check In - 06/09/24 9182       Check-In   Supervising physician immediately available to respond to emergencies [facility] MD immediately available    Physician(s) Orren Fabry, PA    Location [facility]    Staff Present Ronal Levin, RN, Maud Moats, MS, ACSM-CEP, Exercise Physiologist;Karisha Marlin Midge BS, ACSM-CEP, Exercise Physiologist    Virtual Visit No    Medication changes reported     No    Fall or balance concerns reported    No    Tobacco Cessation No Change    Warm-up and Cool-down Performed as group-led instruction   only   Resistance Training Performed Yes    VAD Patient? No    PAD/SET Patient? No      Pain Assessment   Currently in Pain? No/denies    Pain Score 0-No pain    Multiple Pain Sites No          Capillary Blood Glucose: No results found for this or any previous visit (from the past 24 hours).    Tobacco Use History[1]  Goals Met:  Independence with exercise equipment Exercise tolerated well No report of concerns or symptoms today Strength training completed today  Goals Unmet:  Not Applicable  Comments: Service time is from 0811 to 0930.    Dr. Slater Staff is Medical Director for Pulmonary Rehab at [facility].     [1]  Social History Tobacco Use  Smoking Status Every Day   Current packs/day: 0.25   Average packs/day: 0.3 packs/day for 10.0 years (2.5 ttl pk-yrs)   Types: Cigarettes  Smokeless Tobacco Never  Tobacco Comments   Has smoked for 20 years, 05/18/24 down to 2 cigarettes daily

## 2024-06-16 ENCOUNTER — Encounter (HOSPITAL_COMMUNITY)
Admission: RE | Admit: 2024-06-16 | Discharge: 2024-06-16 | Disposition: A | Discharge status: Home / Self Care | Payer: Self-pay | Source: Ambulatory Visit

## 2024-06-16 VITALS — Wt 125.9 lb

## 2024-06-16 DIAGNOSIS — J449 Chronic obstructive pulmonary disease, unspecified: Secondary | ICD-10-CM | POA: Diagnosis present

## 2024-06-16 NOTE — Progress Notes (Signed)
 Daily Session Note  Patient Details  Name: Anne Olson MRN: [id number] Date of Birth: 04-09-1976 Referring Provider:   Conrad Ports Pulmonary Rehab Walk Test from 05/18/2024 in [facility]  Referring Provider Pawar    Encounter Date: 06/16/2024  Check In:  Session Check In - 06/16/24 0810       Check-In   Supervising physician immediately available to respond to emergencies [facility] MD immediately available    Physician(s) Rosabel Mose, NP    Location [facility]    Staff Present Ronal Levin, RN, Maud Moats, MS, ACSM-CEP, Exercise Physiologist;Casey Claudene, RT    Virtual Visit No    Medication changes reported     No    Fall or balance concerns reported    No    Tobacco Cessation No Change    Warm-up and Cool-down Performed as group-led instruction    Resistance Training Performed Yes    VAD Patient? No    PAD/SET Patient? No      Pain Assessment   Currently in Pain? No/denies    Pain Score 0-No pain    Multiple Pain Sites No          Capillary Blood Glucose: No results found for this or any previous visit (from the past 24 hours).   Exercise Prescription Changes - 06/16/24 0900       Response to Exercise   Blood Pressure (Admit) 120/78    Blood Pressure (Exercise) 140/72    Blood Pressure (Exit) 116/78    Heart Rate (Admit) 95 bpm    Heart Rate (Exercise) 118 bpm    Heart Rate (Exit) 105 bpm    Oxygen Saturation (Admit) 100 %    Oxygen Saturation (Exercise) 98 %    Oxygen Saturation (Exit) 100 %    Rating of Perceived Exertion (Exercise) 8    Perceived Dyspnea (Exercise) 1    Duration Continue with 30 min of aerobic exercise without signs/symptoms of physical distress.    Intensity THRR unchanged      Progression   Progression Continue to progress workloads to maintain intensity without signs/symptoms of physical distress.      Resistance Training   Weight red bands    Reps  10-15    Time 10 Minutes      Recumbant Bike   Level 3    RPM 47    Watts 21    Minutes 15    METs 2.6      NuStep   Level 3    SPM 81    Minutes 15    METs 2.8          Tobacco Use History[1]  Goals Met:  Independence with exercise equipment Exercise tolerated well Queuing for purse lip breathing No report of concerns or symptoms today Strength training completed today  Goals Unmet:  N/A  Comments: Service time is from 0806 to 0920    Dr. Slater Staff is Medical Director for Pulmonary Rehab at [facility].     [1]  Social History Tobacco Use  Smoking Status Every Day   Current packs/day: 0.25   Average packs/day: 0.3 packs/day for 10.0 years (2.5 ttl pk-yrs)   Types: Cigarettes  Smokeless Tobacco Never  Tobacco Comments   Has smoked for 20 years, 05/18/24 down to 2 cigarettes daily

## 2024-06-18 ENCOUNTER — Encounter (HOSPITAL_COMMUNITY)
Admission: RE | Admit: 2024-06-18 | Discharge: 2024-06-18 | Disposition: A | Discharge status: Home / Self Care | Source: Ambulatory Visit

## 2024-06-18 DIAGNOSIS — J449 Chronic obstructive pulmonary disease, unspecified: Secondary | ICD-10-CM | POA: Diagnosis not present

## 2024-06-18 NOTE — Progress Notes (Signed)
 Daily Session Note  Patient Details  Name: Anne Olson MRN: [id number] Date of Birth: 08-Aug-1975 Referring Provider:   Conrad Ports Pulmonary Rehab Walk Test from 05/18/2024 in [facility], [facility]  Referring Provider Pawar    Encounter Date: 06/18/2024  Check In:  Session Check In - 06/18/24 9187       Check-In   Supervising physician immediately available to respond to emergencies [facility] MD immediately available    Physician(s) Damien Braver, NP    Location [facility]    Staff Present Ronal Levin, RN, BSN;Junior Kenedy Claudene, RT;Randi Reeve BS, ACSM-CEP, Exercise Physiologist    Virtual Visit No    Medication changes reported     No    Fall or balance concerns reported    No    Tobacco Cessation No Change    Warm-up and Cool-down Performed as group-led instruction    Resistance Training Performed Yes    VAD Patient? No    PAD/SET Patient? No      Pain Assessment   Currently in Pain? No/denies    Multiple Pain Sites No          Capillary Blood Glucose: No results found for this or any previous visit (from the past 24 hours).    Tobacco Use History[1]  Goals Met:  Proper associated with RPD/PD & O2 Sat Independence with exercise equipment Exercise tolerated well No report of concerns or symptoms today Strength training completed today  Goals Unmet:  Not Applicable  Comments: Service time is from 0814 to 0932.    Dr. Slater Staff is Medical Director for Pulmonary Rehab at [facility].     [1]  Social History Tobacco Use  Smoking Status Every Day   Current packs/day: 0.25   Average packs/day: 0.3 packs/day for 10.0 years (2.5 ttl pk-yrs)   Types: Cigarettes  Smokeless Tobacco Never  Tobacco Comments   Has smoked for 20 years, 05/18/24 down to 2 cigarettes daily

## 2024-06-23 ENCOUNTER — Encounter (HOSPITAL_COMMUNITY): Admission: RE | Admit: 2024-06-23 | Discharge: 2024-06-23 | Disposition: A | Source: Ambulatory Visit

## 2024-06-23 DIAGNOSIS — J449 Chronic obstructive pulmonary disease, unspecified: Secondary | ICD-10-CM

## 2024-06-23 NOTE — Progress Notes (Signed)
 Daily Session Note  Patient Details  Name: Anne Olson MRN: [id number] Date of Birth: 02/11/1976 Referring Provider:   Conrad Ports Pulmonary Rehab Walk Test from 05/18/2024 in [facility], [facility], & Lung [facility]  Referring Provider Pawar    Encounter Date: 06/23/2024  Check In:  Session Check In - 06/23/24 0813       Check-In   Supervising physician immediately available to respond to emergencies [facility] immediately available    Physician(s) Rosaline Bane, NP    Location [facility]    Staff Present Ronal Levin, RN, BSN;Dmetrius Ambs Claudene Neita Moats, MS, ACSM-CEP, Exercise Physiologist    Virtual Visit No    Medication changes reported     No    Fall or balance concerns reported    No    Tobacco Cessation No Change    Warm-up and Cool-down Performed as group-led instruction    Resistance Training Performed Yes    VAD Patient? No    PAD/SET Patient? No      Pain Assessment   Currently in Pain? No/denies    Pain Score 0-No pain    Multiple Pain Sites No          Capillary Blood Glucose: No results found for this or any previous visit (from the past 24 hours).    Tobacco Use History[1]  Goals Met:  Proper associated with RPD/PD & O2 Sat Independence with exercise equipment Exercise tolerated well No report of concerns or symptoms today Strength training completed today  Goals Unmet:  Not Applicable  Comments: Service time is from 0808 to 0935.    Dr. Slater Staff is Medical Director for Pulmonary Rehab at [facility].     [1]  Social History Tobacco Use  Smoking Status Every Day   Current packs/day: 0.25   Average packs/day: 0.3 packs/day for 10.0 years (2.5 ttl pk-yrs)   Types: Cigarettes  Smokeless Tobacco Never  Tobacco Comments   Has smoked for 20 years, 05/18/24 down to 2 cigarettes daily

## 2024-06-24 ENCOUNTER — Telehealth: Payer: Self-pay | Admitting: Gastroenterology

## 2024-06-24 NOTE — Telephone Encounter (Signed)
 Spoke with the pt and discussed that per Dr Melba procedure report the carafate  was for 1 month.  She has also been advised to try Pepcid at bedtime and call back If the bloating is no better.  The pt has been advised of the information and verbalized understanding.

## 2024-06-24 NOTE — Telephone Encounter (Signed)
 Inbound call from patient stating that she is needing to know if she should continue taking her carafate  10 MG after her 30 days. Patient is also wanting to know if she can be added another medication or get a higher dosage on her Prilosec 40 MG due to her continuing to have bloating, nausea and feeling overly full. Patient is requesting a call back. Please advise.

## 2024-06-25 ENCOUNTER — Encounter (HOSPITAL_COMMUNITY): Admission: RE | Admit: 2024-06-25 | Discharge: 2024-06-25 | Disposition: A | Payer: Self-pay | Source: Ambulatory Visit

## 2024-06-25 DIAGNOSIS — J449 Chronic obstructive pulmonary disease, unspecified: Secondary | ICD-10-CM

## 2024-06-25 NOTE — Progress Notes (Signed)
 Daily Session Note  Patient Details  Name: Anne Olson MRN: [id number] Date of Birth: [birth year] Referring Provider:   Conrad Ports Pulmonary Rehab Walk Test from 05/18/2024 in [facility]  Referring Provider Pawar    Encounter Date: 06/25/2024  Check In:  Session Check In - 06/25/24 9177       Check-In   Supervising physician immediately available to respond to emergencies [facility] MD immediately available    Physician(s) Lum Louis, NP    Location [facility]    Staff Present Ronal Levin, RN, BSN;Daemon Dowty Claudene Neita Moats, MS, ACSM-CEP, Exercise Physiologist    Virtual Visit No    Medication changes reported     No    Fall or balance concerns reported    No    Tobacco Cessation No Change    Warm-up and Cool-down Performed as group-led instruction    Resistance Training Performed Yes    VAD Patient? No    PAD/SET Patient? No      Pain Assessment   Currently in Pain? No/denies    Pain Score 0-No pain    Multiple Pain Sites No          Capillary Blood Glucose: No results found for this or any previous visit (from the past 24 hours).    Tobacco Use History[1]  Goals Met:  Proper associated with RPD/PD & O2 Sat Independence with exercise equipment Exercise tolerated well No report of concerns or symptoms today Strength training completed today  Goals Unmet:  Not Applicable  Comments: Service time is from 0813 to 0926.    Dr. Slater Staff is Medical Director for Pulmonary Rehab at [facility].     [1]  Social History Tobacco Use  Smoking Status Every Day   Current packs/day: 0.25   Average packs/day: 0.3 packs/day for 10.0 years (2.5 ttl pk-yrs)   Types: Cigarettes  Smokeless Tobacco Never  Tobacco Comments   Has smoked for 20 years, 05/18/24 down to 2 cigarettes daily

## 2024-06-30 ENCOUNTER — Encounter (HOSPITAL_COMMUNITY): Admission: RE | Admit: 2024-06-30 | Discharge: 2024-06-30 | Disposition: A | Payer: Self-pay | Source: Ambulatory Visit

## 2024-06-30 VITALS — Wt 127.2 lb

## 2024-06-30 DIAGNOSIS — J449 Chronic obstructive pulmonary disease, unspecified: Secondary | ICD-10-CM | POA: Diagnosis not present

## 2024-06-30 NOTE — Progress Notes (Signed)
 Daily Session Note  Patient Details  Name: Anne Olson MRN: [id number] Date of Birth: 08-21-1975 Referring Provider:   Conrad Ports [facility] Re[facility] Walk Test from 05/18/2024 in [facility]  Referring Provider Pawar    Encounter Date: 06/30/2024  Check In:  Session Check In - 06/30/24 0813       Check-In   Supervising physician immediately available to respond to emergencies [facility] MD immediately available    Physician(s) Lum Louis, NP    Location [facility]    Staff Present Ronal Levin, RN, BSN;Xenia Nile Claudene Neita Moats, MS, ACSM-CEP, Exercise Physiologist    Virtual Visit No    Medication changes reported     No    Fall or balance concerns reported    No    Tobacco Cessation No Change    Warm-up and Cool-down Performed as group-led instruction    Resistance Training Performed Yes    VAD Patient? No    PAD/SET Patient? No      Pain Assessment   Currently in Pain? No/denies    Multiple Pain Sites No          Capillary Blood Glucose: No results found for this or any previous visit (from the past 24 hours).   Exercise Prescription Changes - 06/30/24 0900       Response to Exercise   Blood Pressure (Admit) 138/82    Blood Pressure (Exercise) 160/90    Blood Pressure (Exit) 120/60    Heart Rate (Admit) 120 bpm    Heart Rate (Exercise) 125 bpm    Heart Rate (Exit) 106 bpm    Oxygen Saturation (Admit) 96 %    Oxygen Saturation (Exercise) 97 %    Oxygen Saturation (Exit) 97 %    Rating of Perceived Exertion (Exercise) 10    Perceived Dyspnea (Exercise) 1    Duration Continue with 30 min of aerobic exercise without signs/symptoms of physical distress.    Intensity THRR unchanged      Progression   Progression Continue to progress workloads to maintain intensity without signs/symptoms of physical distress.      Resistance Training   Weight red bands    Reps 10-15    Time 10  Minutes      Recumbant Bike   Level 3    RPM 46    Watts 24    Minutes 15    METs 2.7      NuStep   Level 4    SPM 78    Minutes 15    METs 3.2          Tobacco Use History[1]  Goals Met:  Proper associated with RPD/PD & O2 Sat Independence with exercise equipment Exercise tolerated well No report of concerns or symptoms today Strength training completed today  Goals Unmet:  Not Applicable  Comments: Service time is from 0810 to 0921.    Dr. Slater Staff is Medical Director for Pulmonary Rehab at [facility].     [1]  Social History Tobacco Use  Smoking Status Every Day   Current packs/day: 0.25   Average packs/day: 0.3 packs/day for 10.0 years (2.5 ttl pk-yrs)   Types: Cigarettes  Smokeless Tobacco Never  Tobacco Comments   Has smoked for 20 years, 05/18/24 down to 2 cigarettes daily

## 2024-07-01 ENCOUNTER — Ambulatory Visit: Payer: Self-pay | Admitting: Pulmonary Disease

## 2024-07-01 NOTE — Telephone Encounter (Signed)
 First attempt to contact pt for triage, no answer both times, LVM for call back to pulm office.

## 2024-07-01 NOTE — Telephone Encounter (Signed)
 Dr. Zaida, Please advise.  Thank you.

## 2024-07-01 NOTE — Progress Notes (Signed)
 Pulmonary Individual Treatment Plan  Patient Details  Name: Anne Olson MRN: [id number] Date of Birth: 1975-11-22 Referring Provider:   Conrad Ports Pulmonary Rehab Walk Test from 05/18/2024 in [facility]  Referring Provider Pawar    Initial Encounter Date:  Flowsheet Row Pulmonary Rehab Walk Test from 05/18/2024 in [facility]  Date 05/18/24    Visit Diagnosis: Stage 3 severe COPD by GOLD classification (HCC)  Patient's Home Medications on Admission:  Current Medications[1]  Past Medical History: Past Medical History:  Diagnosis Date   Anemia    iron supplements in the past   Anxiety    has been prescribed Xanax   Asthma    has albuterol , advair prn, no attack x 2 years;triggered by strong smells (bleach and smoke)   Benign breast cyst in female 1995   had bx done was normal   Chronic kidney disease    COPD (chronic obstructive pulmonary disease) (HCC)    DUB (dysfunctional uterine bleeding) 1995   Family hx cleft lip 04/29/2012   FOB's son    GERD (gastroesophageal reflux disease)    Gestational Hypertension 07/17/2012   Glaucoma    Headache(784.0)    during and prior to pregnancy;usually Rt side of head;can effect vision   Heart murmur    Hx of preeclampsia, prior pregnancy, currently pregnant 03/12/2012   Induced at 38wks, no meds     Hypertension    Infection    Yeast inf;gets freq w/ antibxs or scented soaps   Infection    BV;recently treated for BV completed Flagyl    Memory loss 2013   Currently having difficult time remembering things   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   Ovarian cyst in pregnancy 2013   seen on recent US    Preeclampsia 1994   induced @ 38 weeks   Scoliosis     Tobacco Use: Tobacco Use History[2]  Labs: Review Flowsheet       11/24/2007 04/05/2008  Labs for ITP Cardiac and Pulmonary  Rehab  TCO2 23  24     Capillary Blood Glucose: No results found for: GLUCAP   Pulmonary Assessment Scores:  Pulmonary Assessment Scores     Row Name 05/18/24 0929         ADL UCSD   ADL Phase Entry     SOB Score total 54       CAT Score   CAT Score 30       mMRC Score   mMRC Score 3       UCSD: Self-administered rating of dyspnea associated with activities of daily living (ADLs) 6-point scale (0 = not at all to 5 = maximal or unable to do because of breathlessness)  Scoring Scores range from 0 to 120.  Minimally important difference is 5 units  CAT: CAT can identify the health impairment of COPD patients and is better correlated with disease progression.  CAT has a scoring range of zero to 40. The CAT score is classified into four groups of low (less than 10), medium (10 - 20), high (21-30) and very high (31-40) based on the impact level of disease on health status. A CAT score over 10 suggests significant symptoms.  A worsening CAT score could be explained by an exacerbation, poor medication adherence, poor inhaler technique, or progression of COPD or comorbid conditions.  CAT MCID is 2  points  mMRC: mMRC (Modified Medical Research Council) Dyspnea Scale is used to assess the degree of baseline functional disability in patients of respiratory disease due to dyspnea. No minimal important difference is established. A decrease in score of 1 point or greater is considered a positive change.   Pulmonary Function Assessment:  Pulmonary Function Assessment - 05/18/24 0929       Breath   Bilateral Breath Sounds Clear    Shortness of Breath Yes;Limiting activity          Exercise Target Goals: Exercise Program Goal: Individual exercise prescription set using results from initial 6 min walk test and THRR while considering  patients activity barriers and safety.   Exercise Prescription Goal: Initial exercise prescription builds to 30-45 minutes a day of aerobic  activity, 2-3 days per week.  Home exercise guidelines will be given to patient during program as part of exercise prescription that the participant will acknowledge.  Activity Barriers & Risk Stratification:  Activity Barriers & Cardiac Risk Stratification - 05/18/24 0926       Activity Barriers & Cardiac Risk Stratification   Activity Barriers Deconditioning;Muscular Weakness;Shortness of Breath;Back Problems;Other (comment)    Comments b/l foot pain          6 Minute Walk:  6 Minute Walk     Row Name 05/18/24 1018         6 Minute Walk   Phase Initial     Distance 1145 feet     Walk Time 6 minutes     # of Rest Breaks 0     MPH 2.17     METS 4.82     RPE 12     Perceived Dyspnea  1     VO2 Peak 16.88     Symptoms No     Resting HR 113 bpm     Resting BP 128/80     Resting Oxygen Saturation  99 %     Exercise Oxygen Saturation  during 6 min walk 98 %     Max Ex. HR 134 bpm     Max Ex. BP 142/84     2 Minute Post BP 128/80       Interval Oxygen   Interval Oxygen? Yes     Baseline Oxygen Saturation % 99 %     1 Minute Oxygen Saturation % 100 %     1 Minute Liters of Oxygen 0 L     2 Minute Oxygen Saturation % 99 %     2 Minute Liters of Oxygen 0 L     3 Minute Oxygen Saturation % 99 %     3 Minute Liters of Oxygen 0 L     4 Minute Oxygen Saturation % 98 %     4 Minute Liters of Oxygen 0 L     5 Minute Oxygen Saturation % 98 %     5 Minute Liters of Oxygen 0 L     6 Minute Oxygen Saturation % 100 %     6 Minute Liters of Oxygen 0 L     2 Minute Post Oxygen Saturation % 100 %     2 Minute Post Liters of Oxygen 0 L        Oxygen Initial Assessment:  Oxygen Initial Assessment - 05/18/24 0927       Home Oxygen   Home Oxygen Device None    Sleep Oxygen Prescription None    Home Exercise Oxygen Prescription None  Home Resting Oxygen Prescription None      Initial 6 min Walk   Oxygen Used None      Program Oxygen Prescription   Program Oxygen  Prescription None      Intervention   Short Term Goals To learn and understand importance of maintaining oxygen saturations>88%;To learn and understand importance of monitoring SPO2 with pulse oximeter and demonstrate accurate use of the pulse oximeter.;To learn and demonstrate proper pursed lip breathing techniques or other breathing techniques. ;To learn and demonstrate proper use of respiratory medications    Long  Term Goals Maintenance of O2 saturations>88%;Compliance with respiratory medication;Verbalizes importance of monitoring SPO2 with pulse oximeter and return demonstration;Exhibits proper breathing techniques, such as pursed lip breathing or other method taught during program session;Demonstrates proper use of MDIs          Oxygen Re-Evaluation:  Oxygen Re-Evaluation     Row Name 05/21/24 1632 06/22/24 0702           Program Oxygen Prescription   Program Oxygen Prescription None None        Home Oxygen   Home Oxygen Device None None      Sleep Oxygen Prescription None None      Home Exercise Oxygen Prescription None None      Home Resting Oxygen Prescription None None        Goals/Expected Outcomes   Short Term Goals To learn and understand importance of maintaining oxygen saturations>88%;To learn and understand importance of monitoring SPO2 with pulse oximeter and demonstrate accurate use of the pulse oximeter.;To learn and demonstrate proper pursed lip breathing techniques or other breathing techniques. ;To learn and demonstrate proper use of respiratory medications To learn and understand importance of maintaining oxygen saturations>88%;To learn and understand importance of monitoring SPO2 with pulse oximeter and demonstrate accurate use of the pulse oximeter.;To learn and demonstrate proper pursed lip breathing techniques or other breathing techniques. ;To learn and demonstrate proper use of respiratory medications      Long  Term Goals Maintenance of O2  saturations>88%;Compliance with respiratory medication;Verbalizes importance of monitoring SPO2 with pulse oximeter and return demonstration;Exhibits proper breathing techniques, such as pursed lip breathing or other method taught during program session;Demonstrates proper use of MDIs Maintenance of O2 saturations>88%;Compliance with respiratory medication;Verbalizes importance of monitoring SPO2 with pulse oximeter and return demonstration;Exhibits proper breathing techniques, such as pursed lip breathing or other method taught during program session;Demonstrates proper use of MDIs      Goals/Expected Outcomes Compliance and understanding of oxygen saturation monitoring and breathing techniques to decrease shortness of breath. Compliance and understanding of oxygen saturation monitoring and breathing techniques to decrease shortness of breath.         Oxygen Discharge (Final Oxygen Re-Evaluation):  Oxygen Re-Evaluation - 06/22/24 0702       Program Oxygen Prescription   Program Oxygen Prescription None      Home Oxygen   Home Oxygen Device None    Sleep Oxygen Prescription None    Home Exercise Oxygen Prescription None    Home Resting Oxygen Prescription None      Goals/Expected Outcomes   Short Term Goals To learn and understand importance of maintaining oxygen saturations>88%;To learn and understand importance of monitoring SPO2 with pulse oximeter and demonstrate accurate use of the pulse oximeter.;To learn and demonstrate proper pursed lip breathing techniques or other breathing techniques. ;To learn and demonstrate proper use of respiratory medications    Long  Term Goals Maintenance of O2 saturations>88%;Compliance with respiratory  medication;Verbalizes importance of monitoring SPO2 with pulse oximeter and return demonstration;Exhibits proper breathing techniques, such as pursed lip breathing or other method taught during program session;Demonstrates proper use of MDIs     Goals/Expected Outcomes Compliance and understanding of oxygen saturation monitoring and breathing techniques to decrease shortness of breath.          Initial Exercise Prescription:  Initial Exercise Prescription - 05/18/24 1000       Date of Initial Exercise RX and Referring Provider   Date 05/18/24    Referring Provider Pawar    Expected Discharge Date 08/21/23      Recumbant Bike   Level 1    RPM 30    Watts 5    Minutes 15    METs 1.5      NuStep   Level 1    SPM 34    Minutes 15    METs 1.4      Prescription Details   Frequency (times per week) 2    Duration Progress to 30 minutes of continuous aerobic without signs/symptoms of physical distress      Intensity   THRR 40-80% of Max Heartrate 69-138    Ratings of Perceived Exertion 11-13    Perceived Dyspnea 0-4      Progression   Progression Continue to progress workloads to maintain intensity without signs/symptoms of physical distress.      Resistance Training   Training Prescription Yes    Weight red bands    Reps 10-15          Perform Capillary Blood Glucose checks as needed.  Exercise Prescription Changes:   Exercise Prescription Changes     Row Name 06/02/24 0900 06/16/24 0900 06/30/24 0900         Response to Exercise   Blood Pressure (Admit) 142/86 120/78 138/82     Blood Pressure (Exercise) 136/72 140/72 160/90     Blood Pressure (Exit) 118/82 116/78 120/60     Heart Rate (Admit) 121 bpm 95 bpm 120 bpm     Heart Rate (Exercise) 117 bpm 118 bpm 125 bpm     Heart Rate (Exit) 112 bpm 105 bpm 106 bpm     Oxygen Saturation (Admit) 100 % 100 % 96 %     Oxygen Saturation (Exercise) 98 % 98 % 97 %     Oxygen Saturation (Exit) 99 % 100 % 97 %     Rating of Perceived Exertion (Exercise) 8 8 10      Perceived Dyspnea (Exercise) 1 1 1      Duration Continue with 30 min of aerobic exercise without signs/symptoms of physical distress. Continue with 30 min of aerobic exercise without signs/symptoms  of physical distress. Continue with 30 min of aerobic exercise without signs/symptoms of physical distress.     Intensity THRR unchanged THRR unchanged THRR unchanged       Progression   Progression Continue to progress workloads to maintain intensity without signs/symptoms of physical distress. Continue to progress workloads to maintain intensity without signs/symptoms of physical distress. Continue to progress workloads to maintain intensity without signs/symptoms of physical distress.       Resistance Training   Weight red bands red bands red bands     Reps 10-15 10-15 10-15     Time 10 Minutes 10 Minutes 10 Minutes       Recumbant Bike   Level 1 3 3      RPM 43 47 46     Watts 9 21 24  Minutes 15 15 15      METs 1.7 2.6 2.7       NuStep   Level 3 3 4      SPM 71 81 78     Minutes 15 15 15      METs 2.4 2.8 3.2        Exercise Comments:   Exercise Goals and Review:   Exercise Goals     Row Name 05/18/24 0927             Exercise Goals   Increase Physical Activity Yes       Intervention Provide advice, education, support and counseling about physical activity/exercise needs.;Develop an individualized exercise prescription for aerobic and resistive training based on initial evaluation findings, risk stratification, comorbidities and participant's personal goals.       Expected Outcomes Short Term: Attend rehab on a regular basis to increase amount of physical activity.;Long Term: Exercising regularly at least 3-5 days a week.;Long Term: Add in home exercise to make exercise part of routine and to increase amount of physical activity.       Increase Strength and Stamina Yes       Intervention Provide advice, education, support and counseling about physical activity/exercise needs.;Develop an individualized exercise prescription for aerobic and resistive training based on initial evaluation findings, risk stratification, comorbidities and participant's personal goals.        Expected Outcomes Short Term: Increase workloads from initial exercise prescription for resistance, speed, and METs.;Short Term: Perform resistance training exercises routinely during rehab and add in resistance training at home;Long Term: Improve cardiorespiratory fitness, muscular endurance and strength as measured by increased METs and functional capacity ( )       Able to understand and use rate of perceived exertion (RPE) scale Yes       Intervention Provide education and explanation on how to use RPE scale       Expected Outcomes Short Term: Able to use RPE daily in rehab to express subjective intensity level;Long Term:  Able to use RPE to guide intensity level when exercising independently       Able to understand and use Dyspnea scale Yes       Intervention Provide education and explanation on how to use Dyspnea scale       Expected Outcomes Short Term: Able to use Dyspnea scale daily in rehab to express subjective sense of shortness of breath during exertion;Long Term: Able to use Dyspnea scale to guide intensity level when exercising independently       Knowledge and understanding of Target Heart Rate Range (THRR) Yes       Intervention Provide education and explanation of THRR including how the numbers were predicted and where they are located for reference       Expected Outcomes Short Term: Able to state/look up THRR;Short Term: Able to use daily as guideline for intensity in rehab;Long Term: Able to use THRR to govern intensity when exercising independently       Understanding of Exercise Prescription Yes       Intervention Provide education, explanation, and written materials on patient's individual exercise prescription       Expected Outcomes Short Term: Able to explain program exercise prescription;Long Term: Able to explain home exercise prescription to exercise independently          Exercise Goals Re-Evaluation :  Exercise Goals Re-Evaluation     Row Name 05/21/24 [id number]  06/22/24 9297  Exercise Goal Re-Evaluation   Exercise Goals Review Increase Physical Activity;Able to understand and use Dyspnea scale;Understanding of Exercise Prescription;Increase Strength and Stamina;Knowledge and understanding of Target Heart Rate Range (THRR);Able to understand and use rate of perceived exertion (RPE) scale Increase Physical Activity;Able to understand and use Dyspnea scale;Understanding of Exercise Prescription;Increase Strength and Stamina;Knowledge and understanding of Target Heart Rate Range (THRR);Able to understand and use rate of perceived exertion (RPE) scale      Comments Louvenia is scheduled to begin exercise on 12/16. Will continue to monitor and progress as able. Mabell has completed 6 exercise sessions. She exercises for 15 min on the recumbent bike and Nustep. She averages 2.1 METs at level 3 on the recumbent bike and 2.3 METs at level 4 on the Nustep. She performs the warmup and cooldown standing without limitations. Fallan has increased her level for the recumbent bike and Nustep with increases in METs. She is making great progress thus far.      Expected Outcomes Through exercise at rehab and home, the patient will decrease shortness of breath with daily activities and feel confident in carrying out an exercise regimen at home Through exercise at rehab and home, the patient will decrease shortness of breath with daily activities and feel confident in carrying out an exercise regimen at home         Discharge Exercise Prescription (Final Exercise Prescription Changes):  Exercise Prescription Changes - 06/30/24 0900       Response to Exercise   Blood Pressure (Admit) 138/82    Blood Pressure (Exercise) 160/90    Blood Pressure (Exit) 120/60    Heart Rate (Admit) 120 bpm    Heart Rate (Exercise) 125 bpm    Heart Rate (Exit) 106 bpm    Oxygen Saturation (Admit) 96 %    Oxygen Saturation (Exercise) 97 %    Oxygen Saturation (Exit) 97 %    Rating  of Perceived Exertion (Exercise) 10    Perceived Dyspnea (Exercise) 1    Duration Continue with 30 min of aerobic exercise without signs/symptoms of physical distress.    Intensity THRR unchanged      Progression   Progression Continue to progress workloads to maintain intensity without signs/symptoms of physical distress.      Resistance Training   Weight red bands    Reps 10-15    Time 10 Minutes      Recumbant Bike   Level 3    RPM 46    Watts 24    Minutes 15    METs 2.7      NuStep   Level 4    SPM 78    Minutes 15    METs 3.2          Nutrition:  Target Goals: Understanding of nutrition guidelines, daily intake of sodium 1500mg , cholesterol 200mg , calories 30% from fat and 7% or less from saturated fats, daily to have 5 or more servings of fruits and vegetables.  Biometrics:  Pre Biometrics - 05/18/24 0915       Pre Biometrics   Grip Strength 10 kg           Nutrition Therapy Plan and Nutrition Goals:  Nutrition Therapy & Goals - 05/26/24 0942       Nutrition Therapy   Diet Regular diet      Personal Nutrition Goals   Nutrition Goal Patient to identify strategies for weight gain of 0.5-2 # per week.    Comments Patient with medical history  significant for COPD. Reports difficulty gaining weight due to ongoing GI issues; pt reports experiencing nausea, bloating, abdominal discomfort with food intake. Current BMI 18.7 kg/m2 (low end of ideal range) based on today's weight of 124.8 lb. Pt endorses minimal intake on some days due to GI symptoms; scheduled for endoscopy tomorrow. RD encouraged small, frequent meals/snacks composed of high calorie/high protein foods. Also discussed supplementing diet with high calorie oral nutrition supplements such as Equate Plus. Patient will benefit from participation in pulmonary rehab for nutrition education, exercise, and lifestyle modification.      Intervention Plan   Intervention Prescribe, educate and counsel  regarding individualized specific dietary modifications aiming towards targeted core components such as weight, hypertension, lipid management, diabetes, heart failure and other comorbidities.;Nutrition handout(s) given to patient.   Handouts: Weight Gain   Expected Outcomes Short Term Goal: Understand basic principles of dietary content, such as calories, fat, sodium, cholesterol and nutrients.;Long Term Goal: Adherence to prescribed nutrition plan.          Nutrition Assessments:  MEDIFICTS Score Key: >=70 Need to make dietary changes  40-70 Heart Healthy Diet <= 40 Therapeutic Level Cholesterol Diet   Picture Your Plate Scores: <59 Unhealthy dietary pattern with much room for improvement. 41-50 Dietary pattern unlikely to meet recommendations for good health and room for improvement. 51-60 More healthful dietary pattern, with some room for improvement.  >60 Healthy dietary pattern, although there may be some specific behaviors that could be improved.    Nutrition Goals Re-Evaluation:  Nutrition Goals Re-Evaluation     Row Name 06/23/24 0940             Goals   Current Weight 126 lb 1.6 oz (57.2 kg)       Nutrition Goal Patient to identify strategies for weight gain of 0.5-2 # per week.       Comment Stable wt over past month.       Expected Outcome Goal in action. Patient with medical history significant for COPD. Pt continues to reports difficulty gaining weight due to ongoing GI issues; pt reports experiencing nausea, bloating, abdominal discomfort with food intake. Also complains of early satiety. Recent upper endoscopy indicates presence of hiatal hernia. Pt currently supplementing diet with [employer] Breakfast mixed with milk/ice cream. Snacks typically consist of fruit, sometimes with cottage cheese. RD continued to encourage small, frequent meals including high calorie/high protein foods as tolerated. Also discussed switching to high calorie, higher protein oral  nutrition supplement such as Equate Plus. Patient will benefit from participation in pulmonary rehab for nutrition education, exercise, and lifestyle modification.          Nutrition Goals Discharge (Final Nutrition Goals Re-Evaluation):  Nutrition Goals Re-Evaluation - 06/23/24 0940       Goals   Current Weight 126 lb 1.6 oz (57.2 kg)    Nutrition Goal Patient to identify strategies for weight gain of 0.5-2 # per week.    Comment Stable wt over past month.    Expected Outcome Goal in action. Patient with medical history significant for COPD. Pt continues to reports difficulty gaining weight due to ongoing GI issues; pt reports experiencing nausea, bloating, abdominal discomfort with food intake. Also complains of early satiety. Recent upper endoscopy indicates presence of hiatal hernia. Pt currently supplementing diet with [employer] Breakfast mixed with milk/ice cream. Snacks typically consist of fruit, sometimes with cottage cheese. RD continued to encourage small, frequent meals including high calorie/high protein foods as tolerated. Also discussed switching  to high calorie, higher protein oral nutrition supplement such as Equate Plus. Patient will benefit from participation in pulmonary rehab for nutrition education, exercise, and lifestyle modification.          Psychosocial: Target Goals: Acknowledge presence or absence of significant depression and/or stress, maximize coping skills, provide positive support system. Participant is able to verbalize types and ability to use techniques and skills needed for reducing stress and depression.  Initial Review & Psychosocial Screening:  Initial Psych Review & Screening - 05/18/24 0933       Initial Review   Current issues with Current Anxiety/Panic;Current Psychotropic Meds;Current Sleep Concerns      Family Dynamics   Good Support System? Yes    Comments Pt stated she has good support at home from her wife, daughter and niece.  She states she has sleep disturbances and states she can't fall asleep or stay asleep due to leg cramps. Iara states that she has recently been started on Lyrica and Cymbalta for chronic pain and neuropathy. She states she has been doing family therapy after the passing of her dad, who she was his primary caregiver. Harleyquinn states her anxiety has been stable, but she still gets heart palpitations and takes meds as needed. She denies any needs, resources or referrals at this time.      Barriers   Psychosocial barriers to participate in program Psychosocial barriers identified (see note)      Screening Interventions   Interventions Encouraged to exercise;Provide feedback about the scores to participant    Expected Outcomes Short Term goal: Utilizing psychosocial counselor, staff and physician to assist with identification of specific Stressors or current issues interfering with healing process. Setting desired goal for each stressor or current issue identified.;Long Term Goal: Stressors or current issues are controlled or eliminated.;Short Term goal: Identification and review with participant of any Quality of Life or Depression concerns found by scoring the questionnaire.;Long Term goal: The participant improves quality of Life and PHQ9 Scores as seen by post scores and/or verbalization of changes          Quality of Life Scores:  Scores of 19 and below usually indicate a poorer quality of life in these areas.  A difference of  2-3 points is a clinically meaningful difference.  A difference of 2-3 points in the total score of the Quality of Life Index has been associated with significant improvement in overall quality of life, self-image, physical symptoms, and general health in studies assessing change in quality of life.  PHQ-9: Review Flowsheet       05/18/2024 09/08/2016 06/07/2016  Depression screen PHQ 2/9  Decreased Interest 0 0 0  Down, Depressed, Hopeless 0 0 0  PHQ - 2 Score 0 0 0   Altered sleeping 3 - -  Tired, decreased energy 0 - -  Change in appetite 0 - -  Feeling bad or failure about yourself  0 - -  Trouble concentrating 0 - -  Moving slowly or fidgety/restless 0 - -  Suicidal thoughts 0 - -  PHQ-9 Score 3 - -  Difficult doing work/chores Not difficult at all - -   Interpretation of Total Score  Total Score Depression Severity:  1-4 = Minimal depression, 5-9 = Mild depression, 10-14 = Moderate depression, 15-19 = Moderately severe depression, 20-27 = Severe depression   Psychosocial Evaluation and Intervention:  Psychosocial Evaluation - 06/23/24 0900       Psychosocial Evaluation & Interventions   Interventions --  Comments --    Expected Outcomes --    Continue Psychosocial Services  --          Psychosocial Re-Evaluation:  Psychosocial Re-Evaluation     Row Name 05/22/24 [id number] 06/23/24 0853           Psychosocial Re-Evaluation   Current issues with Current Anxiety/Panic;Current Psychotropic Meds;Current Sleep Concerns Current Anxiety/Panic;Current Psychotropic Meds;Current Sleep Concerns      Comments Chyler is scheduled to start PR on 05/26/24. No new barriers or concerns since orientation. 30 day psy/soc re-eval: Corianna still endorses poor sleep. She states she can fall asleep but wakes up due to coughing with phlegm and leg and foot cramps. She is working with her pulmonologist about the night time coughing. She states she has informed her PCP about the leg cramps but they have not found a resolution yet. Corrisa endorses concern about her heart. She recently had an abnormal EKG. The cardiologist has now ordered her a Zio heart monitor, echocardiogram, and stress test. She states she has a heart murmur and tachycardia but is worried that it has gotten worse. Renell states she has good support from her wife and daughter. She sees a family therapist and I encouraged her to also see the therapist individually for her health-related anxiety  and stress. Maciel has cut back on smoking and is now down to 1 cigarette/day. 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 She denies any additional needs, concerns or referrals at this time.      Expected Outcomes For Francyne to participate in PR with no psy/soc barriers or concerns For Brena to reduce stress, get better sleep and participate in PR without any other barriers or concerns      Interventions Encouraged to attend Pulmonary Rehabilitation for the exercise Encouraged to attend Pulmonary Rehabilitation for the exercise      Continue Psychosocial Services  Follow up required by staff Follow up required by staff         Psychosocial Discharge (Final Psychosocial Re-Evaluation):  Psychosocial Re-Evaluation - 06/23/24 0853       Psychosocial Re-Evaluation   Current issues with Current Anxiety/Panic;Current Psychotropic Meds;Current Sleep Concerns    Comments 30 day psy/soc re-eval: Jaydalyn still endorses poor sleep. She states she can fall asleep but wakes up due to coughing with phlegm and leg and foot cramps. She is working with her pulmonologist about the night time coughing. She states she has informed her PCP about the leg cramps but they have not found a resolution yet. Laasia endorses concern about her heart. She recently had an abnormal EKG. The cardiologist has now ordered her a Zio heart monitor, echocardiogram, and stress test. She states she has a heart murmur and tachycardia but is worried that it has gotten worse. Amariss states she has good support from her wife and daughter. She sees a family therapist and I encouraged her to also see the therapist individually for her health-related anxiety and stress. Corbin has cut back on smoking and is now down to 1 cigarette/day. 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 She denies any additional needs, concerns or referrals at this time.    Expected Outcomes For Hampton to reduce stress, get  better sleep and participate in PR without any other barriers or concerns    Interventions Encouraged to attend Pulmonary Rehabilitation for the exercise    Continue Psychosocial Services  Follow up required by staff  Education: Education Goals: Education classes will be provided on a weekly basis, covering required topics. Participant will state understanding/return demonstration of topics presented.  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 Instructor discusses importance of knowing and understanding resting, exercise, and post-exercise oxygen saturation, heart rate, and blood pressure. Oxygen saturation, heart rate, blood pressure, rating of perceived exertion, and dyspnea are reviewed along with a normal range for these values.  Flowsheet Row PULMONARY REHAB CHRONIC OBSTRUCTIVE PULMONARY DISEASE from 05/28/2024 in [facility] for Heart, Vascular, & Lung Health  Date 05/28/24  Educator EP  Instruction Review Code 1- Verbalizes Understanding    Exercise for the Pulmonary Patient Group instruction that is supported by a PowerPoint presentation. Instructor discusses benefits of exercise, core components of exercise, frequency, duration, and intensity of an exercise routine, importance of utilizing pulse oximetry during exercise, safety while exercising, and options of places to exercise outside of rehab.    MET Level  Group instruction provided by PowerPoint, verbal discussion, and written material to support subject matter. Instructor reviews what METs are and how to increase METs.    Pulmonary Medications Verbally interactive group education provided by instructor with focus on inhaled  medications and proper administration.   Anatomy and Physiology of the Respiratory System Group instruction provided by PowerPoint, verbal discussion, and written material to support subject matter. Instructor reviews respiratory cycle and anatomical components of the respiratory system and their functions. Instructor also reviews differences in obstructive and restrictive respiratory diseases with examples of each.    Oxygen Safety Group instruction provided by PowerPoint, verbal discussion, and written material to support subject matter. There is an overview of What is Oxygen and Why do we need it.  Instructor also reviews how to create a safe environment for oxygen use, the importance of using oxygen as prescribed, and the risks of noncompliance. There is a brief discussion on traveling with oxygen and resources the patient may utilize. Flowsheet Row PULMONARY REHAB CHRONIC OBSTRUCTIVE PULMONARY DISEASE from 06/18/2024 in [facility] for Heart, Vascular, & Lung Health  Date 06/18/24  Educator RN  Instruction Review Code 1- Verbalizes Understanding    Oxygen Use Group instruction provided by PowerPoint, verbal discussion, and written material to discuss how supplemental oxygen is prescribed and different types of oxygen supply systems. Resources for more information are provided.  Flowsheet Row PULMONARY REHAB CHRONIC OBSTRUCTIVE PULMONARY DISEASE from 06/25/2024 in [facility] for Heart, Vascular, & Lung Health  Date 06/25/24  Educator RT  Instruction Review Code 1- Verbalizes Understanding    Breathing Techniques Group instruction that is supported by demonstration and informational handouts. Instructor discusses the benefits of pursed lip and diaphragmatic breathing and detailed demonstration on how to perform both.     Risk Factor Reduction Group instruction that is supported by a PowerPoint presentation. Instructor discusses the  definition of a risk factor, different risk factors for pulmonary disease, and how the heart and lungs work together.   Pulmonary Diseases Group instruction provided by PowerPoint, verbal discussion, and written material to support subject matter. Instructor gives an overview of the different type of pulmonary diseases. There is also a discussion on risk factors and symptoms  as well as ways to manage the diseases.   Stress and Energy Conservation Group instruction provided by PowerPoint, verbal discussion, and written material to support subject matter. Instructor gives an overview of stress and the impact it can have on the body. Instructor also reviews ways to reduce stress. There is also a discussion on energy conservation and ways to conserve energy throughout the day.   Warning Signs and Symptoms Group instruction provided by PowerPoint, verbal discussion, and written material to support subject matter. Instructor reviews warning signs and symptoms of stroke, heart attack, cold and flu. Instructor also reviews ways to prevent the spread of infection.   Other Education Group or individual verbal, written, or video instructions that support the educational goals of the pulmonary rehab program.    Knowledge Questionnaire Score:  Knowledge Questionnaire Score - 05/18/24 1054       Knowledge Questionnaire Score   Pre Score 17/18          Core Components/Risk Factors/Patient Goals at Admission:  Personal Goals and Risk Factors at Admission - 05/18/24 0943       Core Components/Risk Factors/Patient Goals on Admission    Weight Management Weight Gain;Yes    Intervention Weight Management: Provide education and appropriate resources to help participant work on and attain dietary goals.;Weight Management: Develop a combined nutrition and exercise program designed to reach desired caloric intake, while maintaining appropriate intake of nutrient and fiber, sodium and fats, and  appropriate energy expenditure required for the weight goal.    Expected Outcomes Short Term: Continue to assess and modify interventions until short term weight is achieved;Long Term: Adherence to nutrition and physical activity/exercise program aimed toward attainment of established weight goal;Weight Maintenance: Understanding of the daily nutrition guidelines, which includes 25-35% calories from fat, 7% or less cal from saturated fats, less than 200mg  cholesterol, less than 1.5gm of sodium, & 5 or more servings of fruits and vegetables daily;Understanding recommendations for meals to include 15-35% energy as protein, 25-35% energy from fat, 35-60% energy from carbohydrates, less than 200mg  of dietary cholesterol, 20-35 gm of total fiber daily;Understanding of distribution of calorie intake throughout the day with the consumption of 4-5 meals/snacks;Weight Gain: Understanding of general recommendations for a high calorie, high protein meal plan that promotes weight gain by distributing calorie intake throughout the day with the consumption for 4-5 meals, snacks, and/or supplements    Improve shortness of breath with ADL's Yes    Intervention Provide education, individualized exercise plan and daily activity instruction to help decrease symptoms of SOB with activities of daily living.    Expected Outcomes Short Term: Improve cardiorespiratory fitness to achieve a reduction of symptoms when performing ADLs;Long Term: Be able to perform more ADLs without symptoms or delay the onset of symptoms          Core Components/Risk Factors/Patient Goals Review:   Goals and Risk Factor Review     Row Name 05/22/24 0845 06/23/24 0905           Core Components/Risk Factors/Patient Goals Review   Personal Goals Review Weight Management/Obesity;Improve shortness of breath with ADL's;Develop more efficient breathing techniques such as purse lipped breathing and diaphragmatic breathing and practicing self-pacing  with activity. Weight Management/Obesity;Improve shortness of breath with ADL's;Develop more efficient breathing techniques such as purse lipped breathing and diaphragmatic breathing and practicing self-pacing with activity.      Review Monthly review of patients Core Components/Risk Factors/Patient Goals are as follows: Laine is scheduled to start PR on 05/26/24.  Unable to asses her goals at this time. Monthly review of patient's Core Components/Risk Factors/Patient Goals are as follows:  Goal in progress for improving her shortness of breath with ADLs. Iness is trying to build up her strength and endurance. She is exercising on the NuStep and recumbent bike. Her oxygen saturation has been WNL on room air. We are working with Clotilda to decrease her shortness of breath. Goal progressing for developing more efficient breathing techniques such as purse lipped breathing and diaphragmatic breathing; and practicing self-pacing with activity. Catalia needs to be prompted to perform purse lipped breathing while short of breath. We work on this with her while performing the warmup and while exercising. She is working on diaphragmatic breathing at home. Goal progressing for weight gain. Kaylin is working with our dietitian to increase her calorie intake. Debbi will continue to benefit from PR for nutrition, education, exercise, and lifestyle modification.      Expected Outcomes To improve shortness of breath with ADL's, develop more efficient breathing techniques such as purse lipped breathing and diaphragmatic breathing; and practicing self-pacing with activity and gain weight Pt will show progress toward meeting expected goals and outcomes.         Core Components/Risk Factors/Patient Goals at Discharge (Final Review):   Goals and Risk Factor Review - 06/23/24 0905       Core Components/Risk Factors/Patient Goals Review   Personal Goals Review Weight Management/Obesity;Improve shortness of breath with  ADL's;Develop more efficient breathing techniques such as purse lipped breathing and diaphragmatic breathing and practicing self-pacing with activity.    Review Monthly review of patient's Core Components/Risk Factors/Patient Goals are as follows:  Goal in progress for improving her shortness of breath with ADLs. Tamirra is trying to build up her strength and endurance. She is exercising on the NuStep and recumbent bike. Her oxygen saturation has been WNL on room air. We are working with Clotilda to decrease her shortness of breath. Goal progressing for developing more efficient breathing techniques such as purse lipped breathing and diaphragmatic breathing; and practicing self-pacing with activity. Nneka needs to be prompted to perform purse lipped breathing while short of breath. We work on this with her while performing the warmup and while exercising. She is working on diaphragmatic breathing at home. Goal progressing for weight gain. Dixie is working with our dietitian to increase her calorie intake. Taejah will continue to benefit from PR for nutrition, education, exercise, and lifestyle modification.    Expected Outcomes Pt will show progress toward meeting expected goals and outcomes.          ITP Comments:Pt is making expected progress toward Pulmonary Rehab goals after completing 9 session(s). Recommend continued exercise, life style modification, education, and utilization of breathing techniques to increase stamina and strength, while also decreasing shortness of breath with exertion.  Dr. Slater Staff is Medical Director for Pulmonary Rehab at [facility].         [1]  Current Outpatient Medications:    albuterol  (PROVENTIL ) (2.5 MG/3ML) 0.083% nebulizer solution, SMARTSIG:3 Milliliter(s) 3 Times Daily PRN, Disp: , Rfl:    atropine 1 % ophthalmic solution, Apply to eye., Disp: , Rfl:    brimonidine (ALPHAGAN) 0.2 % ophthalmic solution, , Disp: , Rfl:     budesonide -formoterol  (SYMBICORT ) 160-4.5 MCG/ACT inhaler, Take 2 puffs first thing in am and then another 2 puffs about 12 hours later., Disp: 10.2 each, Rfl: 12   buPROPion (WELLBUTRIN SR) 150 MG 12 hr tablet, Take 150 mg by  mouth 2 (two) times daily., Disp: , Rfl:    cyclobenzaprine  (FLEXERIL ) 10 MG tablet, Take 1 tablet (10 mg total) by mouth at bedtime as needed for muscle spasms. Office visit needed for additional refills. 1st notice., Disp: 15 tablet, Rfl: 0   dorzolamide (TRUSOPT) 2 % ophthalmic solution, INSTILL 1 DROP INTO RIGHT EYE TWICE DAILY, Disp: , Rfl:    dorzolamide-timolol (COSOPT) 2-0.5 % ophthalmic solution, SMARTSIG:In Eye(s), Disp: , Rfl:    DULoxetine (CYMBALTA) 20 MG capsule, Take 20 mg by mouth., Disp: , Rfl:    EPINEPHrine  0.3 mg/0.3 mL IJ SOAJ injection, Inject 0.3 mg into the muscle as needed., Disp: , Rfl:    etonogestrel  (NEXPLANON ) 68 MG IMPL implant, 1 each by Subdermal route once., Disp: , Rfl:    fluticasone (FLONASE) 50 MCG/ACT nasal spray, Place 2 sprays into both nostrils daily., Disp: , Rfl:    gabapentin (NEURONTIN) 300 MG capsule, Take 300 mg by mouth 3 (three) times daily as needed (Pain). , Disp: , Rfl:    hydrochlorothiazide (HYDRODIURIL) 12.5 MG tablet, Take 12.5 mg by mouth daily., Disp: , Rfl:    ibuprofen  (ADVIL ,MOTRIN ) 800 MG tablet, Take 800 mg by mouth every 8 (eight) hours as needed for mild pain or moderate pain. , Disp: , Rfl:    levocetirizine (XYZAL) 5 MG tablet, Take 5 mg by mouth daily., Disp: , Rfl:    lidocaine  (LIDODERM ) 5 %, Place 1 patch onto the skin every 12 (twelve) hours as needed (Back pain). Remove & Discard patch within 12 hours or as directed by MD , Disp: , Rfl:    metoprolol  succinate (TOPROL -XL) 25 MG 24 hr tablet, Take 25 mg by mouth daily., Disp: , Rfl:    metoprolol  tartrate (LOPRESSOR ) 25 MG tablet, Take 1 tablet once daily as needed for palpitations., Disp: 180 tablet, Rfl: 3   omeprazole  (PRILOSEC) 40 MG capsule, Take 1  capsule (40 mg total) by mouth daily., Disp: 90 capsule, Rfl: 3   predniSONE  (DELTASONE ) 20 MG tablet, Take 1.5 tablets (30 mg total) by mouth daily with breakfast. (Patient not taking: No sig reported), Disp: 15 tablet, Rfl: 0   pregabalin (LYRICA) 25 MG capsule, Take 25 mg by mouth 3 (three) times daily., Disp: , Rfl:    QULIPTA 60 MG TABS, Take 1 tablet by mouth daily., Disp: , Rfl:    sucralfate  (CARAFATE ) 1 GM/10ML suspension, Take 10 mLs (1 g total) by mouth 2 (two) times daily., Disp: 600 mL, Rfl: 1   Tiotropium Bromide (SPIRIVA  RESPIMAT) 2.5 MCG/ACT AERS, Inhale 2 puffs into the lungs daily., Disp: 4 g, Rfl: 12   tobramycin-dexamethasone  (TOBRADEX) ophthalmic solution, INSTILL 1 DROP INTO AFFECTED EYE(S) BY OPHTHALMIC ROUTE BID x 7 days, Disp: , Rfl:    traMADol  (ULTRAM ) 50 MG tablet, Take 50 mg by mouth 3 (three) times daily as needed., Disp: , Rfl:    Ubrogepant (UBRELVY) 100 MG TABS, Take 100 mg by mouth., Disp: , Rfl:    VENTOLIN  HFA 108 (90 Base) MCG/ACT inhaler, Inhale into the lungs., Disp: , Rfl:    Vitamin D, Ergocalciferol, (DRISDOL) 1.25 MG (50000 UNIT) CAPS capsule, Take 50,000 Units by mouth 2 (two) times a week., Disp: , Rfl:  [2]  Social History Tobacco Use  Smoking Status Every Day   Current packs/day: 0.25   Average packs/day: 0.3 packs/day for 10.0 years (2.5 ttl pk-yrs)   Types: Cigarettes  Smokeless Tobacco Never  Tobacco Comments   Has smoked for 20 years,  05/18/24 down to 2 cigarettes daily

## 2024-07-01 NOTE — Telephone Encounter (Signed)
 Reason for Disposition  [1] SEVERE mouth pain (e.g., excruciating) AND [2] not improved after 2 hours of pain medicine  Protocols used: Mouth Pain-A-AH

## 2024-07-01 NOTE — Telephone Encounter (Signed)
 FYI Only or Action Required?: Action required by provider: clinical question for provider.  Patient was last seen in primary care on .  Called Nurse Triage reporting Oral Pain.  Symptoms began several days ago.  Interventions attempted: Rest, hydration, or home remedies. Has been rinsing moth and brushing teeth.  Symptoms are: gradually worsening.Mouth is sore, tongue. Pain 10/10 when she eats. No patches in mouth. Red.  Triage Disposition: No disposition on file.  Patient/caregiver understands and will follow disposition?: Asking for medicine or mouth wash to be called in. Please advise pt.      Answer Assessment - Initial Assessment Questions 1. ONSET: When did your mouth start hurting? (e.g., hours or days ago)      A few days 2. SEVERITY: How bad is the pain? (Scale 1-10; or mild, moderate or severe)     8 3. SORES: Are there any sores or ulcers in the mouth? If Yes, ask: What part of the mouth are the sores in?     Red, gums hurt, tongue hurts 4. FEVER: Do you have a fever? If Yes, ask: What is your temperature, how was it measured, and when did it start?     no 5. CAUSE: What do you think is causing the mouth pain?     inhalers 6. OTHER SYMPTOMS: Do you have any other symptoms? (e.g., difficulty breathing)     no  Protocols used: Mouth Pain-A-AH

## 2024-07-02 ENCOUNTER — Encounter (HOSPITAL_COMMUNITY)
Admission: RE | Admit: 2024-07-02 | Discharge: 2024-07-02 | Disposition: A | Discharge status: Home / Self Care | Source: Ambulatory Visit

## 2024-07-02 DIAGNOSIS — J449 Chronic obstructive pulmonary disease, unspecified: Secondary | ICD-10-CM | POA: Diagnosis not present

## 2024-07-02 NOTE — Progress Notes (Signed)
 Daily Session Note  Patient Details  Name: Anne Olson MRN: [id number] Date of Birth: Feb 13, 1976 Referring Provider:   Conrad Ports Pulmonary Rehab Walk Test from 05/18/2024 in [facility] for Heart, Vascular, & Lung Health  Referring Provider Pawar    Encounter Date: 07/02/2024  Check In:  Session Check In - 07/02/24 0817       Check-In   Supervising physician immediately available to respond to emergencies [facility] MD immediately available    Physician(s) Barnie Press, NP    Location [facility]    Staff Present Ronal Levin, RN, BSN;Adeline Petitfrere Claudene, Neita Moats, MS, ACSM-CEP, Exercise Physiologist;Randi Midge HECKLE, ACSM-CEP, Exercise Physiologist    Virtual Visit No    Medication changes reported     No    Fall or balance concerns reported    No    Tobacco Cessation No Change    Warm-up and Cool-down Performed as group-led instruction    Resistance Training Performed Yes    VAD Patient? No    PAD/SET Patient? No      Pain Assessment   Currently in Pain? No/denies    Multiple Pain Sites No          Capillary Blood Glucose: No results found for this or any previous visit (from the past 24 hours).    Tobacco Use History[1]  Goals Met:  Proper associated with RPD/PD & O2 Sat Independence with exercise equipment Exercise tolerated well No report of concerns or symptoms today Strength training completed today  Goals Unmet:  Not Applicable  Comments: Service time is from 0811 to 0921.    Dr. Slater Staff is Medical Director for Pulmonary Rehab at [facility].     [1]  Social History Tobacco Use  Smoking Status Every Day   Current packs/day: 0.25   Average packs/day: 0.3 packs/day for 10.0 years (2.5 ttl pk-yrs)   Types: Cigarettes  Smokeless Tobacco Never  Tobacco Comments   Has smoked for 20 years, 05/18/24 down to 2 cigarettes daily

## 2024-07-06 ENCOUNTER — Telehealth (HOSPITAL_COMMUNITY): Payer: Self-pay | Admitting: *Deleted

## 2024-07-06 NOTE — Telephone Encounter (Signed)
 Notified pt that her normal 8:15 Pulmonary Rehab class will be closed due to road conditions tomorrow 1/27. She is unable to attend either of the other two classes.  Aliene Aris BS, ACSM-CEP 07/06/2024 2:28 PM

## 2024-07-07 ENCOUNTER — Encounter (HOSPITAL_COMMUNITY): Payer: Self-pay

## 2024-07-08 ENCOUNTER — Telehealth (HOSPITAL_COMMUNITY): Payer: Self-pay

## 2024-07-08 NOTE — Telephone Encounter (Signed)
 Created in error

## 2024-07-08 NOTE — Telephone Encounter (Signed)
 LVM for pt about the possibility of the elevator not working at [employer]. If able to take stairs then park on the 2nd floor of our parking garage. If unable to take stairs we will reschedule the appointment.

## 2024-07-09 ENCOUNTER — Encounter (HOSPITAL_COMMUNITY)
Admission: RE | Admit: 2024-07-09 | Discharge: 2024-07-09 | Disposition: A | Discharge status: Home / Self Care | Source: Ambulatory Visit

## 2024-07-09 DIAGNOSIS — J449 Chronic obstructive pulmonary disease, unspecified: Secondary | ICD-10-CM | POA: Diagnosis not present

## 2024-07-09 NOTE — Progress Notes (Signed)
 Daily Session Note  Patient Details  Name: Anne Olson MRN: [id number] Date of Birth: Mar 20, 1976 Referring Provider:   Conrad Ports [facility] Rehab Walk Test from 05/18/2024 in [facility]  Referring Provider Pawar    Encounter Date: 07/09/2024  Check In:  Session Check In - 07/09/24 9191       Check-In   Supervising physician immediately available to respond to emergencies [facility] MD immediately available    Physician(s) Josefa Beauvais, NP    Location [facility]    Staff Present Ronal Levin, RN, BSN;Leilanni Halvorson Claudene, Neita Moats, MS, ACSM-CEP, Exercise Physiologist;Randi Midge HECKLE, ACSM-CEP, Exercise Physiologist    Virtual Visit No    Medication changes reported     No    Fall or balance concerns reported    No    Tobacco Cessation No Change    Warm-up and Cool-down Performed as group-led instruction    Resistance Training Performed Yes    VAD Patient? No    PAD/SET Patient? No      Pain Assessment   Currently in Pain? No/denies    Multiple Pain Sites No          Capillary Blood Glucose: No results found for this or any previous visit (from the past 24 hours).    Tobacco Use History[1]  Goals Met:  Proper associated with RPD/PD & O2 Sat Independence with exercise equipment Exercise tolerated well No report of concerns or symptoms today Strength training completed today  Goals Unmet:  Not Applicable  Comments: Service time is from 0806 to [id number].    Dr. Slater Staff is Medical Director for Pulmonary Rehab at [facility].     [1]  Social History Tobacco Use  Smoking Status Every Day   Current packs/day: 0.25   Average packs/day: 0.3 packs/day for 10.0 years (2.5 ttl pk-yrs)   Types: Cigarettes  Smokeless Tobacco Never  Tobacco Comments   Has smoked for 20 years, 05/18/24 down to 2 cigarettes daily

## 2024-07-13 ENCOUNTER — Telehealth (HOSPITAL_COMMUNITY): Payer: Self-pay

## 2024-07-13 NOTE — Telephone Encounter (Signed)
 Called pt about inclement weather. LVM with call back number.

## 2024-07-14 ENCOUNTER — Encounter (HOSPITAL_COMMUNITY): Admission: RE | Admit: 2024-07-14 | Discharge: 2024-07-14 | Disposition: A | Source: Ambulatory Visit

## 2024-07-14 VITALS — Wt 131.0 lb

## 2024-07-14 DIAGNOSIS — J449 Chronic obstructive pulmonary disease, unspecified: Secondary | ICD-10-CM

## 2024-07-14 NOTE — Progress Notes (Signed)
 Daily Session Note  Patient Details  Name: Anne Olson MRN: [id number] Date of Birth: 1976/01/05 Referring Provider:   Conrad Ports [facility]monary Rehab Walk Test from 05/18/2024 in [facility]  Referring Provider Pawar    Encounter Date: 07/14/2024  Check In:  Session Check In - 07/14/24 0810       Check-In   Supervising physician immediately available to respond to emergencies [facility] MD immediately available    Physician(s) Josefa Beauvais, NP    [facility]    Staff Present Ronal Levin, RN, BSN;Casey Claudene, Neita Moats, MS, ACSM-CEP, Exercise Physiologist;Randi Midge HECKLE, ACSM-CEP, Exercise Physiologist    Virtual Visit No    Medication changes reported     No    Fall or balance concerns reported    No    Tobacco Cessation No Change    Warm-up and Cool-down Performed as group-led instruction    Resistance Training Performed Yes    VAD Patient? No    PAD/SET Patient? No      Pain Assessment   Currently in Pain? No/denies          Capillary Blood Glucose: No results found for this or any previous visit (from the past 24 hours).   Exercise Prescription Changes - 07/14/24 0900       Response to Exercise   Blood Pressure (Admit) 138/80    Blood Pressure (Exercise) 142/80    Blood Pressure (Exit) 106/68    Heart Rate (Admit) 103 bpm    Heart Rate (Exercise) 125 bpm    Heart Rate (Exit) 106 bpm    Oxygen Saturation (Admit) 98 %    Oxygen Saturation (Exercise) 98 %    Oxygen Saturation (Exit) 98 %    Rating of Perceived Exertion (Exercise) 11    Perceived Dyspnea (Exercise) 1    Duration Continue with 30 min of aerobic exercise without signs/symptoms of physical distress.    Intensity THRR unchanged      Progression   Progression Continue to progress workloads to maintain intensity without signs/symptoms of physical distress.      Resistance Training   Weight red bands    Reps  10-15    Time 10 Minutes      Recumbant Bike   Level 4    RPM 47    Watts 38    Minutes 15    METs 3.6      NuStep   Level 4    SPM 78    Minutes 15    METs 3.1          Tobacco Use History[1]  Goals Met:  Proper associated with RPD/PD & O2 Sat Independence with exercise equipment Exercise tolerated well No report of concerns or symptoms today Strength training completed today  Goals Unmet:  Not Applicable  Comments: Service time is from 0809 to 0916.    Dr. Slater Staff is Medical Director for Pulmonary Rehab at [facility].     [1]  Social History Tobacco Use  Smoking Status Every Day   Current packs/day: 0.25   Average packs/day: 0.3 packs/day for 10.0 years (2.5 ttl pk-yrs)   Types: Cigarettes  Smokeless Tobacco Never  Tobacco Comments   Has smoked for 20 years, 05/18/24 down to 2 cigarettes daily

## 2024-07-16 ENCOUNTER — Encounter (HOSPITAL_COMMUNITY): Admission: RE | Admit: 2024-07-16 | Discharge: 2024-07-16 | Disposition: A | Payer: Self-pay | Source: Ambulatory Visit

## 2024-07-16 DIAGNOSIS — J449 Chronic obstructive pulmonary disease, unspecified: Secondary | ICD-10-CM

## 2024-07-16 NOTE — Progress Notes (Signed)
 Daily Session Note  Patient Details  Name: Anne Olson MRN: [id number] Date of Birth: 1976/04/11 Referring Provider:   Conrad Ports [facility]monary Rehab Walk Test from 05/18/2024 in [facility]  Referring Provider Pawar    Encounter Date: 07/16/2024  Check In:  Session Check In - 07/16/24 0810       Check-In   Supervising physician immediately available to respond to emergencies [facility] MD immediately available    Physician(s) Orren Fabry, PA    Location [facility]    Staff Present Ronal Levin, RN, BSN;Casey Claudene, Neita Moats, MS, ACSM-CEP, Exercise Physiologist;Randi Midge BS, ACSM-CEP, Exercise Physiologist    Virtual Visit No    Medication changes reported     No    Fall or balance concerns reported    No    Tobacco Cessation No Change    Warm-up and Cool-down Performed as group-led instruction    Resistance Training Performed Yes    VAD Patient? No    PAD/SET Patient? No      Pain Assessment   Currently in Pain? No/denies          Capillary Blood Glucose: No results found for this or any previous visit (from the past 24 hours).    Tobacco Use History[1]  Goals Met:  Proper associated with RPD/PD & O2 Sat Exercise tolerated well No report of concerns or symptoms today Strength training completed today  Goals Unmet:  Not Applicable  Comments: Service time is from 0810 to 0924.    Dr. Slater Staff is Medical Director for Pulmonary Rehab at [facility].     [1]  Social History Tobacco Use  Smoking Status Every Day   Current packs/day: 0.25   Average packs/day: 0.3 packs/day for 10.0 years (2.5 ttl pk-yrs)   Types: Cigarettes  Smokeless Tobacco Never  Tobacco Comments   Has smoked for 20 years, 05/18/24 down to 2 cigarettes daily

## 2024-07-21 ENCOUNTER — Encounter (HOSPITAL_COMMUNITY)

## 2024-07-23 ENCOUNTER — Encounter (HOSPITAL_COMMUNITY): Payer: Self-pay

## 2024-07-28 ENCOUNTER — Encounter (HOSPITAL_COMMUNITY)

## 2024-07-30 ENCOUNTER — Encounter (HOSPITAL_COMMUNITY)

## 2024-08-04 ENCOUNTER — Encounter (HOSPITAL_COMMUNITY): Payer: Self-pay

## 2024-08-05 ENCOUNTER — Ambulatory Visit

## 2024-08-06 ENCOUNTER — Encounter (HOSPITAL_COMMUNITY): Payer: Self-pay

## 2024-08-11 ENCOUNTER — Encounter (HOSPITAL_COMMUNITY)

## 2024-08-13 ENCOUNTER — Encounter (HOSPITAL_COMMUNITY)

## 2024-08-18 ENCOUNTER — Encounter (HOSPITAL_COMMUNITY): Payer: Self-pay

## 2024-08-20 ENCOUNTER — Encounter (HOSPITAL_COMMUNITY): Payer: Self-pay
